# Patient Record
Sex: Male | Born: 1935 | Race: White | Hispanic: No | Marital: Married
Health system: Midwestern US, Community
[De-identification: ages and names within clinical notes are randomized; demographics above are authoritative.]

## PROBLEM LIST (undated history)

## (undated) DIAGNOSIS — K219 Gastro-esophageal reflux disease without esophagitis: Secondary | ICD-10-CM

## (undated) DIAGNOSIS — J309 Allergic rhinitis, unspecified: Secondary | ICD-10-CM

## (undated) DIAGNOSIS — I509 Heart failure, unspecified: Secondary | ICD-10-CM

## (undated) DIAGNOSIS — E7800 Pure hypercholesterolemia, unspecified: Secondary | ICD-10-CM

## (undated) DIAGNOSIS — I10 Essential (primary) hypertension: Secondary | ICD-10-CM

## (undated) DIAGNOSIS — J189 Pneumonia, unspecified organism: Secondary | ICD-10-CM

## (undated) DIAGNOSIS — I4891 Unspecified atrial fibrillation: Secondary | ICD-10-CM

## (undated) DIAGNOSIS — M1990 Unspecified osteoarthritis, unspecified site: Secondary | ICD-10-CM

## (undated) DIAGNOSIS — K759 Inflammatory liver disease, unspecified: Secondary | ICD-10-CM

## (undated) DIAGNOSIS — N289 Disorder of kidney and ureter, unspecified: Secondary | ICD-10-CM

## (undated) DIAGNOSIS — N189 Chronic kidney disease, unspecified: Secondary | ICD-10-CM

## (undated) DIAGNOSIS — I714 Abdominal aortic aneurysm, without rupture, unspecified: Secondary | ICD-10-CM

## (undated) DIAGNOSIS — I7143 Infrarenal abdominal aortic aneurysm, without rupture: Secondary | ICD-10-CM

## (undated) DIAGNOSIS — R1011 Right upper quadrant pain: Secondary | ICD-10-CM

## (undated) HISTORY — DX: Allergic rhinitis, unspecified: J30.9

## (undated) HISTORY — DX: Essential (primary) hypertension: I10

## (undated) HISTORY — PX: TONSILLECTOMY: SUR1361

## (undated) HISTORY — DX: Pneumonia, unspecified organism: J18.9

## (undated) HISTORY — PX: PROSTATE SURGERY: SHX751

## (undated) HISTORY — DX: Unspecified atrial fibrillation: I48.91

## (undated) HISTORY — PX: APPENDECTOMY: SHX54

## (undated) HISTORY — DX: Pure hypercholesterolemia, unspecified: E78.00

---

## 1998-04-26 ENCOUNTER — Inpatient Hospital Stay (HOSPITAL_COMMUNITY): Admission: EM | Admit: 1998-04-26 | Discharge: 1998-04-27 | Payer: Self-pay | Admitting: Emergency Medicine

## 1998-05-20 ENCOUNTER — Ambulatory Visit (HOSPITAL_COMMUNITY): Admission: RE | Admit: 1998-05-20 | Discharge: 1998-05-20 | Payer: Self-pay | Admitting: Gastroenterology

## 1998-07-14 ENCOUNTER — Ambulatory Visit (HOSPITAL_COMMUNITY): Admission: RE | Admit: 1998-07-14 | Discharge: 1998-07-14 | Payer: Self-pay | Admitting: Gastroenterology

## 2000-06-22 ENCOUNTER — Ambulatory Visit (HOSPITAL_COMMUNITY): Admission: RE | Admit: 2000-06-22 | Discharge: 2000-06-22 | Payer: Self-pay | Admitting: Gastroenterology

## 2002-02-05 ENCOUNTER — Encounter: Payer: Self-pay | Admitting: *Deleted

## 2002-02-05 ENCOUNTER — Emergency Department (HOSPITAL_COMMUNITY): Admission: EM | Admit: 2002-02-05 | Discharge: 2002-02-05 | Payer: Self-pay | Admitting: *Deleted

## 2002-07-03 ENCOUNTER — Ambulatory Visit (HOSPITAL_COMMUNITY): Admission: RE | Admit: 2002-07-03 | Discharge: 2002-07-03 | Payer: Self-pay | Admitting: Family Medicine

## 2002-07-03 ENCOUNTER — Encounter: Payer: Self-pay | Admitting: Family Medicine

## 2002-08-06 ENCOUNTER — Encounter: Payer: Self-pay | Admitting: Urology

## 2002-08-09 ENCOUNTER — Encounter: Admission: RE | Admit: 2002-08-09 | Discharge: 2002-08-09 | Payer: Self-pay | Admitting: Family Medicine

## 2002-08-09 ENCOUNTER — Encounter: Payer: Self-pay | Admitting: Family Medicine

## 2002-08-13 ENCOUNTER — Inpatient Hospital Stay (HOSPITAL_COMMUNITY): Admission: RE | Admit: 2002-08-13 | Discharge: 2002-08-15 | Payer: Self-pay | Admitting: Urology

## 2002-08-13 ENCOUNTER — Encounter (INDEPENDENT_AMBULATORY_CARE_PROVIDER_SITE_OTHER): Payer: Self-pay | Admitting: Specialist

## 2002-12-02 ENCOUNTER — Encounter: Payer: Self-pay | Admitting: Urology

## 2002-12-02 ENCOUNTER — Ambulatory Visit (HOSPITAL_COMMUNITY): Admission: RE | Admit: 2002-12-02 | Discharge: 2002-12-02 | Payer: Self-pay | Admitting: Urology

## 2002-12-12 ENCOUNTER — Observation Stay (HOSPITAL_COMMUNITY): Admission: RE | Admit: 2002-12-12 | Discharge: 2002-12-13 | Payer: Self-pay | Admitting: Urology

## 2002-12-16 ENCOUNTER — Ambulatory Visit (HOSPITAL_COMMUNITY): Admission: RE | Admit: 2002-12-16 | Discharge: 2002-12-16 | Payer: Self-pay | Admitting: Radiology

## 2002-12-16 ENCOUNTER — Encounter: Payer: Self-pay | Admitting: Urology

## 2003-03-31 ENCOUNTER — Encounter: Payer: Self-pay | Admitting: *Deleted

## 2003-03-31 ENCOUNTER — Emergency Department (HOSPITAL_COMMUNITY): Admission: EM | Admit: 2003-03-31 | Discharge: 2003-03-31 | Payer: Self-pay | Admitting: Emergency Medicine

## 2003-04-08 ENCOUNTER — Inpatient Hospital Stay (HOSPITAL_COMMUNITY): Admission: RE | Admit: 2003-04-08 | Discharge: 2003-04-12 | Payer: Self-pay | Admitting: Urology

## 2003-04-08 ENCOUNTER — Encounter (INDEPENDENT_AMBULATORY_CARE_PROVIDER_SITE_OTHER): Payer: Self-pay

## 2003-05-30 ENCOUNTER — Encounter: Admission: RE | Admit: 2003-05-30 | Discharge: 2003-05-30 | Payer: Self-pay | Admitting: Family Medicine

## 2003-05-30 ENCOUNTER — Encounter: Payer: Self-pay | Admitting: Family Medicine

## 2003-07-03 ENCOUNTER — Encounter: Admission: RE | Admit: 2003-07-03 | Discharge: 2003-08-11 | Payer: Self-pay | Admitting: Occupational Medicine

## 2003-08-26 ENCOUNTER — Ambulatory Visit (HOSPITAL_COMMUNITY): Admission: RE | Admit: 2003-08-26 | Discharge: 2003-08-26 | Payer: Self-pay | Admitting: Urology

## 2003-08-26 ENCOUNTER — Encounter: Payer: Self-pay | Admitting: Urology

## 2003-10-28 ENCOUNTER — Ambulatory Visit (HOSPITAL_COMMUNITY): Admission: RE | Admit: 2003-10-28 | Discharge: 2003-10-28 | Payer: Self-pay | Admitting: Gastroenterology

## 2003-12-15 ENCOUNTER — Encounter: Admission: RE | Admit: 2003-12-15 | Discharge: 2003-12-15 | Payer: Self-pay | Admitting: Family Medicine

## 2004-01-08 ENCOUNTER — Emergency Department (HOSPITAL_COMMUNITY): Admission: EM | Admit: 2004-01-08 | Discharge: 2004-01-08 | Payer: Self-pay | Admitting: Emergency Medicine

## 2004-01-27 ENCOUNTER — Ambulatory Visit (HOSPITAL_COMMUNITY): Admission: RE | Admit: 2004-01-27 | Discharge: 2004-01-27 | Payer: Self-pay | Admitting: Hematology & Oncology

## 2004-01-27 ENCOUNTER — Encounter (INDEPENDENT_AMBULATORY_CARE_PROVIDER_SITE_OTHER): Payer: Self-pay | Admitting: Specialist

## 2004-02-11 ENCOUNTER — Ambulatory Visit (HOSPITAL_COMMUNITY): Admission: RE | Admit: 2004-02-11 | Discharge: 2004-02-11 | Payer: Self-pay | Admitting: Hematology & Oncology

## 2005-02-09 ENCOUNTER — Ambulatory Visit: Payer: Self-pay | Admitting: Hematology & Oncology

## 2006-03-16 ENCOUNTER — Observation Stay (HOSPITAL_COMMUNITY): Admission: EM | Admit: 2006-03-16 | Discharge: 2006-03-17 | Payer: Self-pay | Admitting: Emergency Medicine

## 2006-06-12 ENCOUNTER — Inpatient Hospital Stay (HOSPITAL_COMMUNITY): Admission: AD | Admit: 2006-06-12 | Discharge: 2006-06-13 | Payer: Self-pay | Admitting: Interventional Cardiology

## 2007-03-02 ENCOUNTER — Encounter: Admission: RE | Admit: 2007-03-02 | Discharge: 2007-03-02 | Payer: Self-pay | Admitting: Family Medicine

## 2007-09-03 ENCOUNTER — Inpatient Hospital Stay (HOSPITAL_COMMUNITY): Admission: EM | Admit: 2007-09-03 | Discharge: 2007-09-04 | Payer: Self-pay | Admitting: Emergency Medicine

## 2007-11-22 ENCOUNTER — Emergency Department (HOSPITAL_COMMUNITY): Admission: EM | Admit: 2007-11-22 | Discharge: 2007-11-22 | Payer: Self-pay | Admitting: Emergency Medicine

## 2009-01-26 ENCOUNTER — Ambulatory Visit: Payer: Self-pay | Admitting: Hematology & Oncology

## 2009-01-27 ENCOUNTER — Ambulatory Visit (HOSPITAL_BASED_OUTPATIENT_CLINIC_OR_DEPARTMENT_OTHER): Admission: RE | Admit: 2009-01-27 | Discharge: 2009-01-27 | Payer: Self-pay | Admitting: Hematology & Oncology

## 2009-01-27 ENCOUNTER — Ambulatory Visit: Payer: Self-pay | Admitting: Diagnostic Radiology

## 2009-01-27 LAB — CBC WITH DIFFERENTIAL (CANCER CENTER ONLY)
BASO#: 0.1 10*3/uL (ref 0.0–0.2)
Eosinophils Absolute: 0.2 10*3/uL (ref 0.0–0.5)
HGB: 14.3 g/dL (ref 13.0–17.1)
LYMPH#: 1.7 10*3/uL (ref 0.9–3.3)
MCH: 32.7 pg (ref 28.0–33.4)
NEUT#: 3.9 10*3/uL (ref 1.5–6.5)
RBC: 4.37 10*6/uL (ref 4.20–5.70)

## 2009-01-29 LAB — COMPREHENSIVE METABOLIC PANEL
Albumin: 4 g/dL (ref 3.5–5.2)
CO2: 22 mEq/L (ref 19–32)
Calcium: 9.3 mg/dL (ref 8.4–10.5)
Glucose, Bld: 90 mg/dL (ref 70–99)
Potassium: 4.5 mEq/L (ref 3.5–5.3)
Sodium: 135 mEq/L (ref 135–145)
Total Protein: 7.6 g/dL (ref 6.0–8.3)

## 2009-01-29 LAB — SPEP & IFE WITH QIG
Albumin ELP: 53.6 % — ABNORMAL LOW (ref 55.8–66.1)
Beta 2: 2.6 % — ABNORMAL LOW (ref 3.2–6.5)
Beta Globulin: 5.1 % (ref 4.7–7.2)
Gamma Globulin: 26.7 % — ABNORMAL HIGH (ref 11.1–18.8)
IgA: 34 mg/dL — ABNORMAL LOW (ref 68–378)
IgG (Immunoglobin G), Serum: 2950 mg/dL — ABNORMAL HIGH (ref 694–1618)

## 2009-01-29 LAB — KAPPA/LAMBDA LIGHT CHAINS: Kappa free light chain: 1.61 mg/dL (ref 0.33–1.94)

## 2009-01-29 LAB — LACTATE DEHYDROGENASE: LDH: 199 U/L (ref 94–250)

## 2009-02-03 LAB — UIFE/LIGHT CHAINS/TP QN, 24-HR UR
Alpha 1, Urine: DETECTED — AB
Alpha 2, Urine: DETECTED — AB
Beta, Urine: DETECTED — AB
Free Kappa Lt Chains,Ur: 2.75 mg/dL — ABNORMAL HIGH (ref 0.04–1.51)
Free Kappa/Lambda Ratio: 0.26 ratio — ABNORMAL LOW (ref 0.46–4.00)
Total Protein, Urine: 22 mg/dL

## 2009-02-26 ENCOUNTER — Encounter: Admission: RE | Admit: 2009-02-26 | Discharge: 2009-02-26 | Payer: Self-pay | Admitting: Family Medicine

## 2009-03-17 ENCOUNTER — Ambulatory Visit: Payer: Self-pay | Admitting: Hematology & Oncology

## 2009-03-17 ENCOUNTER — Ambulatory Visit (HOSPITAL_COMMUNITY): Admission: RE | Admit: 2009-03-17 | Discharge: 2009-03-17 | Payer: Self-pay | Admitting: Hematology & Oncology

## 2009-03-17 ENCOUNTER — Encounter: Payer: Self-pay | Admitting: Hematology & Oncology

## 2009-04-29 ENCOUNTER — Ambulatory Visit: Payer: Self-pay | Admitting: Hematology & Oncology

## 2010-01-13 ENCOUNTER — Ambulatory Visit: Payer: Self-pay | Admitting: Hematology & Oncology

## 2010-10-16 ENCOUNTER — Encounter: Admission: RE | Admit: 2010-10-16 | Discharge: 2010-10-16 | Payer: Self-pay | Admitting: Family Medicine

## 2010-12-11 ENCOUNTER — Encounter: Payer: Self-pay | Admitting: Family Medicine

## 2011-03-02 LAB — DIFFERENTIAL
Basophils Absolute: 0 10*3/uL (ref 0.0–0.1)
Eosinophils Relative: 4 % (ref 0–5)
Lymphocytes Relative: 24 % (ref 12–46)
Lymphs Abs: 1.6 10*3/uL (ref 0.7–4.0)
Monocytes Absolute: 0.7 10*3/uL (ref 0.1–1.0)

## 2011-03-02 LAB — CBC
HCT: 40.4 % (ref 39.0–52.0)
Hemoglobin: 14 g/dL (ref 13.0–17.0)
MCV: 98.1 fL (ref 78.0–100.0)
RDW: 13.6 % (ref 11.5–15.5)

## 2011-04-05 NOTE — Op Note (Signed)
NAMEMAXFIELD, GILDERSLEEVE               ACCOUNT NO.:  [id number]   MEDICAL RECORD NO.:  [id number]          PATIENT TYPE:  AMB   LOCATION:  OMED                         FACILITY:  [facility]   PHYSICIAN:  Rose Phi. Myna Hidalgo, M.D. DATE OF BIRTH:  20-Jun-1931   DATE OF PROCEDURE:  03/17/2009  DATE OF DISCHARGE:                               OPERATIVE REPORT   NATURE OF PROCEDURE:  Left posterior iliac crest bone marrow biopsy and  aspirate.   Mr. Durrell was brought to short stay unit.  He had an IV placed without  difficulty.  His Mallampati score was 2.  His ASA class was 1.   He received 2.5 mg of Versed and 25 mg of Demerol for IV sedation.   The left posterior iliac crest region was prepped and draped in sterile  fashion.  Ten mL of 2% lidocaine was infiltrated under the skin down to  the periosteum.   A #11 scalpel was used to make an incision into the skin.  We obtained a  good bone marrow aspirate without difficulty.  We then obtained a  specimen for flow cytometry and cytogenetics.   Then used the Jamshidi biopsy needle.  We obtained an excellent biopsy  core without complications.   The patient tolerated the procedure well.  There were no complications.      Rose Phi. Myna Hidalgo, M.D.  Electronically Signed     PRE/MEDQ  D:  03/17/2009  T:  03/17/2009  Job:  [id number]

## 2011-04-05 NOTE — Discharge Summary (Signed)
Trevor Burch, Trevor Burch               ACCOUNT NO.:  [id number]   MEDICAL RECORD NO.:  [id number]          PATIENT TYPE:  INP   LOCATION:  4707                         FACILITY:  [facility]   PHYSICIAN:  Jake Bathe, MD      DATE OF BIRTH:  [birth year]   DATE OF ADMISSION:  09/03/2007  DATE OF DISCHARGE:  09/04/2007                               DISCHARGE SUMMARY   DISCHARGE DIAGNOSES:  1. Chest pain resolved.  2. Hypertension.  3. Reflux disease.  4. Renal insufficiency.  5. Long-term medication use.   HOSPITAL COURSE:  Trevor Burch is a 77 year old male patient who was  admitted to [facility] on September 03, 2007 with chest discomfort.  He  stated that the day before his admission he had chest discomfort that  spontaneously went away, that he was awakened about 3 o'clock in the  morning with chest pain and increased pulse rate, fast but regular.  He  states it also feels like sometimes food gets stuck in his throat.  It  is my impression that the patient's discomfort is unlike any he has had  in the past.   He remained in the hospital overnight and ruled out for myocardial  infarction.  Interestingly, his myoglobins were elevated, but his CK-MBs  were not.  He does have a history of weightlifting and has worked out  recently.   We felt after watching him for a 24-hour period that he could go home  and come back in as an outpatient.  Please note the patient does have  renal insufficiency, and in the past we have tried to avoid cardiac  catheterization for this reason.  On this admission, his BUN is 49,  creatinine 2.43.  Other lab work studies include hemoglobin 13.9,  hematocrit 40.2, platelets 210, sodium 131, potassium 3.7, lipase 42.   Chest x-ray:  No active disease.   EKG:  Normal sinus rhythm, rate 60 with no specific ST-T wave changes.   DISCHARGE MEDICATIONS:  1. Amlodipine 5 mg twice a day.  2. Atenolol 25 mg a day.  3. Zegerid daily.  4. Hydrochlorothiazide 25 mg  daily.  5. Sublingual nitroglycerin p.r.n. chest pain.   Increase activity slowly.  Follow up with Dr. Katrinka Blazing for a stress test on  September 17, 2007 at 12 p.m.  He is not to eat 12 hours before the test.  He is not take atenolol within 24 hours of the test.   The patient is discharged to home in stable and improved condition.      Guy Franco, P.A.      Jake Bathe, MD  Electronically Signed    LB/MEDQ  D:  09/04/2007  T:  09/04/2007  Job:  [id number]   cc:   Lyn Records, M.D.

## 2011-04-05 NOTE — Consult Note (Signed)
NAMEFAUSTINO, Trevor Burch NO.:  [id number]   MEDICAL RECORD NO.:  [id number]          PATIENT TYPE:  INP   LOCATION:  1824                         FACILITY:  [facility]   PHYSICIAN:  Jake Bathe, MD      DATE OF BIRTH:  July 19, 1931   DATE OF CONSULTATION:  09/03/2007  DATE OF DISCHARGE:                                 CONSULTATION   PRIMARY CARDIOLOGIST:  Dr. Verdis Prime.   PRIMARY CARE PHYSICIAN:  Dr. Lanell Persons.   REASON FOR CONSULTATION:  [name] is being seen at the request of Dr.  Read Drivers for the evaluation of chest pain.   HISTORY OF PRESENT ILLNESS:  [name] is a 77 year old male patient of  Dr. Verdis Prime with no known prior history of obstructive coronary  disease who was admitted with chest pain.  Approximately 10 years ago he  had a cath by Dr. Daisy Floro which was apparently normal.  Since that time  he had a stress Cardiolite in April of last year which showed no  ischemia.  The episode last year was similar to this episode.   He describes his chest pain as a pulling sensation, almost like a muscle  pull that was substernal with no radiation.  It occurred late yesterday  and spontaneously went away.  He was then awoken at about 3 a.m. with  palpitations, increased pulse rate and this sensation.  He got out of  bed and did feel slightly dizzy.  Currently is chest pain free.   He has yearly endoscopies secondary to his GERD and has been told he has  no strictures.  He states that it feels like sometimes food gets stuck  in his throat.  He denies any recent nausea, vomiting, cough, diarrhea,  skin or hair changes, syncope, edema, weight changes.  He works out  quite heavily with weightlifting.   PAST MEDICAL HISTORY:  1. GERD.  2. Hypertension.  3. Chronic renal insufficiency secondary to hydronephrosis and      dehydration - creatinine 2.5.  4. History of pectoral muscle tear.   ALLERGIES:  No known drug allergies.   MEDICATIONS:  1.  Amlodipine 5 mg twice a day (will clarify this as this is usually a      once-a-day medicine).  2. Atenolol 25 mg once a day.  3. Zegerid 40 mg once a day.  4. Hydrochlorothiazide 25 mg once a day.   FAMILY HISTORY:  Dad died of liver cancer secondary to alcohol abuse.  Mom died in her 62s secondary to old age.  No early family history of  coronary artery disease.   SOCIAL HISTORY:  Occasionally drinks a glass of wine.  Drinks caffeine  but no illicit drug use.  He does not smoke.  He exercises aerobics and  strength training.  He is married with 5 children.  He lives with his  wife who is a IT trainer.  Is retired [place] [employer].   REVIEW OF SYSTEMS:  Unless specified above, all other 12 review of  systems negative.   PHYSICAL EXAMINATION:  VITAL SIGNS:  Blood  pressure 165/95, pulse 90 now  60, respirations 20, temperature 98.  GENERAL:  Alert and oriented x3 in no acute distress, lying in bed with  his wife, pleasant.  EYES:  Well-perfused conjunctivae.  EOMI.  HEENT:  Moist mucous membranes.  NECK:  No carotid bruits.  No thyromegaly.  CARDIOVASCULAR:  Regular rate and rhythm.  No murmurs, rubs or gallops.  LUNGS:  Clear to auscultation bilaterally.  Normal respiratory effort.  ABDOMEN:  Soft, nontender, normoactive bowel sounds.  No epigastric  tenderness.  EXTREMITIES:  No clubbing, cyanosis or edema.  Normal distal pulses.  NEUROLOGIC:  Nonfocal.  No tremors.  SKIN:  Warm, dry and intact.   LABORATORY DATA:  Point of care cardiac biomarkers show myoglobin  greater than 500, 483, 455 with CK-MB of 6.8, 7.2, 5.7 and all three  troponins have been normal, less than 0.05.  White count 6.5, hemoglobin  13.9, hematocrit 40.2, platelets 210,000.  Chemistry:  Sodium 131,  potassium 3.7, BUN 49, creatinine 2.4, glucose 105, lipase 42.   Chest x-ray personally reviewed showed no acute air space disease.   EKG personally reviewed showed normal sinus rhythm, rate 60 with no  specific  changes.   ASSESSMENT AND PLAN:  A 77 year old male with hypertension,  gastroesophageal reflux disease, renal insufficiency with possible acute  coronary syndrome.   1. Possible acute coronary syndrome.  Will continue to cycle cardiac      enzymes.  He has received aspirin, nitroglycerin as needed.  Check      EKG in a.m.  At this point, he is chest pain free and      hemodynamically stable.  If cardiac enzymes continue to be normal,      will likely discharge with outpatient stress test followup.  2. Hypertension.  Continue to monitor on current medications.  Clarify      amlodipine dosage.  3. Gastroesophageal reflux disease.  On Zegerid, which is omeprazole      and sodium bicarbonate.  Yearly endoscopy showed no signs of      stricture.  4. Renal insufficiency, history of hydronephrosis.  Creatinine 2.5.      Will be very prudent with intravenous dye use.      Jake Bathe, MD  Electronically Signed     MCS/MEDQ  D:  09/03/2007  T:  09/03/2007  Job:  [id number]   cc:   Paula Libra, MD  Lyn Records, M.D.

## 2011-04-08 NOTE — H&P (Signed)
Trevor Burch, Trevor Burch               ACCOUNT NO.:  [id number]   MEDICAL RECORD NO.:  [id number]          PATIENT TYPE:  INP   LOCATION:  1827                         FACILITY:  [facility]   PHYSICIAN:  Tylene Fantasia, PA    DATE OF BIRTH:  January 19, 1931   DATE OF ADMISSION:  03/16/2006  DATE OF DISCHARGE:                                HISTORY & PHYSICAL   PRIMARY CARE PHYSICIAN:  Vikki Ports, M.D.   CHIEF COMPLAINT:  Chest discomfort.   HISTORY OF PRESENT ILLNESS:  Trevor Burch is a 77 year old Caucasian male who  was in his usual state of health until 6:45 this morning when he was  awakened from his sleep with crampy mid to substernal chest discomfort which  is still ongoing.  He denies shortness of breath associated with his chest  discomfort, however, admits to diaphoresis.  He also denies nausea,  vomiting, dizziness, and syncope.  Trevor Burch has a history of acid reflux  and took his blood pressure medications as well as Nexium 40 mg orally.  He  was transported to the [facility] Emergency [facility] by EMS.  In route, he  was given sublingual nitroglycerin x1, plus four 81 mg aspirin by EMS  without any change in his chest discomfort.  He now admits to a headache and  nausea secondary to receiving the nitroglycerin.  Upon arrival to the  emergency department, a 12-lead EKG was obtained and revealed sinus  bradycardia with first degree AV block and a ventricular rate of 57 beats  per minute.  This is consistent with a previous EKG that was obtained in  February 2005.  In November 2003, he had a 2D echocardiogram that revealed  normal systolic size and function with an EF of 75%.  The echocardiogram  also revealed mild aortic regurgitation.  He also had a negative exercise  stress test in July 2003.  Per the patient, he had a cardiac catheterization  with normal coronaries greater than 10 years ago with Dr. Soundra Pilon; however,  there were no records in the hospital system of this  cardiac  catheterization.  Initial point of care markers x1 were negative with the  exception of an elevated myoglobin at 357.   PAST MEDICAL HISTORY:  1.  Hypertension.  2.  GERD.  3.  Pectoral muscle tear.  4.  Kidney stones in the distant past.   ALLERGIES:  No known drug allergies.   MEDICATIONS:  1.  Amlodipine 5 mg daily.  2.  Atenolol 25 mg daily.  3.  Nexium 40 mg daily.  4.  HCTZ 25 mg one half tablet daily.   PAST SURGICAL HISTORY:  1.  Status post T&A.  2.  Status post kidney and bladder surgery.   FAMILY HISTORY:  1.  Insignificant for early coronary artery disease.  Father deceased age      94, lung cancer, tobacco abuse.  2.  Mother deceased at age 48 old age, tobacco abuse.  3.  Five adult children, all healthy.   SOCIAL HISTORY:  Married with five adult children.  Lives with his  wife.  He  is retired from the [employer]. 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  He denies tobacco or illicit drug use.  However, admits to occasional alcohol use (2-3 times per week with dinner).  He exercises for one hour daily doing aerobic and strength training.  He  also walks his dog.   REVIEW OF SYSTEMS:  All of the systems reviewed are negative other than what  is stated in the HPI.   PHYSICAL EXAMINATION:  GENERAL APPEARANCE:  A 77 year old male, pleasant and  cooperative.  NAD.  VITAL SIGNS:  Temperature 97.0, blood pressure 134/70, pulse 56,  respirations 20, O2 saturations 97% over 2 liters.  HEENT:  Unremarkable.  NECK:  Supple without JVD or carotid bruits bilaterally.  Carotid upstrokes  2+/2.  LUNGS:  Breath sounds are equal and clear to auscultation bilaterally.  HEART:  Regular rate and rhythm.  S1, S2 normal with a positive S4.  No  murmurs, gallops, clicks or rubs auscultated.  ABDOMEN:  Soft, nontender, nondistended with active bowel sounds.  No masses  or bruits bilaterally.  EXTREMITIES:  Right lower extremity edema.  No  edema on the left.  DP and PT  pulses 2+/2 bilaterally.  SKIN:  Warm and dry without rashes or lesions.  NEUROLOGICAL:  Alert and oriented x3.  No focal deficits.  PSYCHIATRIC:  Normal mood and affect.   LABORATORY DATA:  Sodium 136, potassium 4.3, chloride 102, CO2 24, BUN 53,  creatinine 2.6, glucose 103.  White blood count 5.4.  Hemoglobin 13.1,  platelets 201,000, hematocrit 37.8, PT 13.6, INR 1.0, PTT 27.  Point of care  markers x2.  Myoglobin 357 and 283, respectively.  CK MB 5.5 and 4.3,  respectively.  Troponin I less than 0.05 and less than 0.05, respectively.  Serial cardiac enzymes x1.  CK total 217.  CK MB 9.4, troponin I less than  0.01.  Magnesium 2.0. UA pending, BNP pending.  Chest x-ray, 2 view, NAD,  SL, PROM, pulmonary vasculature.  Mild CE.   ASSESSMENT:  1.  Chest discomfort, questionable cause.  2.  Hypertension.  3.  Renal insufficiency.   PLAN:  1.  Admit to cardiac telemetry unit under the service of Dr. Verdis Prime      with diagnosis of chest pain.  2.  Hold Atenolol.  3.  Hold HCTZ secondary to renal insufficiency.  4.  Start EC aspirin 325 mg tomorrow.  5.  Obtain serial cardiac enzymes including CK total, CK MB and troponin I      now and q.8 h x2.  Continue IV nitroglycerin; however, may discontinue      if serial cardiac enzymes are negative.  6.  Stress Cardiolite is scheduled for the a.m. of March 17, 2006.  7.  N.p.o. after midnight tonight, March 16, 2006.  8.  Subcu Lovenox q.12 h per pharmacy protocol.  9.  IV hydration 0.9% normal saline at 70 ml per hour beginning at 10      o'clock a.m. for 10 hours.  Then KVL.  10. The patient was interviewed and examined by Dr. Katrinka Blazing who assisted in      the assessment and plan for the patient.  Dr.  Katrinka Blazing is in agreement      with the orders that have been written.      Tylene Fantasia, [place]     RDM/MEDQ  D:  03/16/2006  T:  03/16/2006  Job:  [id number]  cc:   Vikki Ports, M.D.  Fax:  [phone]

## 2011-04-08 NOTE — Procedures (Signed)
[facility]  Patient:    Trevor Burch                       MRN: [id number] Proc. Date: 06/22/00 Adm. Date:  [id number] Attending:  Orland Mustard CC:         Vikki Ports, M.D.   Procedure Report  PROCEDURE:  Esophagogastroduodenoscopy.  ENDOSCOPIST:  Llana Aliment. Edwards, M.D.  MEDICATIONS:  Hurricaine spray, fentanyl 50 mcg and Versed 4 mg IV.  INDICATIONS:  Esophageal reflux that has been somewhat refractory to proton pump inhibitor.  DESCRIPTION OF PROCEDURE:  The procedure had been explained to the patient and consent obtained.  With the patient in the left lateral decubitus position, the Olympus video endoscope was inserted blindly and advanced under direct visualization.  The stomach was entered, the pylorus identified and passed. The duodenum including the bulb and second portion were unremarkable.  The scope was withdrawn back in the stomach.  The antrum and body were normal. The fundus and cardia were seen well on retroflexed view and appeared normal. There was a hiatal hernia with a patent GE junction.  The distal esophagus was endoscopically normal and no ulcerations, strictures, etc.  The proximal esophagus was also normal.  The scope was withdrawn.  The patient tolerated the procedure well.  ASSESSMENT:  Hiatal hernia with gastroesophageal reflux disease.  PLAN:  Will continue on Nexium and see back in the office in six months. DD:  06/22/00 TD:  06/23/00 Job: [id number]

## 2011-08-10 LAB — DIFFERENTIAL
Basophils Relative: 0
Eosinophils Absolute: 0
Eosinophils Relative: 0
Monocytes Relative: 9
Neutrophils Relative %: 84 — ABNORMAL HIGH

## 2011-08-10 LAB — URINALYSIS, ROUTINE W REFLEX MICROSCOPIC
Glucose, UA: NEGATIVE
Specific Gravity, Urine: 1.013

## 2011-08-10 LAB — CBC
RBC: 4.42
WBC: 10

## 2011-08-10 LAB — COMPREHENSIVE METABOLIC PANEL
ALT: 28
Alkaline Phosphatase: 64
CO2: 23
Chloride: 101
GFR calc non Af Amer: 22 — ABNORMAL LOW
Glucose, Bld: 163 — ABNORMAL HIGH
Potassium: 4.5
Sodium: 134 — ABNORMAL LOW

## 2011-08-10 LAB — URINE MICROSCOPIC-ADD ON

## 2011-08-10 LAB — POCT CARDIAC MARKERS
Myoglobin, poc: 457
Operator id: 4295

## 2011-09-01 LAB — CK TOTAL AND CKMB (NOT AT ARMC)
CK, MB: 9.6 — ABNORMAL HIGH
Relative Index: 6.8 — ABNORMAL HIGH
Total CK: 141

## 2011-09-01 LAB — DIFFERENTIAL
Basophils Absolute: 0
Eosinophils Relative: 3
Lymphocytes Relative: 21
Lymphs Abs: 1.3
Neutro Abs: 4.3
Neutrophils Relative %: 66

## 2011-09-01 LAB — POCT CARDIAC MARKERS
CKMB, poc: 5.7
CKMB, poc: 6.8
Myoglobin, poc: 455
Myoglobin, poc: >500
Operator id: 294511
Troponin i, poc: <0.05
Troponin i, poc: <0.05

## 2011-09-01 LAB — LIPID PANEL
LDL Cholesterol: 130 — ABNORMAL HIGH
Total CHOL/HDL Ratio: 5.4
VLDL: 19

## 2011-09-01 LAB — CBC
HCT: 40.2
MCHC: 34.7
MCV: 97.4
RBC: 4.13 — ABNORMAL LOW

## 2011-09-01 LAB — COMPREHENSIVE METABOLIC PANEL
AST: 22
BUN: 49 — ABNORMAL HIGH
CO2: 24
Calcium: 9.2
Creatinine, Ser: 2.43 — ABNORMAL HIGH
GFR calc Af Amer: 32 — ABNORMAL LOW
GFR calc non Af Amer: 26 — ABNORMAL LOW
Total Bilirubin: 0.8

## 2011-09-01 LAB — LIPASE, BLOOD: Lipase: 42

## 2012-08-30 ENCOUNTER — Other Ambulatory Visit: Payer: Self-pay

## 2013-03-12 ENCOUNTER — Other Ambulatory Visit: Payer: Self-pay | Admitting: Family Medicine

## 2013-03-12 DIAGNOSIS — E049 Nontoxic goiter, unspecified: Secondary | ICD-10-CM

## 2013-03-13 ENCOUNTER — Ambulatory Visit
Admission: RE | Admit: 2013-03-13 | Discharge: 2013-03-13 | Disposition: A | Discharge status: Home / Self Care | Payer: BC Managed Care – PPO | Source: Ambulatory Visit | Attending: Family Medicine | Admitting: Family Medicine

## 2013-03-13 DIAGNOSIS — E049 Nontoxic goiter, unspecified: Secondary | ICD-10-CM

## 2013-09-30 ENCOUNTER — Encounter (HOSPITAL_COMMUNITY): Payer: Self-pay | Admitting: Emergency Medicine

## 2013-09-30 ENCOUNTER — Inpatient Hospital Stay (HOSPITAL_COMMUNITY)
Admission: EM | Admit: 2013-09-30 | Discharge: 2013-10-02 | DRG: 194 | Disposition: A | Discharge status: Home / Self Care | Payer: BC Managed Care – PPO | Attending: Internal Medicine | Admitting: Internal Medicine

## 2013-09-30 ENCOUNTER — Emergency Department (HOSPITAL_COMMUNITY): Payer: BC Managed Care – PPO

## 2013-09-30 DIAGNOSIS — N184 Chronic kidney disease, stage 4 (severe): Secondary | ICD-10-CM | POA: Diagnosis present

## 2013-09-30 DIAGNOSIS — I129 Hypertensive chronic kidney disease with stage 1 through stage 4 chronic kidney disease, or unspecified chronic kidney disease: Secondary | ICD-10-CM | POA: Diagnosis present

## 2013-09-30 DIAGNOSIS — I4891 Unspecified atrial fibrillation: Secondary | ICD-10-CM | POA: Diagnosis present

## 2013-09-30 DIAGNOSIS — K219 Gastro-esophageal reflux disease without esophagitis: Secondary | ICD-10-CM | POA: Diagnosis present

## 2013-09-30 DIAGNOSIS — Z7901 Long term (current) use of anticoagulants: Secondary | ICD-10-CM

## 2013-09-30 DIAGNOSIS — I2510 Atherosclerotic heart disease of native coronary artery without angina pectoris: Secondary | ICD-10-CM | POA: Diagnosis present

## 2013-09-30 DIAGNOSIS — J189 Pneumonia, unspecified organism: Principal | ICD-10-CM | POA: Diagnosis present

## 2013-09-30 DIAGNOSIS — I10 Essential (primary) hypertension: Secondary | ICD-10-CM | POA: Diagnosis present

## 2013-09-30 DIAGNOSIS — R079 Chest pain, unspecified: Secondary | ICD-10-CM

## 2013-09-30 DIAGNOSIS — L02419 Cutaneous abscess of limb, unspecified: Secondary | ICD-10-CM | POA: Diagnosis present

## 2013-09-30 HISTORY — DX: Gastro-esophageal reflux disease without esophagitis: K21.9

## 2013-09-30 HISTORY — DX: Disorder of kidney and ureter, unspecified: N28.9

## 2013-09-30 LAB — CBC WITH DIFFERENTIAL/PLATELET
Basophils Absolute: 0 10*3/uL (ref 0.0–0.1)
Basophils Relative: 0 % (ref 0–1)
Eosinophils Absolute: 0.1 10*3/uL (ref 0.0–0.7)
Eosinophils Relative: 1 % (ref 0–5)
HCT: 38.2 % — ABNORMAL LOW (ref 39.0–52.0)
Hemoglobin: 13 g/dL (ref 13.0–17.0)
Lymphocytes Relative: 15 % (ref 12–46)
Lymphs Abs: 1.2 10*3/uL (ref 0.7–4.0)
MCH: 33.5 pg (ref 26.0–34.0)
MCHC: 34 g/dL (ref 30.0–36.0)
MCV: 98.5 fL (ref 78.0–100.0)
Monocytes Absolute: 1.1 10*3/uL — ABNORMAL HIGH (ref 0.1–1.0)
Monocytes Relative: 14 % — ABNORMAL HIGH (ref 3–12)
Neutro Abs: 5.8 10*3/uL (ref 1.7–7.7)
Neutrophils Relative %: 71 % (ref 43–77)
Platelets: 187 10*3/uL (ref 150–400)
RBC: 3.88 MIL/uL — ABNORMAL LOW (ref 4.22–5.81)
RDW: 13.4 % (ref 11.5–15.5)
WBC: 8.2 10*3/uL (ref 4.0–10.5)

## 2013-09-30 LAB — BASIC METABOLIC PANEL
CO2: 22 mEq/L (ref 19–32)
Chloride: 105 mEq/L (ref 96–112)
Creatinine, Ser: 2.92 mg/dL — ABNORMAL HIGH (ref 0.50–1.35)
GFR calc Af Amer: 22 mL/min — ABNORMAL LOW (ref >90)
Sodium: 137 mEq/L (ref 135–145)

## 2013-09-30 LAB — TROPONIN I: Troponin I: <0.3 ng/mL (ref <0.30)

## 2013-09-30 MED ORDER — SODIUM CHLORIDE 0.9 % IV SOLN: 250.0000 mL | INTRAVENOUS | Status: DC | PRN

## 2013-09-30 MED ORDER — DEXTROSE 5 % IV SOLN
1.0000 g | INTRAVENOUS | Status: DC
  Administered 2013-10-01: 1 g via INTRAVENOUS
  Filled 2013-09-30 (×2): qty 10

## 2013-09-30 MED ORDER — ATENOLOL 25 MG PO TABS
25.0000 mg | ORAL_TABLET | Freq: Every day | ORAL | Status: DC
  Administered 2013-10-01 – 2013-10-02 (×2): 25 mg via ORAL
  Filled 2013-09-30 (×2): qty 1

## 2013-09-30 MED ORDER — WARFARIN - PHARMACIST DOSING INPATIENT
Freq: Every day | Status: DC
  Administered 2013-10-01: 18:00:00

## 2013-09-30 MED ORDER — ADULT MULTIVITAMIN W/MINERALS CH
1.0000 | ORAL_TABLET | Freq: Every day | ORAL | Status: DC
  Administered 2013-10-01 – 2013-10-02 (×2): 1 via ORAL
  Filled 2013-09-30 (×2): qty 1

## 2013-09-30 MED ORDER — ASPIRIN 325 MG PO TABS
325.0000 mg | ORAL_TABLET | Freq: Once | ORAL | Status: AC
  Administered 2013-09-30: 325 mg via ORAL
  Filled 2013-09-30: qty 1

## 2013-09-30 MED ORDER — WARFARIN VIDEO
Freq: Once | Status: AC
  Administered 2013-10-01: 12:00:00

## 2013-09-30 MED ORDER — DICLOFENAC SODIUM 1 % TD GEL
2.0000 g | Freq: Three times a day (TID) | TRANSDERMAL | Status: DC | PRN
  Filled 2013-09-30: qty 100

## 2013-09-30 MED ORDER — ACETAMINOPHEN 325 MG PO TABS: 650.0000 mg | ORAL_TABLET | Freq: Four times a day (QID) | ORAL | Status: DC | PRN

## 2013-09-30 MED ORDER — WARFARIN SODIUM 5 MG PO TABS
5.0000 mg | ORAL_TABLET | Freq: Once | ORAL | Status: AC
  Administered 2013-09-30: 5 mg via ORAL
  Filled 2013-09-30: qty 1

## 2013-09-30 MED ORDER — AMLODIPINE BESYLATE 5 MG PO TABS
5.0000 mg | ORAL_TABLET | Freq: Two times a day (BID) | ORAL | Status: DC
  Administered 2013-09-30 – 2013-10-02 (×4): 5 mg via ORAL
  Filled 2013-09-30 (×5): qty 1

## 2013-09-30 MED ORDER — HEPARIN BOLUS VIA INFUSION
5000.0000 [IU] | Freq: Once | INTRAVENOUS | Status: AC
  Administered 2013-09-30: 5000 [IU] via INTRAVENOUS
  Filled 2013-09-30: qty 5000

## 2013-09-30 MED ORDER — ALBUTEROL SULFATE (5 MG/ML) 0.5% IN NEBU: 2.5000 mg | INHALATION_SOLUTION | RESPIRATORY_TRACT | Status: DC | PRN

## 2013-09-30 MED ORDER — HEPARIN (PORCINE) IN NACL 100-0.45 UNIT/ML-% IJ SOLN
1400.0000 [IU]/h | INTRAMUSCULAR | Status: DC
  Administered 2013-10-01 – 2013-10-02 (×3): 1400 [IU]/h via INTRAVENOUS
  Filled 2013-09-30 (×5): qty 250

## 2013-09-30 MED ORDER — DEXTROSE 5 % IV SOLN
500.0000 mg | INTRAVENOUS | Status: DC
  Administered 2013-10-01: 500 mg via INTRAVENOUS
  Filled 2013-09-30 (×2): qty 500

## 2013-09-30 MED ORDER — SODIUM CHLORIDE 0.9 % IV SOLN
Freq: Once | INTRAVENOUS | Status: AC
  Administered 2013-09-30: 20:00:00 via INTRAVENOUS

## 2013-09-30 MED ORDER — OMEGA-3 FATTY ACIDS 1000 MG PO CAPS: 2.0000 g | ORAL_CAPSULE | Freq: Every day | ORAL | Status: DC

## 2013-09-30 MED ORDER — OMEGA-3-ACID ETHYL ESTERS 1 G PO CAPS
2.0000 g | ORAL_CAPSULE | Freq: Every day | ORAL | Status: DC
  Administered 2013-10-01 – 2013-10-02 (×2): 2 g via ORAL
  Filled 2013-09-30 (×2): qty 2

## 2013-09-30 MED ORDER — PANTOPRAZOLE SODIUM 40 MG PO TBEC
40.0000 mg | DELAYED_RELEASE_TABLET | Freq: Every day | ORAL | Status: DC
  Administered 2013-10-01 – 2013-10-02 (×3): 40 mg via ORAL
  Filled 2013-09-30 (×3): qty 1

## 2013-09-30 MED ORDER — SODIUM CHLORIDE 0.9 % IJ SOLN
3.0000 mL | Freq: Two times a day (BID) | INTRAMUSCULAR | Status: DC
  Administered 2013-10-01: 3 mL via INTRAVENOUS

## 2013-09-30 MED ORDER — CEFTRIAXONE SODIUM 1 G IJ SOLR
1.0000 g | Freq: Once | INTRAMUSCULAR | Status: AC
  Administered 2013-09-30: 1 g via INTRAVENOUS
  Filled 2013-09-30: qty 10

## 2013-09-30 MED ORDER — COENZYME Q10 50 MG PO CAPS: 1.0000 | ORAL_CAPSULE | Freq: Every day | ORAL | Status: DC

## 2013-09-30 MED ORDER — FUROSEMIDE 80 MG PO TABS
80.0000 mg | ORAL_TABLET | Freq: Two times a day (BID) | ORAL | Status: DC
  Administered 2013-09-30 – 2013-10-02 (×4): 80 mg via ORAL
  Filled 2013-09-30 (×6): qty 1

## 2013-09-30 MED ORDER — COUMADIN BOOK
Freq: Once | Status: AC
  Administered 2013-09-30: 22:00:00
  Filled 2013-09-30: qty 1

## 2013-09-30 MED ORDER — SODIUM CHLORIDE 0.9 % IJ SOLN: 3.0000 mL | INTRAMUSCULAR | Status: DC | PRN

## 2013-09-30 MED ORDER — ONDANSETRON HCL 4 MG/2ML IJ SOLN: 4.0000 mg | Freq: Three times a day (TID) | INTRAMUSCULAR | Status: AC | PRN

## 2013-09-30 MED ORDER — DEXTROSE 5 % IV SOLN
500.0000 mg | Freq: Once | INTRAVENOUS | Status: AC
  Administered 2013-09-30: 500 mg via INTRAVENOUS

## 2013-09-30 MED ORDER — PATIENT'S GUIDE TO USING COUMADIN BOOK
Freq: Once | Status: AC
  Administered 2013-09-30: 22:00:00
  Filled 2013-09-30: qty 1

## 2013-09-30 NOTE — ED Provider Notes (Signed)
CSN: [id number]     Arrival date & time 09/30/13  1625 History   First MD Initiated Contact with Patient 09/30/13 1641     Chief Complaint  Patient presents with  . Irregular Heart Beat   (Consider location/radiation/quality/duration/timing/severity/associated sxs/prior Treatment) HPI Comments: Pt is a 79 y.o. male with Pmhx as above who presents with EKG changes from PCP's office. Pt states he has occasional GERD "flares" with epigastric/chest burning, belching.  Current episode started about 1 week ago, symptoms intermittent, better with sitting upright.  2 days ago also began having SOB, globus sensation. SOB worse laying flat.  No change in chronic LE edema. No LE pain.  No fever, cough, n/v, diaphoresis, palpitations.  NO CP/AB pain currently.   Patient is a 79 y.o. male presenting with GERD and shortness of breath. The history is provided by the patient. No language interpreter was used.  Gastrophageal Reflux This is a recurrent problem. The current episode started more than 1 week ago. The problem occurs daily. The problem has not changed since onset.Associated symptoms include chest pain, abdominal pain and shortness of breath. Pertinent negatives include no headaches. Exacerbated by: laying back. Relieved by: sitting up. He has tried nothing for the symptoms. The treatment provided no relief.  Shortness of Breath Severity:  Moderate Onset quality:  Unable to specify Duration:  2 days Timing:  Constant Progression:  Waxing and waning Chronicity:  New Relieved by:  Sitting up Exacerbated by: laying flat. Ineffective treatments:  None tried Associated symptoms: abdominal pain and chest pain   Associated symptoms: no claudication, no cough, no diaphoresis, no fever, no headaches, no neck pain, no rash, no sore throat, no sputum production, no syncope, no vomiting and no wheezing   Associated symptoms comment:  Globus sensation  Abdominal pain:    Location:  Epigastric   Quality:   Burning   Severity:  Moderate   Onset quality:  Unable to specify   Duration:  1 week   Timing:  Intermittent   Progression:  Waxing and waning   Chronicity:  Recurrent Risk factors: no recent alcohol use, no hx of PE/DVT, no oral contraceptive use, no prolonged immobilization, no recent surgery and no tobacco use     Past Medical History  Diagnosis Date  . Kidney function abnormal     has approx. 20% function - stablized x several years  . GERD (gastroesophageal reflux disease)    Past Surgical History  Procedure Laterality Date  . Appendectomy    . Tonsillectomy    . Prostate surgery      for enlarged prostate and blockages - no cancer   History reviewed. No pertinent family history. History  Substance Use Topics  . Smoking status: Never Smoker   . Smokeless tobacco: Never Used  . Alcohol Use: Yes     Comment: glass wine approx. 6 days week    Review of Systems  Constitutional: Negative for fever, diaphoresis, activity change, appetite change and fatigue.  HENT: Negative for congestion, facial swelling, rhinorrhea, sore throat and trouble swallowing.   Eyes: Negative for photophobia and pain.  Respiratory: Positive for shortness of breath. Negative for cough, sputum production, chest tightness and wheezing.   Cardiovascular: Positive for chest pain. Negative for claudication, leg swelling and syncope.  Gastrointestinal: Positive for abdominal pain. Negative for nausea, vomiting, diarrhea and constipation.  Endocrine: Negative for polydipsia and polyuria.  Genitourinary: Negative for dysuria, urgency, decreased urine volume and difficulty urinating.  Musculoskeletal: Negative for back pain,  gait problem and neck pain.  Skin: Negative for color change, rash and wound.  Allergic/Immunologic: Negative for immunocompromised state.  Neurological: Negative for dizziness, facial asymmetry, speech difficulty, weakness, numbness and headaches.  Psychiatric/Behavioral: Negative  for confusion, decreased concentration and agitation.    Allergies  Review of patient's allergies indicates no known allergies.  Home Medications   No current outpatient prescriptions on file. BP 130/88  Pulse 72  Temp(Src) 98.3 F (36.8 C) (Oral)  Resp 17  Ht 6\' 1"  (1.854 m)  Wt 229 lb 11.5 oz (104.2 kg)  BMI 30.31 kg/m2  SpO2 95% Physical Exam  Constitutional: He is oriented to person, place, and time. He appears well-developed and well-nourished. No distress.  HENT:  Head: Normocephalic and atraumatic.  Mouth/Throat: No oropharyngeal exudate.  Eyes: Pupils are equal, round, and reactive to light.  Neck: Normal range of motion. Neck supple.  Cardiovascular: Normal rate, regular rhythm and normal heart sounds.  Exam reveals no gallop and no friction rub.   No murmur heard. Pulmonary/Chest: Effort normal and breath sounds normal. No respiratory distress. He has no wheezes. He has no rales.  Abdominal: Soft. Bowel sounds are normal. He exhibits no distension and no mass. There is no tenderness. There is no rebound and no guarding.  Musculoskeletal: Normal range of motion. He exhibits no edema and no tenderness.  Neurological: He is alert and oriented to person, place, and time.  Skin: Skin is warm and dry.     Psychiatric: He has a normal mood and affect.    ED Course  Procedures (including critical care time) Labs Review Labs Reviewed  BASIC METABOLIC PANEL - Abnormal; Notable for the following:    BUN 62 (*)    Creatinine, Ser 2.92 (*)    GFR calc non Af Amer 19 (*)    GFR calc Af Amer 22 (*)    All other components within normal limits  CBC WITH DIFFERENTIAL - Abnormal; Notable for the following:    RBC 3.88 (*)    HCT 38.2 (*)    Monocytes Relative 14 (*)    Monocytes Absolute 1.1 (*)    All other components within normal limits  PRO B NATRIURETIC PEPTIDE - Abnormal; Notable for the following:    Pro B Natriuretic peptide (BNP) 6146.0 (*)    All other  components within normal limits  CBC - Abnormal; Notable for the following:    RBC 3.73 (*)    Hemoglobin 12.6 (*)    HCT 36.6 (*)    All other components within normal limits  BASIC METABOLIC PANEL - Abnormal; Notable for the following:    BUN 60 (*)    Creatinine, Ser 2.93 (*)    GFR calc non Af Amer 19 (*)    GFR calc Af Amer 22 (*)    All other components within normal limits  CBC - Abnormal; Notable for the following:    RBC 3.86 (*)    HCT 37.8 (*)    All other components within normal limits  CULTURE, BLOOD (ROUTINE X 2)  CULTURE, BLOOD (ROUTINE X 2)  CULTURE, EXPECTORATED SPUTUM-ASSESSMENT  GRAM STAIN  TROPONIN I  HIV ANTIBODY (ROUTINE TESTING)  TROPONIN I  TROPONIN I  TROPONIN I  PROTIME-INR  PROTIME-INR  HEPARIN LEVEL (UNFRACTIONATED)  LEGIONELLA ANTIGEN, URINE  STREP PNEUMONIAE URINARY ANTIGEN  HEPARIN LEVEL (UNFRACTIONATED)   Imaging Review Dg Chest 2 View  09/30/2013   CLINICAL DATA:  Shortness of breath, abdominal pain.  EXAM: CHEST  2 VIEW  COMPARISON:  02/26/2009  FINDINGS: Patchy bilateral perihilar opacities, right greater than left. Increasing left lower lobe airspace opacities. Findings concerning for pneumonia. Small bilateral pleural effusions noted on the lateral view. Heart is upper limits normal in size. No acute bony abnormality.  IMPRESSION: Patchy bilateral perihilar and left lower lobe opacities or pneumonia. Small bilateral effusions.   Electronically Signed   By: Charlett Nose M.D.   On: 09/30/2013 17:58    EKG Interpretation     Ventricular Rate:  78 PR Interval:    QRS Duration: 85 QT Interval:  378 QTC Calculation: 430 R Axis:   83 Text Interpretation:  Atrial fibrillation Borderline right axis deviation.  Prior EKG w/ sinus rhythm.             MDM   1. New onset atrial fibrillation   2. CAP (community acquired pneumonia)   3. Chest pain   4. Chronic kidney disease, stage IV (severe)   5. Hypertension    Pt is a 79 y.o.  male with Pmhx as above who presents with EKG changes from PCP's office. Pt states he has occasional GERD "flares" with epigastric/chest burning, belching.  Current episode started about 1 week ago, symptoms intermittent, better with sitting upright.  2 days ago also began having SOB, globus sensation. SOB worse laying flat.  No change in chronic LE edema. No LE pain.  No fever, cough, n/v, diaphoresis, palpitations.  NO CP/AB pain currently.  EKG w/ new onset afib with rate in 70's.  Of note, pt being treated for RLE cellulitis on keflex.  CXR w/ patchy BL perihilar opacities & increasing LLL opacity concerning for pna.   BNP >6000 (though has hx of renal dz), trop negative.   PORT score of 100 given age, renal hx, and plerual effusion on XR.  I believe tp would benefit from inpt admission for pna as well as w/u for new onset a fib, ACS r/o.  Rocephin/azithro given.  [facility] will admit.       Shanna Cisco, MD 10/01/13 1350

## 2013-09-30 NOTE — ED Notes (Signed)
Patient with GERD like symptoms which started about a week ago.  Patient reports he has had this before, and goes to his PCP to get an EKG because he is afraid he is having a heart attack.  His symptoms increased and patient reports he feels like a tennis ball is in his throat and it is difficult to breathe.  Patient had an abnormal EKG at his doctors office today and was sent to ED.

## 2013-09-30 NOTE — H&P (Signed)
PATIENT DETAILS Name: Trevor Burch Age: 79 y.o. Sex: male Date of Birth: Apr 23, 1931 Admit Date: 09/30/2013 [id number], Clifton Custard, MD   CHIEF COMPLAINT:  Heartburn  HPI: Trevor Burch is a 79 y.o. male with a Past Medical History of GERD, hypertension, stage IV chronic kidney disease who presents today with the above noted complaint. Per patient, earlier today he had epigastric/chest pain which he describes as burning. There was some associated belching as well. Although the symptoms started approximately a few days ago, it was much worse today. He also describes having transient difficulty swallowing earlier today-this was to both liquids and solids. Because of these symptoms, he went to his primary care practitioner where a EKG was done which showed atrial fibrillation. Patient was then referred to the emergency room. In the ED, a chest x-ray was suggestive of pneumonia, he can also gives a history of having subjective fever with chills yesterday, but denies any cough. He is now being admitted for further evaluation and treatment.  ALLERGIES:  No Known Allergies  PAST MEDICAL HISTORY: Past Medical History  Diagnosis Date  . Kidney function abnormal     has approx. 20% function - stablized x several years  . GERD (gastroesophageal reflux disease)     PAST SURGICAL HISTORY: Past Surgical History  Procedure Laterality Date  . Appendectomy    . Tonsillectomy    . Prostate surgery      for enlarged prostate and blockages - no cancer    MEDICATIONS AT HOME: Prior to Admission medications   Medication Sig Start Date End Date Taking? Authorizing Provider  amLODipine (NORVASC) 5 MG tablet Take 5 mg by mouth 2 (two) times daily. 09/19/13  Yes Historical Provider, MD  atenolol (TENORMIN) 25 MG tablet Take 25 mg by mouth daily. 09/11/13  Yes Historical Provider, MD  cephALEXin (KEFLEX) 500 MG capsule Take 500 mg by mouth 3 (three) times daily. 09/06/13  Yes Historical Provider, MD   Coenzyme Q10 50 MG CAPS Take 1 capsule by mouth daily.   Yes Historical Provider, MD  fish oil-omega-3 fatty acids 1000 MG capsule Take 2 g by mouth daily.   Yes Historical Provider, MD  furosemide (LASIX) 80 MG tablet Take 80 mg by mouth 2 (two) times daily. 09/26/13  Yes Historical Provider, MD  Multiple Vitamin (MULTIVITAMIN WITH MINERALS) TABS tablet Take 1 tablet by mouth daily.   Yes Historical Provider, MD  omeprazole-sodium bicarbonate (ZEGERID) 40-1100 MG per capsule Take 1 capsule by mouth daily before breakfast.  07/24/13  Yes Historical Provider, MD  VOLTAREN 1 % GEL Apply 2 g topically 3 (three) times daily as needed.  07/15/13  Yes Historical Provider, MD    FAMILY HISTORY: History reviewed. No pertinent family history.  SOCIAL HISTORY:  reports that he has never smoked. He has never used smokeless tobacco. He reports that he drinks alcohol. He reports that he does not use illicit drugs.  REVIEW OF SYSTEMS:  Constitutional:   No  weight loss, night sweats,  Fevers, chills, fatigue.  HEENT:    No headaches, Difficulty swallowing,Tooth/dental problems,Sore throat,  No sneezing, itching, ear ache, nasal congestion, post nasal drip,   Cardio-vascular: No  Orthopnea, PND, swelling in lower extremities, anasarca, dizziness, palpitations  GI:  No abdominal pain, nausea, vomiting, diarrhea, change in  bowel habits, loss of appetite  Resp: No shortness of breath with exertion or at rest.  No excess mucus, no productive cough, No non-productive cough,  No coughing up of blood.No  change in color of mucus.No wheezing.No chest wall deformity  Skin:  no rash or lesions.  GU:  no dysuria, change in color of urine, no urgency or frequency.  No flank pain.  Musculoskeletal: No joint pain or swelling.  No decreased range of motion.  No back pain.  Psych: No change in mood or affect. No depression or anxiety.  No memory loss.   PHYSICAL EXAM: Blood pressure 159/98, pulse 106,  temperature 97.7 F (36.5 C), temperature source Oral, resp. rate 21, SpO2 95.00%.  General appearance :Awake, alert, not in any distress. Speech Clear. Not toxic Looking HEENT: Atraumatic and Normocephalic, pupils equally reactive to light and accomodation Neck: supple, no JVD. No cervical lymphadenopathy.  Chest:Good air entry bilaterally, no added sounds  CVS: S1 S2 irregular, no murmurs.  Abdomen: Bowel sounds present, Non tender and not distended with no gaurding, rigidity or rebound. Extremities: B/L Lower Ext shows 1+ edema, both legs are warm to touch Neurology: Awake alert, and oriented X 3, CN II-XII intact, Non focal Skin:No Rash Wounds:N/A  LABS ON ADMISSION:   Recent Labs  09/30/13 1700  NA 137  K 4.7  CL 105  CO2 22  GLUCOSE 90  BUN 62*  CREATININE 2.92*  CALCIUM 9.8   No results found for this basename: AST, ALT, ALKPHOS, BILITOT, PROT, ALBUMIN,  in the last 72 hours No results found for this basename: LIPASE, AMYLASE,  in the last 72 hours  Recent Labs  09/30/13 1700  WBC 8.2  NEUTROABS 5.8  HGB 13.0  HCT 38.2*  MCV 98.5  PLT 187    Recent Labs  09/30/13 1700  TROPONINI <0.30   No results found for this basename: DDIMER,  in the last 72 hours No components found with this basename: POCBNP,    RADIOLOGIC STUDIES ON ADMISSION: Dg Chest 2 View  09/30/2013   CLINICAL DATA:  Shortness of breath, abdominal pain.  EXAM: CHEST  2 VIEW  COMPARISON:  02/26/2009  FINDINGS: Patchy bilateral perihilar opacities, right greater than left. Increasing left lower lobe airspace opacities. Findings concerning for pneumonia. Small bilateral pleural effusions noted on the lateral view. Heart is upper limits normal in size. No acute bony abnormality.  IMPRESSION: Patchy bilateral perihilar and left lower lobe opacities or pneumonia. Small bilateral effusions.   Electronically Signed   By: Charlett Nose M.D.   On: 09/30/2013 17:58     EKG: Independently reviewed.   atrial fibrillation- rate in the 60s- 70s  ASSESSMENT AND PLAN: Present on Admission:  . Community acquired pneumonia - Apart from some subjective fever yesterday, no other symptoms, and empirically start Rocephin and Zithromax. Follow clinical course.   . Chest pain - The patient's symptoms are more suggestive for a GI etiology. However will admit to telemetry and cycle cardiac enzymes given new onset A. fib. We'll check an echocardiogram.   . New onset Atrial fibrillation - EKG confirms A. fib, rate controlled- patient already on atenolol as an outpatient. Which we will continue - Will go ahead and start heparin overlapping Coumadin-as Chadsvasc score of atleast 3 - Patient sees Dr. Garnette Scheuermann, we'll defer to the rounding physician in the a.m. to get a cardiology consultation.  . Gastroesophageal reflux disease - Continue PPI  - If symptoms of heartburn and difficulty swallowing persists, may need inpatient GI evaluation.   . Hypertension - Continue with preadmission medications   . Chronic kidney disease, stage IV (severe) - Creatinine very close to her usual baseline.  On chronic Lasix, does have some mild lower extremity edema.   Further plan will depend as patient's clinical course evolves and further radiologic and laboratory data become available. Patient will be monitored closely.  DVT Prophylaxis: On heparin infusion   Code Status: Full Code  Total time spent for admission equals 45 minutes.  [facility] Pager [phone]  If 7PM-7AM, please contact night-coverage [email or web address] Password [email or web address] 09/30/2013, 8:00 PM

## 2013-09-30 NOTE — Progress Notes (Signed)
ANTICOAGULATION CONSULT NOTE - Initial Consult  Pharmacy Consult for heparin + warfarin Indication: atrial fibrillation  No Known Allergies  Patient Measurements: Height: 6\' 1"  (185.4 cm) Weight: 229 lb 11.5 oz (104.2 kg) IBW/kg (Calculated) : 79.9 Heparin Dosing Weight: 101.2kg  Vital Signs: Temp: 97.9 F (36.6 C) (11/10 2137) Temp src: Oral (11/10 2137) BP: 149/66 mmHg (11/10 2137) Pulse Rate: 71 (11/10 2137)  Labs:  Recent Labs  09/30/13 1700  HGB 13.0  HCT 38.2*  PLT 187  CREATININE 2.92*  TROPONINI <0.30    Estimated Creatinine Clearance: 25.6 ml/min (by C-G formula based on Cr of 2.92).   Medical History: Past Medical History  Diagnosis Date  . Kidney function abnormal     has approx. 20% function - stablized x several years  . GERD (gastroesophageal reflux disease)     Medications:  Prescriptions prior to admission  Medication Sig Dispense Refill  . amLODipine (NORVASC) 5 MG tablet Take 5 mg by mouth 2 (two) times daily.      Marland Kitchen atenolol (TENORMIN) 25 MG tablet Take 25 mg by mouth daily.      . cephALEXin (KEFLEX) 500 MG capsule Take 500 mg by mouth 3 (three) times daily.      . Coenzyme Q10 50 MG CAPS Take 1 capsule by mouth daily.      . fish oil-omega-3 fatty acids 1000 MG capsule Take 2 g by mouth daily.      . furosemide (LASIX) 80 MG tablet Take 80 mg by mouth 2 (two) times daily.      . Multiple Vitamin (MULTIVITAMIN WITH MINERALS) TABS tablet Take 1 tablet by mouth daily.      Marland Kitchen omeprazole-sodium bicarbonate (ZEGERID) 40-1100 MG per capsule Take 1 capsule by mouth daily before breakfast.       . VOLTAREN 1 % GEL Apply 2 g topically 3 (three) times daily as needed.         Assessment: 82 yom presented with heartburn to primary care but was found to be in atrial fibrillation. To start IV heparin and coumadin for anticoagulation. Baseline CBC is WNL, no INR available. No other anticoagulants PTA. Of note, also started on azithromycin which may  increase the INR.  Goal of Therapy:  INR 2-3 Heparin level 0.3-0.7 units/ml Monitor platelets by anticoagulation protocol: Yes   Plan:  1. Heparin bolus 5000 units IV x 1 2. Heparin gtt 1400 units/hr 3. Check an 8 hour heparin level 4. Daily heparin level and CBC 5. INR now - If WNL, will give warfarin 5mg  PO x 1 tonight 6. Daily INR 7. Initiate coumadin education with coumadin book + video  Ayaana Biondo, Drake Leach 09/30/2013,10:01 PM

## 2013-09-30 NOTE — ED Notes (Signed)
[facility] was notified of pt's transfer to [facility]. 

## 2013-09-30 NOTE — Progress Notes (Signed)
PHARMACIST - PHYSICIAN ORDER COMMUNICATION  CONCERNING: P&T Medication Policy on Herbal Medications  DESCRIPTION:  This patient's order for:  Coenzyme Q-10  has been noted.  This product(s) is classified as an "herbal" or natural product. Due to a lack of definitive safety studies or FDA approval, nonstandard manufacturing practices, plus the potential risk of unknown drug-drug interactions while on inpatient medications, the Pharmacy and Therapeutics Committee does not permit the use of "herbal" or natural products of this type within [employer].   ACTION TAKEN: The pharmacy department is unable to verify this order at this time.  The medication has been discontinued while in the hospital. Please reevaluate patient's clinical condition at discharge and address if the herbal or natural product(s) should be resumed at that time.  Nicolette Bang, RPh 09/30/2013 9:59 PM

## 2013-10-01 DIAGNOSIS — I359 Nonrheumatic aortic valve disorder, unspecified: Secondary | ICD-10-CM

## 2013-10-01 DIAGNOSIS — I10 Essential (primary) hypertension: Secondary | ICD-10-CM

## 2013-10-01 LAB — HEPARIN LEVEL (UNFRACTIONATED)
Heparin Unfractionated: 0.38 IU/mL (ref 0.30–0.70)
Heparin Unfractionated: 0.36 IU/mL (ref 0.30–0.70)

## 2013-10-01 LAB — CBC
Hemoglobin: 12.6 g/dL — ABNORMAL LOW (ref 13.0–17.0)
MCH: 33.8 pg (ref 26.0–34.0)
MCH: 33.9 pg (ref 26.0–34.0)
MCHC: 34.7 g/dL (ref 30.0–36.0)
MCV: 97.9 fL (ref 78.0–100.0)
Platelets: 184 10*3/uL (ref 150–400)
RBC: 3.73 MIL/uL — ABNORMAL LOW (ref 4.22–5.81)
RBC: 3.86 MIL/uL — ABNORMAL LOW (ref 4.22–5.81)

## 2013-10-01 LAB — PROTIME-INR
INR: 0.98 (ref 0.00–1.49)
INR: 1 (ref 0.00–1.49)
Prothrombin Time: 13 seconds (ref 11.6–15.2)

## 2013-10-01 LAB — STREP PNEUMONIAE URINARY ANTIGEN: Strep Pneumo Urinary Antigen: NEGATIVE

## 2013-10-01 LAB — TROPONIN I
Troponin I: <0.3 ng/mL (ref <0.30)
Troponin I: <0.3 ng/mL (ref <0.30)

## 2013-10-01 LAB — BASIC METABOLIC PANEL
CO2: 24 mEq/L (ref 19–32)
Calcium: 9.8 mg/dL (ref 8.4–10.5)
Glucose, Bld: 83 mg/dL (ref 70–99)
Potassium: 4.6 mEq/L (ref 3.5–5.1)
Sodium: 141 mEq/L (ref 135–145)

## 2013-10-01 MED ORDER — WARFARIN SODIUM 7.5 MG PO TABS
7.5000 mg | ORAL_TABLET | Freq: Once | ORAL | Status: AC
  Administered 2013-10-01: 7.5 mg via ORAL
  Filled 2013-10-01: qty 1

## 2013-10-01 NOTE — Progress Notes (Signed)
*  PRELIMINARY RESULTS* Echocardiogram 2D Echocardiogram has been performed.  Trevor Burch 10/01/2013, 11:05 AM

## 2013-10-01 NOTE — Progress Notes (Signed)
ANTICOAGULATION CONSULT NOTE - Initial Consult  Pharmacy Consult for heparin + warfarin Indication: atrial fibrillation  No Known Allergies  Patient Measurements: Height: 6\' 1"  (185.4 cm) Weight: 229 lb 11.5 oz (104.2 kg) IBW/kg (Calculated) : 79.9 Heparin Dosing Weight: 101.2kg  Vital Signs: Temp: 98.3 F (36.8 C) (11/11 0547) Temp src: Oral (11/11 0547) BP: 142/88 mmHg (11/11 0547) Pulse Rate: 73 (11/11 0700)  Labs:  Recent Labs  09/30/13 1700 09/30/13 2140 09/30/13 2350 10/01/13 0425 10/01/13 0725  HGB 13.0  --   --  12.6* 13.1  HCT 38.2*  --   --  36.6* 37.8*  PLT 187  --   --  180 184  LABPROT  --   --  12.8  --  13.0  INR  --   --  0.98  --  1.00  HEPARINUNFRC  --   --   --   --  0.38  CREATININE 2.92*  --   --  2.93*  --   TROPONINI <0.30 <0.30  --  <0.30  --     Estimated Creatinine Clearance: 25.5 ml/min (by C-G formula based on Cr of 2.93).   Medical History: Past Medical History  Diagnosis Date  . Kidney function abnormal     has approx. 20% function - stablized x several years  . GERD (gastroesophageal reflux disease)     Medications:  Prescriptions prior to admission  Medication Sig Dispense Refill  . amLODipine (NORVASC) 5 MG tablet Take 5 mg by mouth 2 (two) times daily.      Marland Kitchen atenolol (TENORMIN) 25 MG tablet Take 25 mg by mouth daily.      . cephALEXin (KEFLEX) 500 MG capsule Take 500 mg by mouth 3 (three) times daily.      . Coenzyme Q10 50 MG CAPS Take 1 capsule by mouth daily.      . fish oil-omega-3 fatty acids 1000 MG capsule Take 2 g by mouth daily.      . furosemide (LASIX) 80 MG tablet Take 80 mg by mouth 2 (two) times daily.      . Multiple Vitamin (MULTIVITAMIN WITH MINERALS) TABS tablet Take 1 tablet by mouth daily.      Marland Kitchen omeprazole-sodium bicarbonate (ZEGERID) 40-1100 MG per capsule Take 1 capsule by mouth daily before breakfast.       . VOLTAREN 1 % GEL Apply 2 g topically 3 (three) times daily as needed.          Assessment: 82 yom presented with heartburn to primary care but was found to be in atrial fibrillation. Started IV heparin and coumadin for anticoagulation last night. Heparin level (0.38) is within therapeutic range, INR = 1.0, low as expected after one dose of coumadin 5mg  last night. He is also on D#2 azithromycin which may increase the INR.  Goal of Therapy:  INR 2-3 Heparin level 0.3-0.7 units/ml Monitor platelets by anticoagulation protocol: Yes   Plan:  - Continue heparin infusion 1400 units/hr - Confirmatory heparin level at 1600 - Daily heparin level and CBC - Coumadin 7.5mg  po x 1  - Daily INR  Bayard Hugger, PharmD, BCPS  Clinical Pharmacist  Pager: [phone]   10/01/2013,8:41 AM

## 2013-10-01 NOTE — Progress Notes (Signed)
Pt given information regarding Coumadin; pt also watching Coumadin video at this time; will give Coumadin booklet once available.

## 2013-10-01 NOTE — Care Management Note (Signed)
    Page 1 of 1   10/02/2013     4:38:14 PM   CARE MANAGEMENT NOTE 10/02/2013  Patient:  Trevor Burch, Trevor Burch   Account Number:  [id number]  Date Initiated:  10/01/2013  Documentation initiated by:  Nixie Laube  Subjective/Objective Assessment:   PT ADM ON 10/01/13 WITH AFIB.  PTA, PT INDEPENDENT, LIVES WITH SPOUSE.     Action/Plan:   WILL FOLLOW FOR DISCHARGE NEEDS AS PT PROGRESSES.   Anticipated DC Date:  10/03/2013   Anticipated DC Plan:  HOME/SELF CARE      DC Planning Services  CM consult  Medication Assistance      Choice offered to / List presented to:             Status of service:  Completed, signed off Medicare Important Message given?   (If response is "NO", the following Medicare IM given date fields will be blank) Date Medicare IM given:   Date Additional Medicare IM given:    Discharge Disposition:  HOME/SELF CARE  Per UR Regulation:  Reviewed for med. necessity/level of care/duration of stay  If discussed at Long Length of Stay Meetings, dates discussed:    Comments:  10/02/13 Denita Lun,RN,BSN [phone] PT FOR DC HOME TODAY ON [facility]QU[facility].  30 DAY FREE TRIAL CARD AND COPAY CARD GIVEN TO PT, AS HE HAS BCBS PRIMARY.  PT STATES HE USES MAIL ORDER FOR RX MEDS, AND WILL TRY TO USE COPAY CARD. COPAY FOR 3O DAYS OF ELIQU[facility] RETAIL IS $17, AND $13 FOR 90 DAYS BY MAIL ORDER.

## 2013-10-01 NOTE — Progress Notes (Signed)
ANTICOAGULATION CONSULT NOTE - Follow Up Consult  Pharmacy Consult for Heparin Indication: atrial fibrillation  No Known Allergies  Patient Measurements: Height: 6\' 1"  (185.4 cm) Weight: 229 lb 11.5 oz (104.2 kg) IBW/kg (Calculated) : 79.9 Heparin Dosing Weight: 101kg  Vital Signs: Temp: 98.4 F (36.9 C) (11/11 1345) Temp src: Oral (11/11 1345) BP: 123/74 mmHg (11/11 1345) Pulse Rate: 58 (11/11 1345)  Labs:  Recent Labs  09/30/13 1700 09/30/13 2140 09/30/13 2350 10/01/13 0425 10/01/13 0725 10/01/13 1129 10/01/13 1549  HGB 13.0  --   --  12.6* 13.1  --   --   HCT 38.2*  --   --  36.6* 37.8*  --   --   PLT 187  --   --  180 184  --   --   LABPROT  --   --  12.8  --  13.0  --   --   INR  --   --  0.98  --  1.00  --   --   HEPARINUNFRC  --   --   --   --  0.38  --  0.36  CREATININE 2.92*  --   --  2.93*  --   --   --   TROPONINI <0.30 <0.30  --  <0.30  --  <0.30  --     Estimated Creatinine Clearance: 25.5 ml/min (by C-G formula based on Cr of 2.93).   Medications:  Heparin 1400 units/hr   Assessment: 82yom on heparin bridging to Coumadin for new onset afib. Heparin level (0.36) remains therapeutic - will continue current rate and follow-up AM heparin level. - H/H and Plts stable - No significant bleeding reported  Goal of Therapy:  Heparin level 0.3-0.7 units/ml Monitor platelets by anticoagulation protocol: Yes   Plan:  1. Continue heparin 1400 units/hr (14 ml/hr) 2. Follow-up AM heparin level   Cleon Dew [phone] 10/01/2013,6:37 PM

## 2013-10-01 NOTE — Progress Notes (Signed)
Follow-up:  I was asked to see pt s/p transfer from [facility] to [facility] room 2W-08. Trevor Burch is an 79 y/o gentleman admitted today for CAP, new onset A-Fib, GERD and HTN. At bedside pt noted ambulatory in room washing up at sink w/ assistance. RN reports he has no c/o's since arrival to the floor. VSS and pt is afebrile. He remains in A-Fib w/ a controlled rate in the 70's. Pt transferred to [facility] for cardiology consult. He is a pt of Dr Verdis Prime. Will continue to monitor closely on telemetry.   Leanne Chang, NP-C [facility] Pager [phone]

## 2013-10-01 NOTE — Consult Note (Signed)
Name: Trevor Burch is a 79 y.o. male Admit date: 09/30/2013 Referring Physician:   Primary Physician:  Farris Has Primary Cardiologist:  Gwynneth Albright, M.D.  Reason for Consultation:  Atrial fibrillation with RVR  ASSESSMENT: 1. Atrial fibrillation with rapid ventricular response, symptomatic, and new onset 2. History of non-obstructive coronary artery disease by catheterization 2007 3. Hypertension 4. Chest discomfort likely cardiac in origin related to poor rate control and potential progression of nonobstructive disease identified in 2007.   PLAN: 1. Chronic long-term anticoagulation therapy, given CHADS score greater than 1 2. Rate control and if still symptomatic, we may need to convert to strategy to rhythm control. CI he does once rate is controlled. 3. Agree with IV heparin and rate slowing agents for the time being. 4. I do not believe that a repeat ischemic evaluation is necessary   HPI: On Saturday the patient noted that he was weak and had no energy. He was breathless and had a poorly characterized chest discomfort. He was eventually found to be in atrial fibrillation and referred to the emergency room after being seen in urgent care. He was admitted to the hospital for workup and initiation of therapy to  PMH:   Past Medical History  Diagnosis Date  . Kidney function abnormal     has approx. 20% function - stablized x several years  . GERD (gastroesophageal reflux disease)     PSH:   Past Surgical History  Procedure Laterality Date  . Appendectomy    . Tonsillectomy    . Prostate surgery      for enlarged prostate and blockages - no cancer   Allergies:  Review of patient's allergies indicates no known allergies. Prior to Admit Meds:   Prescriptions prior to admission  Medication Sig Dispense Refill  . amLODipine (NORVASC) 5 MG tablet Take 5 mg by mouth 2 (two) times daily.      Marland Kitchen atenolol (TENORMIN) 25 MG tablet Take 25 mg by mouth daily.        . cephALEXin (KEFLEX) 500 MG capsule Take 500 mg by mouth 3 (three) times daily.      . Coenzyme Q10 50 MG CAPS Take 1 capsule by mouth daily.      . fish oil-omega-3 fatty acids 1000 MG capsule Take 2 g by mouth daily.      . furosemide (LASIX) 80 MG tablet Take 80 mg by mouth 2 (two) times daily.      . Multiple Vitamin (MULTIVITAMIN WITH MINERALS) TABS tablet Take 1 tablet by mouth daily.      Marland Kitchen omeprazole-sodium bicarbonate (ZEGERID) 40-1100 MG per capsule Take 1 capsule by mouth daily before breakfast.       . VOLTAREN 1 % GEL Apply 2 g topically 3 (three) times daily as needed.        Fam HX:   History reviewed. No pertinent family history. Social HX:    History   Social History  . Marital Status: Married    Spouse Name: N/A    Number of Children: N/A  . Years of Education: N/A   Occupational History  . Not on file.   Social History Main Topics  . Smoking status: Never Smoker   . Smokeless tobacco: Never Used  . Alcohol Use: Yes     Comment: glass wine approx. 6 days week  . Drug Use: No  . Sexual Activity: Not on file   Other Topics Concern  . Not on file  Social History Narrative  . No narrative on file     Review of Systems: Acute onset of fatigue as noted above  Physical Exam: Blood pressure 123/74, pulse 58, temperature 98.4 F (36.9 C), temperature source Oral, resp. rate 20, height 6\' 1"  (1.854 m), weight 229 lb 11.5 oz (104.2 kg), SpO2 95.00%. Weight change:   Patient is sitting reading the newspaper  Chest is clear  Neck exam reveals no JVD no carotid bruits  Cardiac exam reveals an irregularly irregular rhythm with a 2 of 6 systolic murmur left midsternal border  Abdomen soft without tenderness. Bowel sounds are normal.  Neurological exam is unremarkable. Extremities reveal no edema. Pulses are intact  Labs: Lab Results  Component Value Date   WBC 7.7 10/01/2013   HGB 13.1 10/01/2013   HCT 37.8* 10/01/2013   MCV 97.9 10/01/2013   PLT 184  10/01/2013    Recent Labs Lab 10/01/13 0425  NA 141  K 4.6  CL 107  CO2 24  BUN 60*  CREATININE 2.93*  CALCIUM 9.8  GLUCOSE 83   No results found for this basename: PTT   Lab Results  Component Value Date   INR 1.00 10/01/2013   INR 0.98 09/30/2013   Lab Results  Component Value Date   CKTOTAL 141 09/03/2007   CKMB 9.6* 09/03/2007   TROPONINI <0.30 10/01/2013     Lab Results  Component Value Date   CHOL  Value: 183        ATP III CLASSIFICATION:  <200     mg/dL   Desirable  657-846  mg/dL   Borderline High  >=962    mg/dL   High 95/28/4132   Lab Results  Component Value Date   HDL 34* 09/04/2007   Lab Results  Component Value Date   LDLCALC  Value: 130        Total Cholesterol/HDL:CHD Risk Coronary Heart Disease Risk Table                     Men   Women  1/2 Average Risk   3.4   3.3* 09/04/2007   Lab Results  Component Value Date   TRIG 95 09/04/2007   Lab Results  Component Value Date   CHOLHDL 5.4 09/04/2007   No results found for this basename: LDLDIRECT      Radiology:  Dg Chest 2 View  09/30/2013   CLINICAL DATA:  Shortness of breath, abdominal pain.  EXAM: CHEST  2 VIEW  COMPARISON:  02/26/2009  FINDINGS: Patchy bilateral perihilar opacities, right greater than left. Increasing left lower lobe airspace opacities. Findings concerning for pneumonia. Small bilateral pleural effusions noted on the lateral view. Heart is upper limits normal in size. No acute bony abnormality.  IMPRESSION: Patchy bilateral perihilar and left lower lobe opacities or pneumonia. Small bilateral effusions.   Electronically Signed   By: Charlett Nose M.D.   On: 09/30/2013 17:58   EKG:  Atrial fibrillation with controlled ventricular response  ECHO: Study Conclusions  - Left ventricle: The cavity size was normal. There was moderate concentric hypertrophy. Systolic function was normal. Wall motion was normal; there were no regional wall motion abnormalities. - Aortic valve:  Moderate thickening and calcification. There was very mild stenosis. Mild regurgitation. Valve area: 1.88cm^2(VTI). Valve area: 1.98cm^2 (Vmax). - Mitral valve: Mild regurgitation. - Left atrium: The atrium was mildly dilated.   Lesleigh Noe 10/01/2013 5:56 PM

## 2013-10-01 NOTE — Progress Notes (Signed)
[facility] PROGRESS NOTE  Trevor Burch WUJ:[id number] DOB: 04-13-1931 DOA: 09/30/2013 PCP: Farris Has, MD  Assessment/Plan: 79 y.o. male with a Past Medical History of GERD, hypertension, stage IV chronic kidney disease who presented with chest , epigastric pain and found to have new a fib in the setting of PNA   1. A fib new, started on IV heparin/coumadin;  -cont BB, cont anticoagulation, d/w cardiology; pend echo   2. Pneumonia; afebrile; no leukocytosis; CXR: Patchy bilateral perihilar and left lower lobe opacities or pneumonia -cont IV atx, oxygen, bronchodilators; pend blood c/s;   3. Chest pain likely GERD; trop neg; cont PPI; pend echo   4. HTN cont home regimen/titrate   5. CKD IV; cont diuresis, stable; monitor     Code Status: full Family Communication: none at the bedside (indicate person spoken with, relationship, and if by phone, the number) Disposition Plan: home when ready    Consultants:  Cardiology   Procedures:  CXR  Antibiotics:  Azithromycin 10/10<<<<<  Ceftriaxone 10/10<<<<<<  (indicate start date, and stop date if known)  HPI/Subjective: Alert  Objective: Filed Vitals:   10/01/13 0700  BP:   Pulse: 73  Temp:   Resp:     Intake/Output Summary (Last 24 hours) at 10/01/13 0859 Last data filed at 10/01/13 0809  Gross per 24 hour  Intake    274 ml  Output      0 ml  Net    274 ml   Filed Weights   09/30/13 2137  Weight: 104.2 kg (229 lb 11.5 oz)    Exam:   General:  alert  Cardiovascular: s1,s2 irregular   Respiratory: few LL crackles   Abdomen: soft, nt, nd   Musculoskeletal: no LE edema   Data Reviewed: Basic Metabolic Panel:  Recent Labs Lab 09/30/13 1700 10/01/13 0425  NA 137 141  K 4.7 4.6  CL 105 107  CO2 22 24  GLUCOSE 90 83  BUN 62* 60*  CREATININE 2.92* 2.93*  CALCIUM 9.8 9.8   Liver Function Tests: No results found for this basename: AST, ALT, ALKPHOS, BILITOT, PROT, ALBUMIN,  in the  last 168 hours No results found for this basename: LIPASE, AMYLASE,  in the last 168 hours No results found for this basename: AMMONIA,  in the last 168 hours CBC:  Recent Labs Lab 09/30/13 1700 10/01/13 0425 10/01/13 0725  WBC 8.2 8.0 7.7  NEUTROABS 5.8  --   --   HGB 13.0 12.6* 13.1  HCT 38.2* 36.6* 37.8*  MCV 98.5 98.1 97.9  PLT 187 180 184   Cardiac Enzymes:  Recent Labs Lab 09/30/13 1700 09/30/13 2140 10/01/13 0425  TROPONINI <0.30 <0.30 <0.30   BNP (last 3 results)  Recent Labs  09/30/13 1700  PROBNP 6146.0*   CBG: No results found for this basename: GLUCAP,  in the last 168 hours  No results found for this or any previous visit (from the past 240 hour(s)).   Studies: Dg Chest 2 View  09/30/2013   CLINICAL DATA:  Shortness of breath, abdominal pain.  EXAM: CHEST  2 VIEW  COMPARISON:  02/26/2009  FINDINGS: Patchy bilateral perihilar opacities, right greater than left. Increasing left lower lobe airspace opacities. Findings concerning for pneumonia. Small bilateral pleural effusions noted on the lateral view. Heart is upper limits normal in size. No acute bony abnormality.  IMPRESSION: Patchy bilateral perihilar and left lower lobe opacities or pneumonia. Small bilateral effusions.   Electronically Signed   By: Caryn Bee  Dover M.D.   On: 09/30/2013 17:58    Scheduled Meds: . amLODipine  5 mg Oral BID  . atenolol  25 mg Oral Daily  . azithromycin  500 mg Intravenous Q24H  . cefTRIAXone (ROCEPHIN)  IV  1 g Intravenous Q24H  . coumadin book   Does not apply Once  . furosemide  80 mg Oral BID  . multivitamin with minerals  1 tablet Oral Daily  . omega-3 acid ethyl esters  2 g Oral Daily  . pantoprazole  40 mg Oral Daily  . patient's guide to using coumadin book   Does not apply Once  . sodium chloride  3 mL Intravenous Q12H  . warfarin  7.5 mg Oral ONCE-1800  . [COMPLETED] warfarin   Does not apply Once  . Warfarin - Pharmacist Dosing Inpatient   Does not apply  q1800   Continuous Infusions: . heparin 1,400 Units/hr (10/01/13 0000)    Principal Problem:   Community acquired pneumonia Active Problems:   Chest pain   Gastroesophageal reflux disease   Hypertension   Chronic kidney disease, stage IV (severe)   Atrial fibrillation    Time spent: > 35 minutes     Esperanza Sheets  [facility] Pager [phone]. If 7PM-7AM, please contact night-coverage at [email or web address], password [employer] 10/01/2013, 8:59 AM  LOS: 1 day

## 2013-10-02 ENCOUNTER — Telehealth: Payer: Self-pay

## 2013-10-02 LAB — CBC
MCHC: 34.1 g/dL (ref 30.0–36.0)
MCV: 99.5 fL (ref 78.0–100.0)
Platelets: 177 10*3/uL (ref 150–400)
RDW: 13.3 % (ref 11.5–15.5)
WBC: 6.9 10*3/uL (ref 4.0–10.5)

## 2013-10-02 LAB — PROTIME-INR: INR: 1.04 (ref 0.00–1.49)

## 2013-10-02 LAB — LEGIONELLA ANTIGEN, URINE

## 2013-10-02 MED ORDER — APIXABAN 2.5 MG PO TABS
2.5000 mg | ORAL_TABLET | Freq: Once | ORAL | Status: AC
  Administered 2013-10-02: 2.5 mg via ORAL
  Filled 2013-10-02: qty 1

## 2013-10-02 MED ORDER — AMIODARONE HCL 200 MG PO TABS
200.0000 mg | ORAL_TABLET | Freq: Once | ORAL | Status: AC
  Administered 2013-10-02: 200 mg via ORAL
  Filled 2013-10-02: qty 1

## 2013-10-02 MED ORDER — WARFARIN SODIUM 2.5 MG PO TABS
12.5000 mg | ORAL_TABLET | Freq: Once | ORAL | Status: DC
  Filled 2013-10-02: qty 1

## 2013-10-02 MED ORDER — APIXABAN 2.5 MG PO TABS: 2.5000 mg | ORAL_TABLET | Freq: Two times a day (BID) | ORAL | Status: DC

## 2013-10-02 MED ORDER — AMIODARONE HCL 200 MG PO TABS: 200.0000 mg | ORAL_TABLET | Freq: Two times a day (BID) | ORAL | Status: DC

## 2013-10-02 MED ORDER — LEVOFLOXACIN 750 MG PO TABS: 750.0000 mg | ORAL_TABLET | Freq: Every day | ORAL | Status: DC

## 2013-10-02 NOTE — Progress Notes (Signed)
10/02/2013 1:06 PM Nursing note Discharge avs form, medications already taken today and those due this evening given and explained to patient and wife. Follow up appointment and when to call MD reviewed. D/c iv line. D/c tele. D/c home with wife per orders.  Uldine Fuster, Blanchard Kelch

## 2013-10-02 NOTE — Telephone Encounter (Signed)
called pt. pt wife aware of appt with Dr.Smith 10/09/13 @4pm . pt wife verbalized understanding

## 2013-10-02 NOTE — Progress Notes (Signed)
ANTICOAGULATION CONSULT NOTE - Initial Consult  Pharmacy Consult for heparin + warfarin Indication: atrial fibrillation  No Known Allergies  Patient Measurements: Height: 6\' 1"  (185.4 cm) Weight: 220 lb 4.8 oz (99.927 kg) IBW/kg (Calculated) : 79.9 Heparin Dosing Weight: 101.2kg  Vital Signs: Temp: 98.2 F (36.8 C) (11/12 0406) Temp src: Oral (11/12 0406) BP: 149/83 mmHg (11/12 0406) Pulse Rate: 73 (11/12 0406)  Labs:  Recent Labs  09/30/13 1700 09/30/13 2140 09/30/13 2350 10/01/13 0425 10/01/13 0725 10/01/13 1129 10/01/13 1549 10/02/13 0450  HGB 13.0  --   --  12.6* 13.1  --   --  12.8*  HCT 38.2*  --   --  36.6* 37.8*  --   --  37.5*  PLT 187  --   --  180 184  --   --  177  LABPROT  --   --  12.8  --  13.0  --   --  13.4  INR  --   --  0.98  --  1.00  --   --  1.04  HEPARINUNFRC  --   --   --   --  0.38  --  0.36 0.38  CREATININE 2.92*  --   --  2.93*  --   --   --   --   TROPONINI <0.30 <0.30  --  <0.30  --  <0.30  --   --     Estimated Creatinine Clearance: 25 ml/min (by C-G formula based on Cr of 2.93).   Medical History: Past Medical History  Diagnosis Date  . Kidney function abnormal     has approx. 20% function - stablized x several years  . GERD (gastroesophageal reflux disease)     Medications:  Prescriptions prior to admission  Medication Sig Dispense Refill  . amLODipine (NORVASC) 5 MG tablet Take 5 mg by mouth 2 (two) times daily.      Marland Kitchen atenolol (TENORMIN) 25 MG tablet Take 25 mg by mouth daily.      . cephALEXin (KEFLEX) 500 MG capsule Take 500 mg by mouth 3 (three) times daily.      . Coenzyme Q10 50 MG CAPS Take 1 capsule by mouth daily.      . fish oil-omega-3 fatty acids 1000 MG capsule Take 2 g by mouth daily.      . furosemide (LASIX) 80 MG tablet Take 80 mg by mouth 2 (two) times daily.      . Multiple Vitamin (MULTIVITAMIN WITH MINERALS) TABS tablet Take 1 tablet by mouth daily.      Marland Kitchen omeprazole-sodium bicarbonate (ZEGERID)  40-1100 MG per capsule Take 1 capsule by mouth daily before breakfast.       . VOLTAREN 1 % GEL Apply 2 g topically 3 (three) times daily as needed.         Assessment: 82 yom with new atrial fibrillation, on IV heparin and coumadin for anticoagulation. Heparin level (0.38) is stable and within therapeutic range, INR = 1.04 has not improved much, He is still on azithromycin. Hgb and Plts are stable.  Goal of Therapy:  INR 2-3 Heparin level 0.3-0.7 units/ml Monitor platelets by anticoagulation protocol: Yes   Plan:  - Continue heparin infusion 1400 units/hr - Coumadin 12.5mg  po x 1  - Daily INR, heparin level and CBC - If goes home, recommend lovenox 100mg  sq Q 24 hrs and INR check Friday  Bayard Hugger, PharmD, BCPS  Clinical Pharmacist  Pager: [phone]   10/02/2013,8:38 AM

## 2013-10-02 NOTE — Progress Notes (Signed)
       Patient Name: Trevor Burch Date of Encounter: 10/02/2013  This is a late entry.   SUBJECTIVE: Patient feels well this morning. His wife is presently a long discussion concerning the management of atrial fibrillation including indication for anticoagulation and the strategy behind rhythm versus rate control. Patient's atrial fibrillation was symptomatic.  TELEMETRY:  Atrial fib control rate.: Filed Vitals:   10/01/13 1345 10/01/13 2045 10/02/13 0406 10/02/13 1015  BP: 123/74 134/68 149/83 142/93  Pulse: 58 64 73 61  Temp: 98.4 F (36.9 C) 98.7 F (37.1 C) 98.2 F (36.8 C)   TempSrc: Oral Oral Oral   Resp: 20 20 20    Height:      Weight:   220 lb 4.8 oz (99.927 kg)   SpO2: 95% 97% 98%     Intake/Output Summary (Last 24 hours) at 10/02/13 1430 Last data filed at 10/02/13 1244  Gross per 24 hour  Intake 1122.66 ml  Output   1800 ml  Net -677.34 ml    LABS: Basic Metabolic Panel:  Recent Labs  47/82/95 1700 10/01/13 0425  NA 137 141  K 4.7 4.6  CL 105 107  CO2 22 24  GLUCOSE 90 83  BUN 62* 60*  CREATININE 2.92* 2.93*  CALCIUM 9.8 9.8   CBC:  Recent Labs  09/30/13 1700  10/01/13 0725 10/02/13 0450  WBC 8.2  < > 7.7 6.9  NEUTROABS 5.8  --   --   --   HGB 13.0  < > 13.1 12.8*  HCT 38.2*  < > 37.8* 37.5*  MCV 98.5  < > 97.9 99.5  PLT 187  < > 184 177  < > = values in this interval not displayed. Cardiac Enzymes:  Recent Labs  09/30/13 2140 10/01/13 0425 10/01/13 1129  TROPONINI <0.30 <0.30 <0.30     Radiology/Studies:  No new data  Physical Exam: Blood pressure 142/93, pulse 61, temperature 98.2 F (36.8 C), temperature source Oral, resp. rate 20, height 6\' 1"  (1.854 m), weight 220 lb 4.8 oz (99.927 kg), SpO2 98.00%. Weight change: -9 lb 6.7 oz (-4.273 kg)   Irregularly irregular rhythm  ASSESSMENT:  1. Atrial fibrillation with controlled rate. Because of symptoms that developed with the onset of atrial fib, we will try to  achieve rhythm control. 2. Chronic anticoagulation with Eliquis 2.5 mg twice a day  Plan:  1. Okay to discontinue IV heparin 2. Amiodarone 200 mg twice a day 3. Anticoagulation as listed above 4. Office visit in 5 days  Selinda Eon 10/02/2013, 2:30 PM

## 2013-10-02 NOTE — Progress Notes (Signed)
10/02/2013 9:27 AM Nursing note RN noted pt. HR ranging 56-65 Afib. MD on floor and made aware. Verbal orders received from Dr. York Spaniel ok to administer Atenolol 25 mg PO as scheduled. Will continue to closely monitor patient.

## 2013-10-02 NOTE — Discharge Summary (Signed)
Physician Discharge Summary  JESSUP OGAS UJW:[id number] DOB: 02/18/1931 DOA: 09/30/2013  PCP: Farris Has, MD  Admit date: 09/30/2013 Discharge date: 10/02/2013  Time spent: >35 minutes  Recommendations for Outpatient Follow-up:  F/u with Dr. Katrinka Blazing in 1 week Discharge Diagnoses:  Principal Problem:   Community acquired pneumonia Active Problems:   Chest pain   Gastroesophageal reflux disease   Hypertension   Chronic kidney disease, stage IV (severe)   Atrial fibrillation   Discharge Condition: stable   Diet recommendation: heart healthy   Filed Weights   09/30/13 2137 10/02/13 0406  Weight: 104.2 kg (229 lb 11.5 oz) 99.927 kg (220 lb 4.8 oz)    History of present illness:  79 y.o. male with a Past Medical History of GERD, hypertension, stage IV chronic kidney disease who presented with chest , epigastric pain and found to have new a fib in the setting of PNA   Hospital Course:  1. A fib new, HR controlled on BB, added amiodarone per cardiology; echo: normal LV function;  -patient was started on IV heparin/coumadin; d/w cardiology who recommended Eliques, d/w with pahrmacy to agree on dose based on CKD;  -f/u with cardiology outpatient  2. Pneumonia; afebrile; no leukocytosis; CXR: Patchy bilateral perihilar and left lower lobe opacities or pneumonia  -improved on IV atx,  blood c/s: NTD; changed to PO levofloxacin  3. Chest pain likely GERD; trop neg; cont PPI; no further test per cardiology att this time   4. HTN cont home regimen/titrate  5. CKD IV; cont diuresis, stable;   Procedures: Echo: Left ventricle: The cavity size was normal. There was moderate concentric hypertrophy. Systolic function was normal. Wall motion was normal; there were no regional wall motion abnormalities. - Aortic valve: Moderate thickening and calcification. There was very mild stenosis. Mild regurgitation. Valve area: 1.88cm^2(VTI). Valve area: 1.98cm^2 (Vmax). - Mitral valve: Mild  regurgitation. - Left atrium: The atrium was mildly dilated.     Consultations:   Discharge Exam: Filed Vitals:   10/02/13 1015  BP: 142/93  Pulse: 61  Temp:   Resp:     General: alert Cardiovascular: s1,s2 rrr Respiratory: CTA BL   Discharge Instructions  Discharge Orders   Future Appointments Provider Department Dept Phone   03/03/2014 3:00 PM Lesleigh Noe, MD [facility] [phone]   Future Orders Complete By Expires   Diet - low sodium heart healthy  As directed    Discharge instructions  As directed    Comments:     Please follow up with Dr. Katrinka Blazing cardiology in 1 week   Increase activity slowly  As directed        Medication List    STOP taking these medications       cephALEXin 500 MG capsule  Commonly known as:  KEFLEX      TAKE these medications       amiodarone 200 MG tablet  Commonly known as:  PACERONE  Take 1 tablet (200 mg total) by mouth 2 (two) times daily.     amLODipine 5 MG tablet  Commonly known as:  NORVASC  Take 5 mg by mouth 2 (two) times daily.     apixaban 2.5 MG Tabs tablet  Commonly known as:  ELIQUIS  Take 1 tablet (2.5 mg total) by mouth 2 (two) times daily.     atenolol 25 MG tablet  Commonly known as:  TENORMIN  Take 25 mg by mouth daily.     Coenzyme Q10  50 MG Caps  Take 1 capsule by mouth daily.     fish oil-omega-3 fatty acids 1000 MG capsule  Take 2 g by mouth daily.     furosemide 80 MG tablet  Commonly known as:  LASIX  Take 80 mg by mouth 2 (two) times daily.     levofloxacin 750 MG tablet  Commonly known as:  LEVAQUIN  Take 1 tablet (750 mg total) by mouth daily.     multivitamin with minerals Tabs tablet  Take 1 tablet by mouth daily.     omeprazole-sodium bicarbonate 40-1100 MG per capsule  Commonly known as:  ZEGERID  Take 1 capsule by mouth daily before breakfast.     VOLTAREN 1 % Gel  Generic drug:  diclofenac sodium  Apply 2 g topically 3 (three) times daily as  needed.       No Known Allergies     Follow-up Information   Follow up with Lesleigh Noe, MD. Schedule an appointment as soon as possible for a visit in 1 week.   Specialty:  Cardiology   Contact information:   [address] [place] [address] [phone]        The results of significant diagnostics from this hospitalization (including imaging, microbiology, ancillary and laboratory) are listed below for reference.    Significant Diagnostic Studies: Dg Chest 2 View  09/30/2013   CLINICAL DATA:  Shortness of breath, abdominal pain.  EXAM: CHEST  2 VIEW  COMPARISON:  02/26/2009  FINDINGS: Patchy bilateral perihilar opacities, right greater than left. Increasing left lower lobe airspace opacities. Findings concerning for pneumonia. Small bilateral pleural effusions noted on the lateral view. Heart is upper limits normal in size. No acute bony abnormality.  IMPRESSION: Patchy bilateral perihilar and left lower lobe opacities or pneumonia. Small bilateral effusions.   Electronically Signed   By: Charlett Nose M.D.   On: 09/30/2013 17:58    Microbiology: Recent Results (from the past 240 hour(s))  CULTURE, BLOOD (ROUTINE X 2)     Status: None   Collection Time    09/30/13 11:45 PM      Result Value Range Status   Specimen Description BLOOD LEFT ARM   Final   Special Requests BOTTLES DRAWN AEROBIC AND ANAEROBIC 5CC EA   Final   Culture  Setup Time     Final   Value: 10/01/2013 02:35     Performed at [facility]   Culture     Final   Value:        BLOOD CULTURE RECEIVED NO GROWTH TO DATE CULTURE WILL BE HELD FOR 5 DAYS BEFORE ISSUING A FINAL NEGATIVE REPORT     Performed at [facility]   Report Status PENDING   Incomplete  CULTURE, BLOOD (ROUTINE X 2)     Status: None   Collection Time    09/30/13 11:50 PM      Result Value Range Status   Specimen Description BLOOD RIGHT ARM   Final   Special Requests BOTTLES DRAWN AEROBIC AND  ANAEROBIC 5CC EA   Final   Culture  Setup Time     Final   Value: 10/01/2013 02:35     Performed at [facility]   Culture     Final   Value:        BLOOD CULTURE RECEIVED NO GROWTH TO DATE CULTURE WILL BE HELD FOR 5 DAYS BEFORE ISSUING A FINAL NEGATIVE REPORT     Performed  at [facility]   Report Status PENDING   Incomplete     Labs: Basic Metabolic Panel:  Recent Labs Lab 09/30/13 1700 10/01/13 0425  NA 137 141  K 4.7 4.6  CL 105 107  CO2 22 24  GLUCOSE 90 83  BUN 62* 60*  CREATININE 2.92* 2.93*  CALCIUM 9.8 9.8   Liver Function Tests: No results found for this basename: AST, ALT, ALKPHOS, BILITOT, PROT, ALBUMIN,  in the last 168 hours No results found for this basename: LIPASE, AMYLASE,  in the last 168 hours No results found for this basename: AMMONIA,  in the last 168 hours CBC:  Recent Labs Lab 09/30/13 1700 10/01/13 0425 10/01/13 0725 10/02/13 0450  WBC 8.2 8.0 7.7 6.9  NEUTROABS 5.8  --   --   --   HGB 13.0 12.6* 13.1 12.8*  HCT 38.2* 36.6* 37.8* 37.5*  MCV 98.5 98.1 97.9 99.5  PLT 187 180 184 177   Cardiac Enzymes:  Recent Labs Lab 09/30/13 1700 09/30/13 2140 10/01/13 0425 10/01/13 1129  TROPONINI <0.30 <0.30 <0.30 <0.30   BNP: BNP (last 3 results)  Recent Labs  09/30/13 1700  PROBNP 6146.0*   CBG: No results found for this basename: GLUCAP,  in the last 168 hours     Signed:  Jonette Mate N  [facility] 10/02/2013, 10:34 AM

## 2013-10-07 LAB — CULTURE, BLOOD (ROUTINE X 2): Culture: NO GROWTH

## 2013-10-08 ENCOUNTER — Encounter: Payer: Self-pay | Admitting: *Deleted

## 2013-10-08 ENCOUNTER — Encounter: Payer: Self-pay | Admitting: Interventional Cardiology

## 2013-10-09 ENCOUNTER — Ambulatory Visit (INDEPENDENT_AMBULATORY_CARE_PROVIDER_SITE_OTHER): Payer: BC Managed Care – PPO | Admitting: Interventional Cardiology

## 2013-10-09 ENCOUNTER — Encounter: Payer: Self-pay | Admitting: Interventional Cardiology

## 2013-10-09 VITALS — BP 140/80 | HR 63 | Ht 72.0 in | Wt 226.0 lb

## 2013-10-09 DIAGNOSIS — I5032 Chronic diastolic (congestive) heart failure: Secondary | ICD-10-CM | POA: Insufficient documentation

## 2013-10-09 DIAGNOSIS — N184 Chronic kidney disease, stage 4 (severe): Secondary | ICD-10-CM

## 2013-10-09 DIAGNOSIS — Z7901 Long term (current) use of anticoagulants: Secondary | ICD-10-CM

## 2013-10-09 DIAGNOSIS — I10 Essential (primary) hypertension: Secondary | ICD-10-CM

## 2013-10-09 DIAGNOSIS — I4891 Unspecified atrial fibrillation: Secondary | ICD-10-CM

## 2013-10-09 MED ORDER — AMIODARONE HCL 200 MG PO TABS: 200.0000 mg | ORAL_TABLET | Freq: Every day | ORAL | Status: DC

## 2013-10-09 MED ORDER — APIXABAN 2.5 MG PO TABS: 2.5000 mg | ORAL_TABLET | Freq: Two times a day (BID) | ORAL | Status: DC

## 2013-10-09 NOTE — Progress Notes (Signed)
Patient ID: Trevor Burch, male   DOB: 10-12-1931, 79 y.o.   MRN: [id number]    [address] [place], [place]  [address] Phone: [phone] Fax:  [phone]  Date:  10/09/2013   ID:  Trevor Burch, DOB [birth year], MRN [id number]  PCP:  Farris Has, MD   ASSESSMENT:  1. Atrial fibrillation of unknown duration with rate control on a timolol and amiodarone 2. Chronic oral anticoagulation, Eliquis 3. Hypertension 4. Nonobstructive coronary disease 5. Stage IV chronic kidney disease  PLAN:  1. Decrease amiodarone to 200 mg per day on 10/14/13 2. Continue anticoagulation 3. Return to office visit in 2 weeks, and if still in atrial fibrillation, we will set up elective electrical cardioversion after a total of 4 weeks anticoagulation   SUBJECTIVE: Trevor Burch is a 79 y.o. male who is feeling better. He was admitted recently with atrial fibrillation of unknown duration. There was dyspnea and fatigue. He was started on medications to slow his rate as well as amiodarone to serve as a rhythm control agent encase electrical cardioversion he comes necessary. His rate is, under good control, his energy is improved, and exertional dyspnea has decreased. There is no peripheral edema. He denies orthopnea PND. He has been somewhat unsteady on his feet and difficulty with his equilibrium. This predated the current therapy although it has been more intense since discharge from the hospital.   Wt Readings from Last 3 Encounters:  10/09/13 226 lb (102.513 kg)  10/02/13 220 lb 4.8 oz (99.927 kg)     Past Medical History  Diagnosis Date  . Kidney function abnormal     has approx. 20% function - stablized x several years  . GERD (gastroesophageal reflux disease)   . Hypertension   . Atrial fibrillation   . Community acquired pneumonia   . Hypercholesteremia   . Allergic rhinitis     Current Outpatient Prescriptions  Medication Sig Dispense Refill  . amiodarone  (PACERONE) 200 MG tablet Take 1 tablet (200 mg total) by mouth 2 (two) times daily.  60 tablet  0  . amLODipine (NORVASC) 5 MG tablet Take 5 mg by mouth 2 (two) times daily.      Marland Kitchen apixaban (ELIQUIS) 2.5 MG TABS tablet Take 1 tablet (2.5 mg total) by mouth 2 (two) times daily.  60 tablet  2  . atenolol (TENORMIN) 25 MG tablet Take 25 mg by mouth daily.      . CEPHALEXIN PO Take by mouth. As directed      . Coenzyme Q10 50 MG CAPS Take 1 capsule by mouth daily.      . fish oil-omega-3 fatty acids 1000 MG capsule Take 2 g by mouth daily.      . furosemide (LASIX) 80 MG tablet Take 80 mg by mouth 2 (two) times daily.      . Multiple Vitamin (MULTIVITAMIN WITH MINERALS) TABS tablet Take 1 tablet by mouth daily.      Marland Kitchen omeprazole-sodium bicarbonate (ZEGERID) 40-1100 MG per capsule Take 1 capsule by mouth daily before breakfast.       . VOLTAREN 1 % GEL Apply 2 g topically 3 (three) times daily as needed.        No current facility-administered medications for this visit.    Allergies:   No Known Allergies  Social History:  The patient  reports that he has never smoked. He has never used smokeless tobacco. He reports that he drinks alcohol. He reports  that he does not use illicit drugs.   ROS:  Please see the history of present illness.   No melena, hematuria, hemoptysis, or falls. Denies abdominal discomfort and transient neurological symptoms. No chest pain.   All other systems reviewed and negative.   OBJECTIVE: VS:  BP 140/80  Pulse 63  Ht 6' (1.829 m)  Wt 226 lb (102.513 kg)  BMI 30.64 kg/m2 Well nourished, well developed, in no acute distress, and appears congruent with age HEENT: normal Neck: JVD flat. Carotid bruit absent  Cardiac:  normal S1, S2; RRR; no murmur Lungs:  clear to auscultation bilaterally, no wheezing, rhonchi or rales Abd: soft, nontender, no hepatomegaly Ext: Edema absent. Pulses 2+ Skin: warm and dry Neuro:  CNs 2-12 intact, no focal abnormalities noted  EKG:   Atrial fibrillation with ventricular response at 63 beats per minute. Right axis.       Signed, Darci Needle III, MD 10/09/2013 4:57 PM

## 2013-10-09 NOTE — Patient Instructions (Addendum)
Decrease Amiodarone to 200mg  daily starting 10/14/13  Continue to take all other medications as prescribed  A refill of Eliquis has been sent to your mail order pharmacy  You have a 2 week follow up appt 10/23/13 @ 3:45pm

## 2013-10-21 ENCOUNTER — Other Ambulatory Visit: Payer: Self-pay | Admitting: Family Medicine

## 2013-10-21 ENCOUNTER — Ambulatory Visit
Admission: RE | Admit: 2013-10-21 | Discharge: 2013-10-21 | Disposition: A | Discharge status: Home / Self Care | Payer: Medicare Other | Source: Ambulatory Visit | Attending: Family Medicine | Admitting: Family Medicine

## 2013-10-21 DIAGNOSIS — R0602 Shortness of breath: Secondary | ICD-10-CM

## 2013-10-23 ENCOUNTER — Encounter: Payer: Self-pay | Admitting: Interventional Cardiology

## 2013-10-23 ENCOUNTER — Ambulatory Visit (INDEPENDENT_AMBULATORY_CARE_PROVIDER_SITE_OTHER): Payer: BC Managed Care – PPO | Admitting: Interventional Cardiology

## 2013-10-23 VITALS — BP 140/61 | HR 63 | Ht 72.0 in | Wt 239.0 lb

## 2013-10-23 DIAGNOSIS — I4891 Unspecified atrial fibrillation: Secondary | ICD-10-CM

## 2013-10-23 DIAGNOSIS — Z7901 Long term (current) use of anticoagulants: Secondary | ICD-10-CM

## 2013-10-23 DIAGNOSIS — I5032 Chronic diastolic (congestive) heart failure: Secondary | ICD-10-CM

## 2013-10-23 DIAGNOSIS — I10 Essential (primary) hypertension: Secondary | ICD-10-CM

## 2013-10-23 MED ORDER — AMIODARONE HCL 200 MG PO TABS: 200.0000 mg | ORAL_TABLET | Freq: Two times a day (BID) | ORAL | Status: DC

## 2013-10-23 MED ORDER — FUROSEMIDE 80 MG PO TABS: 120.0000 mg | ORAL_TABLET | Freq: Two times a day (BID) | ORAL | Status: DC

## 2013-10-23 NOTE — Progress Notes (Signed)
Patient ID: Trevor Burch, male   DOB: [birth year], 79 y.o.   MRN: [id number]    [address] [place], [place]  [address] Phone: [phone] Fax:  [phone]  Date:  10/23/2013   ID:  Trevor Burch, DOB May 09, 1931, MRN [id number]  PCP:  Darrow Bussing, MD   ASSESSMENT:  1. Persistent atrial fibrillation with controlled ventricular response 2. Acute on chronic diastolic heart failure 3. Chronic renal insufficiency, patient has one functioning kidney 4. Blood pressure is borderline control  PLAN:  1. increase amiodarone to 200 mg twice a day 2. Increase furosemide from 80 mg twice a day to 120 mg twice a day 3. Arrange cardioversion for 7-10 days from this date. The electrical cardioversion and attendant risks were discussed with the patient and his wife in detail. We discussed the need for anesthesia and the possibility of mechanical injury. Will also discuss the greater than 90% success rate but were frank about the possibility of a failed procedure. We discussed anesthesia related complications 4. Basic metabolic panel in 4 days 5. The patient should call if lightheadedness, dizziness, or generally feeling worse than he does now.   SUBJECTIVE: Trevor Burch is a 79 y.o. male who complains of dyspnea and fatigue since being discharged from the hospital. He has gained nearly 20 pounds since discharge. He is in atrial fibrillation and is loading with amiodarone. He denies chest discomfort. He gives out of energy and become short of breath with activity.   Wt Readings from Last 3 Encounters:  10/23/13 239 lb (108.41 kg)  10/09/13 226 lb (102.513 kg)  10/02/13 220 lb 4.8 oz (99.927 kg)     Past Medical History  Diagnosis Date  . Kidney function abnormal     has approx. 20% function - stablized x several years  . GERD (gastroesophageal reflux disease)   . Hypertension   . Atrial fibrillation   . Community acquired pneumonia   . Hypercholesteremia   . Allergic  rhinitis     Current Outpatient Prescriptions  Medication Sig Dispense Refill  . amiodarone (PACERONE) 200 MG tablet Take 1 tablet (200 mg total) by mouth 2 (two) times daily.      Marland Kitchen amLODipine (NORVASC) 5 MG tablet Take 5 mg by mouth 2 (two) times daily.      Marland Kitchen amoxicillin-clavulanate (AUGMENTIN) 875-125 MG per tablet       . apixaban (ELIQUIS) 2.5 MG TABS tablet Take 1 tablet (2.5 mg total) by mouth 2 (two) times daily.  180 tablet  3  . atenolol (TENORMIN) 25 MG tablet Take 25 mg by mouth daily.      . CEPHALEXIN PO Take by mouth. As directed      . Coenzyme Q10 50 MG CAPS Take 1 capsule by mouth daily.      . fish oil-omega-3 fatty acids 1000 MG capsule Take 2 g by mouth daily.      . furosemide (LASIX) 80 MG tablet Take 1.5 tablets (120 mg total) by mouth 2 (two) times daily.      . Multiple Vitamin (MULTIVITAMIN WITH MINERALS) TABS tablet Take 1 tablet by mouth daily.      Marland Kitchen omeprazole-sodium bicarbonate (ZEGERID) 40-1100 MG per capsule Take 1 capsule by mouth daily before breakfast.       . VOLTAREN 1 % GEL Apply 2 g topically 3 (three) times daily as needed.        No current facility-administered medications for this visit.  Allergies:   No Known Allergies  Social History:  The patient  reports that he has never smoked. He has never used smokeless tobacco. He reports that he drinks alcohol. He reports that he does not use illicit drugs.   ROS:  Please see the history of present illness.   No bleeding complications on anticoagulation therapy. Denies neurological complaints. Has gained daily 20 pounds since discharge from the hospital.   All other systems reviewed and negative.   OBJECTIVE: VS:  BP 140/61  Pulse 63  Ht 6' (1.829 m)  Wt 239 lb (108.41 kg)  BMI 32.41 kg/m2 Well nourished, well developed, in no acute distress HEENT: normal Neck: JVD moderate elevation. Carotid bruit absent  Cardiac:  normal S1, S2; IIRR; no murmur Lungs:  clear to auscultation bilaterally,  no wheezing, rhonchi or rales Abd: soft, nontender, no hepatomegaly Ext: Edema  2+ bilateral . Pulses 2+  Skin: warm and dry Neuro:  CNs 2-12 intact, no focal abnormalities noted  EKG:  Not performed       Signed, Darci Needle III, MD 10/23/2013 4:45 PM

## 2013-10-23 NOTE — Patient Instructions (Signed)
Increase Lasix to 120mg  twice daily  Increase Amiodarone to 200mg  twice daily  Your physician recommends that you return for lab work on 10/28/13 (bmet)  Your physician has recommended that you have a Cardioversion (DCCV). Electrical Cardioversion uses a jolt of electricity to your heart either through paddles or wired patches attached to your chest. This is a controlled, usually prescheduled, procedure. Defibrillation is done under light anesthesia in the hospital, and you usually go home the day of the procedure. This is done to get your heart back into a normal rhythm. You are not awake for the procedure. Please see the instruction sheet given to you today.( scheduled for 11/01/13 @ 11am)

## 2013-10-25 ENCOUNTER — Other Ambulatory Visit: Payer: Self-pay | Admitting: Interventional Cardiology

## 2013-10-25 ENCOUNTER — Encounter: Payer: Self-pay | Admitting: Interventional Cardiology

## 2013-10-25 DIAGNOSIS — I4891 Unspecified atrial fibrillation: Secondary | ICD-10-CM

## 2013-10-28 ENCOUNTER — Telehealth: Payer: Self-pay | Admitting: Nurse Practitioner

## 2013-10-28 ENCOUNTER — Other Ambulatory Visit (INDEPENDENT_AMBULATORY_CARE_PROVIDER_SITE_OTHER): Payer: BC Managed Care – PPO

## 2013-10-28 DIAGNOSIS — I4891 Unspecified atrial fibrillation: Secondary | ICD-10-CM

## 2013-10-28 DIAGNOSIS — I5032 Chronic diastolic (congestive) heart failure: Secondary | ICD-10-CM

## 2013-10-28 LAB — BASIC METABOLIC PANEL
CO2: 25 mEq/L (ref 19–32)
GFR: 13.94 mL/min — CL (ref >60.00)
Glucose, Bld: 104 mg/dL — ABNORMAL HIGH (ref 70–99)
Potassium: 4.5 mEq/L (ref 3.5–5.1)
Sodium: 137 mEq/L (ref 135–145)

## 2013-10-28 NOTE — Telephone Encounter (Signed)
Received call from lab of critical GFR.  I printed BMET and took to Dr. Jens Som, DOD who advised patient hold evening and morning Lasix and notify Dr. Katrinka Blazing of advice.  Dr. Katrinka Blazing will return to office tomorrow.   I spoke with patient's wife, Steward Drone who states patient is in the shower.  I notified Steward Drone of Dr. Ludwig Clarks advice and that someone from our office will call tomorrow once Dr. Katrinka Blazing has reviewed the labs and plan.  Steward Drone verbalized understanding and repeated instructions back to me.  I am sending message to Dr. Katrinka Blazing and his primary CMA, [facility].

## 2013-10-29 ENCOUNTER — Telehealth: Payer: Self-pay

## 2013-10-29 DIAGNOSIS — I4891 Unspecified atrial fibrillation: Secondary | ICD-10-CM

## 2013-10-29 NOTE — Telephone Encounter (Signed)
Dr.Smith reveiewed pt labs. called pt with Dr.Smith instructions. Hold asix through 10/31/13. pt to come into the office for lab(bmet) 10/31/13.pt will be given further instructions.pending lab results. pt dccv sch for 11/01/13 is on as scheduled.pt verbalized understanding.

## 2013-10-30 ENCOUNTER — Other Ambulatory Visit: Payer: BC Managed Care – PPO

## 2013-10-31 ENCOUNTER — Telehealth: Payer: Self-pay

## 2013-10-31 ENCOUNTER — Other Ambulatory Visit (INDEPENDENT_AMBULATORY_CARE_PROVIDER_SITE_OTHER): Payer: BC Managed Care – PPO

## 2013-10-31 ENCOUNTER — Inpatient Hospital Stay (HOSPITAL_COMMUNITY)
Admission: AD | Admit: 2013-10-31 | Discharge: 2013-11-04 | DRG: 291 | Disposition: A | Discharge status: Home / Self Care | Payer: BC Managed Care – PPO | Source: Ambulatory Visit | Attending: Interventional Cardiology | Admitting: Interventional Cardiology

## 2013-10-31 DIAGNOSIS — I120 Hypertensive chronic kidney disease with stage 5 chronic kidney disease or end stage renal disease: Secondary | ICD-10-CM | POA: Diagnosis present

## 2013-10-31 DIAGNOSIS — N186 End stage renal disease: Secondary | ICD-10-CM | POA: Diagnosis present

## 2013-10-31 DIAGNOSIS — I10 Essential (primary) hypertension: Secondary | ICD-10-CM

## 2013-10-31 DIAGNOSIS — I5033 Acute on chronic diastolic (congestive) heart failure: Secondary | ICD-10-CM

## 2013-10-31 DIAGNOSIS — IMO0001 Reserved for inherently not codable concepts without codable children: Principal | ICD-10-CM | POA: Diagnosis present

## 2013-10-31 DIAGNOSIS — I509 Heart failure, unspecified: Secondary | ICD-10-CM | POA: Diagnosis present

## 2013-10-31 DIAGNOSIS — E7800 Pure hypercholesterolemia, unspecified: Secondary | ICD-10-CM | POA: Diagnosis present

## 2013-10-31 DIAGNOSIS — I440 Atrioventricular block, first degree: Secondary | ICD-10-CM | POA: Diagnosis present

## 2013-10-31 DIAGNOSIS — Z7901 Long term (current) use of anticoagulants: Secondary | ICD-10-CM

## 2013-10-31 DIAGNOSIS — Z79899 Other long term (current) drug therapy: Secondary | ICD-10-CM

## 2013-10-31 DIAGNOSIS — I4891 Unspecified atrial fibrillation: Secondary | ICD-10-CM

## 2013-10-31 DIAGNOSIS — N184 Chronic kidney disease, stage 4 (severe): Secondary | ICD-10-CM

## 2013-10-31 DIAGNOSIS — K219 Gastro-esophageal reflux disease without esophagitis: Secondary | ICD-10-CM | POA: Diagnosis present

## 2013-10-31 DIAGNOSIS — N139 Obstructive and reflux uropathy, unspecified: Secondary | ICD-10-CM | POA: Diagnosis present

## 2013-10-31 DIAGNOSIS — N179 Acute kidney failure, unspecified: Secondary | ICD-10-CM

## 2013-10-31 HISTORY — DX: Inflammatory liver disease, unspecified: K75.9

## 2013-10-31 HISTORY — DX: Heart failure, unspecified: I50.9

## 2013-10-31 HISTORY — DX: Unspecified osteoarthritis, unspecified site: M19.90

## 2013-10-31 HISTORY — DX: Chronic kidney disease, unspecified: N18.9

## 2013-10-31 LAB — BASIC METABOLIC PANEL
CO2: 26 mEq/L (ref 19–32)
Chloride: 102 mEq/L (ref 96–112)
Creatinine, Ser: 4.4 mg/dL — ABNORMAL HIGH (ref 0.4–1.5)
Glucose, Bld: 94 mg/dL (ref 70–99)
Potassium: 4.6 mEq/L (ref 3.5–5.1)
Sodium: 136 mEq/L (ref 135–145)

## 2013-10-31 LAB — COMPREHENSIVE METABOLIC PANEL
ALT: 27 U/L (ref 0–53)
AST: 24 U/L (ref 0–37)
Albumin: 2.9 g/dL — ABNORMAL LOW (ref 3.5–5.2)
Alkaline Phosphatase: 86 U/L (ref 39–117)
BUN: 72 mg/dL — ABNORMAL HIGH (ref 6–23)
Calcium: 9.5 mg/dL (ref 8.4–10.5)
Chloride: 100 mEq/L (ref 96–112)
Potassium: 4.3 mEq/L (ref 3.5–5.1)
Sodium: 134 mEq/L — ABNORMAL LOW (ref 135–145)
Total Bilirubin: 0.5 mg/dL (ref 0.3–1.2)
Total Protein: 7.3 g/dL (ref 6.0–8.3)

## 2013-10-31 LAB — MAGNESIUM: Magnesium: 2.5 mg/dL (ref 1.5–2.5)

## 2013-10-31 LAB — PROTIME-INR: Prothrombin Time: 14.8 seconds (ref 11.6–15.2)

## 2013-10-31 LAB — PRO B NATRIURETIC PEPTIDE: Pro B Natriuretic peptide (BNP): 7072 pg/mL — ABNORMAL HIGH (ref 0–450)

## 2013-10-31 MED ORDER — AMLODIPINE BESYLATE 5 MG PO TABS
5.0000 mg | ORAL_TABLET | Freq: Two times a day (BID) | ORAL | Status: DC
  Administered 2013-10-31 – 2013-11-04 (×8): 5 mg via ORAL
  Filled 2013-10-31 (×9): qty 1

## 2013-10-31 MED ORDER — ONDANSETRON HCL 4 MG/2ML IJ SOLN: 4.0000 mg | Freq: Four times a day (QID) | INTRAMUSCULAR | Status: DC | PRN

## 2013-10-31 MED ORDER — HYDROCORTISONE 1 % EX CREA
1.0000 "application " | TOPICAL_CREAM | Freq: Three times a day (TID) | CUTANEOUS | Status: DC | PRN
  Filled 2013-10-31: qty 28

## 2013-10-31 MED ORDER — SODIUM CHLORIDE 0.9 % IJ SOLN
3.0000 mL | Freq: Two times a day (BID) | INTRAMUSCULAR | Status: DC
  Administered 2013-10-31 – 2013-11-03 (×4): 3 mL via INTRAVENOUS

## 2013-10-31 MED ORDER — SODIUM CHLORIDE 0.9 % IJ SOLN
3.0000 mL | Freq: Two times a day (BID) | INTRAMUSCULAR | Status: DC
  Administered 2013-11-01 – 2013-11-03 (×2): 3 mL via INTRAVENOUS

## 2013-10-31 MED ORDER — APIXABAN 2.5 MG PO TABS
2.5000 mg | ORAL_TABLET | Freq: Two times a day (BID) | ORAL | Status: DC
  Administered 2013-10-31 – 2013-11-04 (×8): 2.5 mg via ORAL
  Filled 2013-10-31 (×9): qty 1

## 2013-10-31 MED ORDER — ACETAMINOPHEN 325 MG PO TABS: 650.0000 mg | ORAL_TABLET | ORAL | Status: DC | PRN

## 2013-10-31 MED ORDER — OMEGA-3 FATTY ACIDS 1000 MG PO CAPS: 2.0000 g | ORAL_CAPSULE | Freq: Every day | ORAL | Status: DC

## 2013-10-31 MED ORDER — AMIODARONE HCL 200 MG PO TABS
200.0000 mg | ORAL_TABLET | Freq: Two times a day (BID) | ORAL | Status: DC
  Administered 2013-10-31 – 2013-11-04 (×8): 200 mg via ORAL
  Filled 2013-10-31 (×9): qty 1

## 2013-10-31 MED ORDER — ADULT MULTIVITAMIN W/MINERALS CH
1.0000 | ORAL_TABLET | Freq: Every day | ORAL | Status: DC
  Administered 2013-10-31 – 2013-11-04 (×4): 1 via ORAL
  Filled 2013-10-31 (×5): qty 1

## 2013-10-31 MED ORDER — SODIUM CHLORIDE 0.9 % IV SOLN
250.0000 mL | INTRAVENOUS | Status: DC
  Administered 2013-10-31: 22:00:00 250 mL via INTRAVENOUS

## 2013-10-31 MED ORDER — DEXTROSE 5 % IV SOLN
120.0000 mg | Freq: Two times a day (BID) | INTRAVENOUS | Status: AC
  Administered 2013-10-31 – 2013-11-01 (×2): 120 mg via INTRAVENOUS
  Filled 2013-10-31 (×3): qty 12

## 2013-10-31 MED ORDER — SODIUM CHLORIDE 0.9 % IJ SOLN: 3.0000 mL | INTRAMUSCULAR | Status: DC | PRN

## 2013-10-31 MED ORDER — PANTOPRAZOLE SODIUM 40 MG PO TBEC
40.0000 mg | DELAYED_RELEASE_TABLET | Freq: Every day | ORAL | Status: DC
  Administered 2013-10-31 – 2013-11-04 (×5): 40 mg via ORAL
  Filled 2013-10-31 (×3): qty 1

## 2013-10-31 MED ORDER — OMEGA-3-ACID ETHYL ESTERS 1 G PO CAPS
2.0000 g | ORAL_CAPSULE | Freq: Every day | ORAL | Status: DC
  Administered 2013-10-31 – 2013-11-04 (×5): 2 g via ORAL
  Filled 2013-10-31 (×5): qty 2

## 2013-10-31 MED ORDER — SODIUM CHLORIDE 0.9 % IV SOLN: 250.0000 mL | INTRAVENOUS | Status: DC | PRN

## 2013-10-31 MED ORDER — ATENOLOL 25 MG PO TABS
25.0000 mg | ORAL_TABLET | Freq: Every day | ORAL | Status: DC
  Administered 2013-11-01 – 2013-11-04 (×4): 25 mg via ORAL
  Filled 2013-10-31 (×4): qty 1

## 2013-10-31 NOTE — Telephone Encounter (Signed)
pt instructed by Dr.Smith to report to [facility] admitting [address] [facility].pt verbalized understanding.

## 2013-10-31 NOTE — H&P (Signed)
Trevor Burch is a 79 y.o. male  Admit Date:10/31/2013 Referring Physician: Dewayne Hatch, MD Primary Cardiologist:: Louretta Shorten Katrinka Burch, M.D. Chief complaint / reason for admission: Acute on chronic diastolic heart failure and acute on chronic kidney failure  HPI: Trevor Burch is 79 years of age and has a 3 to four-week history of atrial fibrillation with associated acute on chronic diastolic heart failure. He is being loaded with amiodarone and is to have electrical cardioversion tomorrow. He has had progressive weight gain including abdominal distention. Renal function has deteriorated with creatinine rising from 2.9 to 4.4. He is not responding to oral diuretic therapy. According to his wife he is having progressive dyspnea and orthopnea. The cardioversion was to be done as an outpatient tomorrow but after speaking with him this evening I decided to admit and give several doses of IV diuretic to see if this will help mobilize fluid. He denies chest pain.    PMH:    Past Medical History  Diagnosis Date  . Kidney function abnormal     has approx. 20% function - stablized x several years  . GERD (gastroesophageal reflux disease)   . Hypertension   . Atrial fibrillation   . Community acquired pneumonia   . Hypercholesteremia   . Allergic rhinitis     PSH:    Past Surgical History  Procedure Laterality Date  . Appendectomy    . Tonsillectomy    . Prostate surgery      for enlarged prostate and blockages - no cancer   ALLERGIES:   Review of patient's allergies indicates no known allergies. Prior to Admit Meds:   Prescriptions prior to admission  Medication Sig Dispense Refill  . amiodarone (PACERONE) 200 MG tablet Take 1 tablet (200 mg total) by mouth 2 (two) times daily.      Marland Kitchen amLODipine (NORVASC) 5 MG tablet Take 5 mg by mouth 2 (two) times daily.      Marland Kitchen amoxicillin-clavulanate (AUGMENTIN) 875-125 MG per tablet       . apixaban (ELIQUIS) 2.5 MG TABS tablet Take 1 tablet (2.5 mg  total) by mouth 2 (two) times daily.  180 tablet  3  . atenolol (TENORMIN) 25 MG tablet Take 25 mg by mouth daily.      . CEPHALEXIN PO Take by mouth. As directed      . Coenzyme Q10 50 MG CAPS Take 1 capsule by mouth daily.      . fish oil-omega-3 fatty acids 1000 MG capsule Take 2 g by mouth daily.      . furosemide (LASIX) 80 MG tablet Take 1.5 tablets (120 mg total) by mouth 2 (two) times daily.      . Multiple Vitamin (MULTIVITAMIN WITH MINERALS) TABS tablet Take 1 tablet by mouth daily.      Marland Kitchen omeprazole-sodium bicarbonate (ZEGERID) 40-1100 MG per capsule Take 1 capsule by mouth daily before breakfast.       . VOLTAREN 1 % GEL Apply 2 g topically 3 (three) times daily as needed.        Family HX:    Family History  Problem Relation Age of Onset  . Heart disease Father    Social HX:    History   Social History  . Marital Status: Married    Spouse Name: N/A    Number of Children: N/A  . Years of Education: N/A   Occupational History  . Not on file.   Social History Main Topics  .  Smoking status: Never Smoker   . Smokeless tobacco: Never Used  . Alcohol Use: Yes     Comment: glass wine approx. 6 days week  . Drug Use: No  . Sexual Activity: Not on file   Other Topics Concern  . Not on file   Social History Narrative  . No narrative on file     ROS orthopnea, lower extremity swelling, exertional fatigue, insomnia, inability to walk distances because of fatigue and dyspnea  Physical Exam: Blood pressure 131/87, pulse 75, temperature 98.3 F (36.8 C), temperature source Oral, resp. rate 18, height 6' (1.829 m), weight 244 lb 0.8 oz (110.7 kg), SpO2 92.00%.     HEENT unremarkable NECK with ecchymoses CHEST with bilat rales at bases CARDIAC exam isunremarkable Abdominal distention. 2-3+ bilateral lower extremity edema. Alert and oriented x 3. No motor or sensory abnormality Labs: Lab Results  Component Value Date   WBC 6.9 10/02/2013   HGB 12.8* 10/02/2013    HCT 37.5* 10/02/2013   MCV 99.5 10/02/2013   PLT 177 10/02/2013     Recent Labs Lab 10/31/13 2040  NA 134*  K 4.3  CL 100  CO2 23  BUN 72*  CREATININE 4.32*  CALCIUM 9.5  PROT 7.3  BILITOT 0.5  ALKPHOS 86  ALT 27  AST 24  GLUCOSE 130*   Lab Results  Component Value Date   CKTOTAL 141 09/03/2007   CKMB 9.6* 09/03/2007   TROPONINI <0.30 10/01/2013     Radiology:  Not yet performed  EKG:  Not yet performed  ASSESSMENT: 1. Acute on chronic diastolic heart failure, not responding to diuretic therapy 2. Atrial fibrillation with rate control, relatively new onset within the past 4 weeks. 3. Acute on chronic kidney failure with GFR now less than 20. The patient essentially has end-stage renal failure. Hopefully renal function will improve with cardioversion and restoration of normal cardiac output.  Plan:  1. IV Lasix 2. N.p.o. after midnight for electrical cardioversion tomorrow 3. Continue amiodarone and other chronic maintenance therapy. 4. Overall condition is serious Trevor Burch 10/31/2013 10:33 PM

## 2013-10-31 NOTE — Telephone Encounter (Signed)
Called received form AJ at [facility] lab. Pt has a ctitical GFR 13.65. Lab reviewed by Dr.Smith. Pt admitted to [facility]

## 2013-11-01 ENCOUNTER — Encounter (HOSPITAL_COMMUNITY)
Admission: AD | Disposition: A | Discharge status: Home / Self Care | Payer: Self-pay | Source: Ambulatory Visit | Attending: Interventional Cardiology

## 2013-11-01 ENCOUNTER — Inpatient Hospital Stay (HOSPITAL_COMMUNITY): Payer: BC Managed Care – PPO | Admitting: Anesthesiology

## 2013-11-01 ENCOUNTER — Encounter (HOSPITAL_COMMUNITY): Payer: Self-pay | Admitting: General Practice

## 2013-11-01 ENCOUNTER — Ambulatory Visit (HOSPITAL_COMMUNITY)
Admission: RE | Admit: 2013-11-01 | Payer: BC Managed Care – PPO | Source: Ambulatory Visit | Admitting: Interventional Cardiology

## 2013-11-01 ENCOUNTER — Inpatient Hospital Stay (HOSPITAL_COMMUNITY): Payer: BC Managed Care – PPO

## 2013-11-01 ENCOUNTER — Encounter (HOSPITAL_COMMUNITY): Payer: BC Managed Care – PPO | Admitting: Anesthesiology

## 2013-11-01 DIAGNOSIS — I4891 Unspecified atrial fibrillation: Secondary | ICD-10-CM

## 2013-11-01 HISTORY — PX: CARDIOVERSION: SHX1299

## 2013-11-01 LAB — BASIC METABOLIC PANEL
BUN: 73 mg/dL — ABNORMAL HIGH (ref 6–23)
Chloride: 103 mEq/L (ref 96–112)
Creatinine, Ser: 4.28 mg/dL — ABNORMAL HIGH (ref 0.50–1.35)
GFR calc Af Amer: 14 mL/min — ABNORMAL LOW (ref >90)
Glucose, Bld: 95 mg/dL (ref 70–99)
Potassium: 4.2 mEq/L (ref 3.5–5.1)

## 2013-11-01 LAB — TSH: TSH: 1.835 u[IU]/mL (ref 0.350–4.500)

## 2013-11-01 SURGERY — CARDIOVERSION
Anesthesia: Monitor Anesthesia Care

## 2013-11-01 SURGERY — CARDIOVERSION
Anesthesia: General

## 2013-11-01 MED ORDER — SODIUM CHLORIDE 0.9 % IJ SOLN
3.0000 mL | Freq: Two times a day (BID) | INTRAMUSCULAR | Status: DC
  Administered 2013-11-02 – 2013-11-04 (×4): 3 mL via INTRAVENOUS

## 2013-11-01 MED ORDER — LIDOCAINE HCL (CARDIAC) 20 MG/ML IV SOLN
INTRAVENOUS | Status: DC | PRN
  Administered 2013-11-01: 50 mg via INTRAVENOUS

## 2013-11-01 MED ORDER — SODIUM CHLORIDE 0.9 % IJ SOLN: 3.0000 mL | INTRAMUSCULAR | Status: DC | PRN

## 2013-11-01 MED ORDER — FUROSEMIDE 10 MG/ML IJ SOLN
80.0000 mg | Freq: Two times a day (BID) | INTRAMUSCULAR | Status: DC
  Administered 2013-11-01 – 2013-11-02 (×2): 80 mg via INTRAVENOUS
  Filled 2013-11-01 (×4): qty 8

## 2013-11-01 MED ORDER — SODIUM CHLORIDE 0.9 % IV SOLN
INTRAVENOUS | Status: DC | PRN
  Administered 2013-11-01: 11:00:00 via INTRAVENOUS

## 2013-11-01 MED ORDER — SODIUM CHLORIDE 0.9 % IV SOLN
250.0000 mL | INTRAVENOUS | Status: DC
  Administered 2013-11-01: 10:00:00 250 mL via INTRAVENOUS

## 2013-11-01 MED ORDER — PROPOFOL 10 MG/ML IV BOLUS
INTRAVENOUS | Status: DC | PRN
  Administered 2013-11-01: 50 mg via INTRAVENOUS

## 2013-11-01 NOTE — Anesthesia Postprocedure Evaluation (Signed)
  Anesthesia Post-op Note  Patient: Trevor Burch  Procedure(s) Performed: Procedure(s): CARDIOVERSION (N/A)  Patient Location: PACU and Endoscopy Unit  Anesthesia Type:General  Level of Consciousness: awake, alert , oriented and patient cooperative  Airway and Oxygen Therapy: Patient Spontanous Breathing and Patient connected to nasal cannula oxygen  Post-op Pain: none  Post-op Assessment: Post-op Vital signs reviewed  Post-op Vital Signs: Reviewed and stable  Complications: No apparent anesthesia complications

## 2013-11-01 NOTE — Anesthesia Preprocedure Evaluation (Addendum)
Anesthesia Evaluation    Reviewed: Allergy & Precautions, H&P , NPO status , Patient's Chart, lab work & pertinent test results  History of Anesthesia Complications Negative for: history of anesthetic complications  Airway       Dental   Pulmonary pneumonia -, resolved,          Cardiovascular hypertension, Pt. on home beta blockers +CHF + dysrhythmias Atrial Fibrillation  Echo 10/01/2013: Study Conclusions - Left ventricle: The cavity size was normal. There was   moderate concentric hypertrophy. Systolic function was   normal. Wall motion was normal; there were no regional   wall motion abnormalities. - Aortic valve: Moderate thickening and calcification. There   was very mild stenosis. Mild regurgitation. Valve area:   1.88cm^2(VTI). Valve area: 1.98cm^2 (Vmax). - Mitral valve: Mild regurgitation.    Neuro/Psych negative neurological ROS  negative psych ROS   GI/Hepatic GERD-  Medicated,  Endo/Other  negative endocrine ROS  Renal/GU Renal InsufficiencyRenal disease     Musculoskeletal   Abdominal   Peds  Hematology   Anesthesia Other Findings   Reproductive/Obstetrics                         Anesthesia Physical Anesthesia Plan  ASA: III  Anesthesia Plan: General   Post-op Pain Management:    Induction: Intravenous  Airway Management Planned: Mask  Additional Equipment:   Intra-op Plan:   Post-operative Plan:   Informed Consent:   Plan Discussed with: CRNA, Anesthesiologist and Surgeon  Anesthesia Plan Comments:         Anesthesia Quick Evaluation

## 2013-11-01 NOTE — CV Procedure (Signed)
Electrical Cardioversion Procedure Note LAYMON STOCKERT [id number] November 08, 1931  Procedure: Electrical Cardioversion Indications:  Atrial Fibrillation  Time Out: Verified patient identification, verified procedure,medications/allergies/relevent history reviewed, required imaging and test results available.  Performed  Procedure Details  The patient was NPO after midnight. Anesthesia was administered at the beside  by Dr. Krista Blue with a 50 mg of propofol.  Cardioversion was done with synchronized biphasic defibrillation with AP pads with 200 watts.  The patient converted to normal sinus rhythm. The patient tolerated the procedure well   IMPRESSION:  Successful cardioversion of atrial fibrillation    Veatrice Kells W 11/01/2013, 12:39 PM

## 2013-11-01 NOTE — Transfer of Care (Signed)
Immediate Anesthesia Transfer of Care Note  Patient: Trevor Burch  Procedure(s) Performed: Procedure(s): CARDIOVERSION (N/A)  Patient Location: Nursing Unit  Anesthesia Type:General  Level of Consciousness: awake, alert , oriented and patient cooperative  Airway & Oxygen Therapy: Patient Spontanous Breathing and Patient connected to nasal cannula oxygen  Post-op Assessment: Report given to PACU RN, Post -op Vital signs reviewed and stable and Patient moving all extremities  Post vital signs: Reviewed and stable  Complications: No apparent anesthesia complications

## 2013-11-01 NOTE — Consult Note (Signed)
Trevor Burch 11/01/2013 Trevor Burch Requesting Physician:  Trevor Burch  Reason for Consult:  AoCKD, Edema HPI:  71M with CKD4 who I follow in clinic developed Comm Acq PNA and AFib about one month ago.  He was placed on apixaban and amiodarone but did not convert.  He has developed progressive edema, increased weight, fatigue, SOB, and worsened renal function.  He was admitted for diuresis and cardioversion.  Last evening he rec 120 IV lasix with good response.  He was successfully cardioverted this AM.    He feels improved after just one day of diuresis.  From admission 09/30/13 weight is up 6kg.  He has had tense pitting LEE into the thigs and abd with reduced appetite and early satiety.    He has subnephrotic proteinuria and an obstructive uropathy.     Creatinine, Ser (mg/dL)  Date Value  47/82/9562 4.28*  10/31/2013 4.32*  10/31/2013 4.4*  10/28/2013 4.4*  10/01/2013 2.93*  09/30/2013 2.92*  01/27/2009 2.80*  11/22/2007 2.82*  09/03/2007 2.43*  ] I/Os: I/O last 3 completed shifts: In: 302 [P.O.:240; IV Piggyback:62] Out: 1550 [Urine:1550]  ROS Balance of 12 systems is negative w/ exceptions as above  PMH  Past Medical History  Diagnosis Date  . Kidney function abnormal     has approx. 20% function - stablized x several years  . GERD (gastroesophageal reflux disease)   . Hypertension   . Atrial fibrillation   . Community acquired pneumonia   . Hypercholesteremia   . Allergic rhinitis   . Chronic kidney disease     RENAL INSUFFICENCY   . Arthritis     BILATERAL KNEES  . CHF (congestive heart failure)   . Hepatitis     HX OF HEP B   PSH  Past Surgical History  Procedure Laterality Date  . Appendectomy    . Tonsillectomy    . Prostate surgery      for enlarged prostate and blockages - no cancer   FH  Family History  Problem Relation Age of Onset  . Heart disease Father    SH  reports that he has never smoked. He has never used smokeless  tobacco. He reports that he drinks alcohol. He reports that he does not use illicit drugs. Allergies No Known Allergies Home medications Prior to Admission medications   Medication Sig Start Date End Date Taking? Authorizing Provider  amiodarone (PACERONE) 200 MG tablet Take 200 mg by mouth 2 (two) times daily.   Yes Historical Provider, MD  amLODipine (NORVASC) 5 MG tablet Take 5 mg by mouth 2 (two) times daily. 09/19/13  Yes Historical Provider, MD  apixaban (ELIQUIS) 2.5 MG TABS tablet Take 2.5 mg by mouth 2 (two) times daily.   Yes Historical Provider, MD  atenolol (TENORMIN) 25 MG tablet Take 25 mg by mouth daily. 09/11/13  Yes Historical Provider, MD  Coenzyme Q10 50 MG CAPS Take 1 capsule by mouth daily.   Yes Historical Provider, MD  diclofenac sodium (VOLTAREN) 1 % GEL Apply 2 g topically 3 (three) times daily as needed (inflammation/pain).   Yes Historical Provider, MD  fish oil-omega-3 fatty acids 1000 MG capsule Take 2 g by mouth daily.   Yes Historical Provider, MD  Multiple Vitamin (MULTIVITAMIN WITH MINERALS) TABS tablet Take 1 tablet by mouth daily.   Yes Historical Provider, MD  omeprazole-sodium bicarbonate (ZEGERID) 40-1100 MG per capsule Take 1 capsule by mouth daily before breakfast.  07/24/13  Yes Historical Provider, MD  Current Medications Current Facility-Administered Medications  Medication Dose Route Frequency Provider Last Rate Last Dose  . 0.9 %  sodium chloride infusion  250 mL Intravenous PRN Trevor Records III, MD      . 0.9 %  sodium chloride infusion  250 mL Intravenous Continuous Trevor Records III, MD 1 mL/hr at 10/31/13 2159 250 mL at 10/31/13 2159  . 0.9 %  sodium chloride infusion  250 mL Intravenous Continuous Trevor Records III, MD 1 mL/hr at 11/01/13 1014 250 mL at 11/01/13 1014  . acetaminophen (TYLENOL) tablet 650 mg  650 mg Oral Q4H PRN Trevor Records III, MD      . amiodarone (PACERONE) tablet 200 mg  200 mg Oral BID Trevor Records III, MD   200 mg at  11/01/13 0948  . amLODipine (NORVASC) tablet 5 mg  5 mg Oral BID Trevor Records III, MD   5 mg at 11/01/13 0948  . apixaban (ELIQUIS) tablet 2.5 mg  2.5 mg Oral BID Trevor Records III, MD   2.5 mg at 11/01/13 2130  . atenolol (TENORMIN) tablet 25 mg  25 mg Oral Daily Trevor Records III, MD   25 mg at 11/01/13 8657  . furosemide (LASIX) injection 80 mg  80 mg Intravenous Q12H Trevor Miss, MD      . hydrocortisone cream 1 % 1 application  1 application Topical TID PRN Trevor Records III, MD      . multivitamin with minerals tablet 1 tablet  1 tablet Oral Daily Trevor Records III, MD   1 tablet at 10/31/13 2204  . omega-3 acid ethyl esters (LOVAZA) capsule 2 g  2 g Oral Daily Trevor Records III, MD   2 g at 11/01/13 1612  . ondansetron (ZOFRAN) injection 4 mg  4 mg Intravenous Q6H PRN Trevor Records III, MD      . pantoprazole (PROTONIX) EC tablet 40 mg  40 mg Oral Daily Trevor Records III, MD   40 mg at 11/01/13 1612  . sodium chloride 0.9 % injection 3 mL  3 mL Intravenous Q12H Trevor Records III, MD   3 mL at 11/01/13 [phone]  . sodium chloride 0.9 % injection 3 mL  3 mL Intravenous PRN Trevor Records III, MD      . sodium chloride 0.9 % injection 3 mL  3 mL Intravenous Q12H Trevor Records III, MD   3 mL at 11/01/13 0951  . sodium chloride 0.9 % injection 3 mL  3 mL Intravenous PRN Trevor Records III, MD      . sodium chloride 0.9 % injection 3 mL  3 mL Intravenous Q12H Trevor Records III, MD      . sodium chloride 0.9 % injection 3 mL  3 mL Intravenous PRN Trevor Noe, MD        Basic Metabolic Panel  Recent Labs Lab 10/28/13 1418 10/31/13 1343 10/31/13 2040 11/01/13 0554  NA 137 136 134* 137  K 4.5 4.6 4.3 4.2  CL 103 102 100 103  CO2 25 26 23 21   GLUCOSE 104* 94 130* 95  BUN 75* 68* 72* 73*  CREATININE 4.4* 4.4* 4.32* 4.28*  CALCIUM 9.5 9.4 9.5 9.6    Physical Exam  Blood pressure 127/64, pulse 74, temperature 98 F (36.7 C), temperature source Oral, resp. rate 17, height 6' (1.829  m), weight 110.315 kg (243 lb 3.2 oz), SpO2 95.00%.  GEN: NAD ENT: NCAT. Good dentition EYES: EOMI, sclera clear CV: RRR, nl s1s2, no rub PULM: CTAB but diminishedi n bases, no crackles ABD: s/nt. Quiet BS SKIN: no rashes notes; some chronic venous stasis changes at shins EXT: 3+ pitting LEE in legs, thighs.   A/P 56M with AoCKD in setting of recent onset AFib (cardioverted 12/12), worsened edema, and likely some component of heart failure.   1. AoCKD: Suspect driven by hemodynamics / cardiac issues and hopefully we can address / have addressed.  I will write for lasix 80 IV BID.  He is on low Na diet.  We discussed utility of keeping legs elevated.  K, HCO3, BP, Resp status stable.  Discussed that renal function could improve or wosren with lasix.  Of note pt with obstructive uropathy so will need to keep vigilant for obstruction.  No [place] recently, will obtain.   2. Edema: largely as above.  Likely driven by his renal disease, Na intake, and AFib.  As above 3. Afib: hopefully will stay in NSR.  Of note, on apixaban.     Sabra Heck MD 11/01/2013, 4:23 PM

## 2013-11-01 NOTE — Progress Notes (Addendum)
       Patient Name: Trevor Burch Date of Encounter: 11/01/2013    SUBJECTIVE: The patient was seen earlier this morning prior to the electrical cardioversion. He states that the dose of IV Lasix at was administered lead II a dramatic increase in urine output and he feels that his breathing and extremity edema has improved over a relatively short time frame. He denies chest pain. He was able to sleep. He wants to eat.  TELEMETRY:  Atrial fibrillation prior to cardioversion Filed Vitals:   10/31/13 2213 11/01/13 0700 11/01/13 0948 11/01/13 0949  BP: 131/87 148/72 134/65   Pulse: 75 73  66  Temp: 98.3 F (36.8 C) 98.6 F (37 C)    TempSrc: Oral Oral    Resp:  18    Height:      Weight:  243 lb 3.2 oz (110.315 kg)    SpO2: 92% 95%      Intake/Output Summary (Last 24 hours) at 11/01/13 1232 Last data filed at 11/01/13 1120  Gross per 24 hour  Intake    352 ml  Output   1550 ml  Net  -1198 ml    LABS: Basic Metabolic Panel:  Recent Labs  16/10/96 1343 10/31/13 2040 11/01/13 0554  NA 136 134* 137  K 4.6 4.3 4.2  CL 102 100 103  CO2 26 23 21   GLUCOSE 94 130* 95  BUN 68* 72* 73*  CREATININE 4.4* 4.32* 4.28*  CALCIUM 9.4 9.5 9.6  MG  --  2.5  --    BNP    Component Value Date/Time   PROBNP 7072.0* 10/31/2013 2040    Radiology/Studies:  Not repeated  Physical Exam: Blood pressure 134/65, pulse 66, temperature 98.6 F (37 C), temperature source Oral, resp. rate 18, height 6' (1.829 m), weight 243 lb 3.2 oz (110.315 kg), SpO2 95.00%. Weight change:    3+ edema ankles to knees bilateral Moderate JVD while sitting in a chair Decreased breath sounds at both bases Irregular rhythm. No murmur.  ASSESSMENT:  1. Acute on chronic diastolic heart failure, multifactorial, related to loss of atrial kick, and volume overload related to progressive kidney dysfunction. 2. Acute on chronic kidney failure, probably precipitated by decreased cardiac output from  prolonged atrial fibrillation versus other issues. 3. Chronic anticoagulation therapy 4. Persistent atrial fibrillation despite one month loading with amiodarone.  Plan:  1. Electrical cardioversion will be performed 2. Renal consult with Dr. Marisue Humble 3. After successful cardioversion would like to keep in-house and diuresed aggressively to resolve heart failure but we will need to be careful not to further injure his renal function.  Selinda Eon 11/01/2013, 12:32 PM

## 2013-11-01 NOTE — Progress Notes (Signed)
Pt. Alert and oriented this am. No s/s of distress or discomfort noted. Pt. Denies pain. Pts. Prep for cardioversion completed. Pt. Currently resting in bed quietly. Family at bedside. Call light within reach. RN will continue to monitor pt. For changes in condition. Mckenlee Mangham, Cheryll Dessert

## 2013-11-02 DIAGNOSIS — N184 Chronic kidney disease, stage 4 (severe): Secondary | ICD-10-CM

## 2013-11-02 DIAGNOSIS — Z7901 Long term (current) use of anticoagulants: Secondary | ICD-10-CM

## 2013-11-02 DIAGNOSIS — I5033 Acute on chronic diastolic (congestive) heart failure: Secondary | ICD-10-CM

## 2013-11-02 LAB — BASIC METABOLIC PANEL
BUN: 77 mg/dL — ABNORMAL HIGH (ref 6–23)
CO2: 24 mEq/L (ref 19–32)
Calcium: 9.3 mg/dL (ref 8.4–10.5)
Creatinine, Ser: 4.35 mg/dL — ABNORMAL HIGH (ref 0.50–1.35)
Glucose, Bld: 85 mg/dL (ref 70–99)

## 2013-11-02 LAB — URINALYSIS, ROUTINE W REFLEX MICROSCOPIC
Bilirubin Urine: NEGATIVE
Leukocytes, UA: NEGATIVE
Nitrite: NEGATIVE
Protein, ur: 30 mg/dL — AB
Specific Gravity, Urine: 1.01 (ref 1.005–1.030)
Urobilinogen, UA: 0.2 mg/dL (ref 0.0–1.0)
pH: 5.5 (ref 5.0–8.0)

## 2013-11-02 LAB — URINE MICROSCOPIC-ADD ON

## 2013-11-02 MED ORDER — FUROSEMIDE 10 MG/ML IJ SOLN
80.0000 mg | Freq: Three times a day (TID) | INTRAMUSCULAR | Status: DC
  Administered 2013-11-02 – 2013-11-03 (×6): 80 mg via INTRAVENOUS
  Filled 2013-11-02 (×7): qty 8

## 2013-11-02 NOTE — Progress Notes (Signed)
Admit: 10/31/2013 LOS: 2  16M with AoCKD in setting of recent onset AFib (cardioverted 12/12), worsened edema, and likely some component of heart failure.  Subjective:  Feels better this AM Breathing better, ambulating, hungry Thinks edema improving  Renal US reviewed;  Multiple cysts and enlarged kidneys.   12/12 0701 - 12/13 0700 In: 1012 [P.O.:900; I.V.:50; IV Piggyback:62] Out: 750 [Urine:750]  Filed Weights   10/31/13 1917 11/01/13 0700 11/02/13 0500  Weight: 110.7 kg (244 lb 0.8 oz) 110.315 kg (243 lb 3.2 oz) 107.865 kg (237 lb 12.8 oz)    Current meds: reviewed  Current Labs: reviewed    Physical Exam:  Blood pressure 141/69, pulse 72, temperature 98.7 F (37.1 C), temperature source Oral, resp. rate 20, height 6' (1.829 m), weight 107.865 kg (237 lb 12.8 oz), SpO2 96.00%. GEN: NAD  ENT: NCAT. Good dentition  EYES: EOMI, sclera clear  CV: RRR, nl s1s2, no rub  PULM: CTAB but diminishedi n bases, no crackles  ABD: s/nt. Quiet BS  SKIN: no rashes notes; some chronic venous stasis changes at shins  EXT: 3+ pitting LEE in legs, thighs   Assessment/Plan 1. AoCKD: Stable GFR in past 24h.  Weight down.  I'm not sure we're measuring all urine right now.  Labs stable.  Inc lasix to TID and cont course. 2gm na restriction. Pt clinically improved.  Renal US reviewed.   2. Edema: as above,  Inc lasix frequency 3. AFib: in NSR s/p cardioversion  Sabra Heck MD 11/02/2013, 8:49 AM   Recent Labs Lab 10/31/13 2040 11/01/13 0554 11/02/13 0455  NA 134* 137 141  K 4.3 4.2 4.2  CL 100 103 108  CO2 23 21 24   GLUCOSE 130* 95 85  BUN 72* 73* 77*  CREATININE 4.32* 4.28* 4.35*  CALCIUM 9.5 9.6 9.3   No results found for this basename: WBC, NEUTROABS, HGB, HCT, MCV, PLT,  in the last 168 hours

## 2013-11-02 NOTE — Progress Notes (Signed)
Subjective:  Cardioversion performed on 12/12. Maintaining sinus rhythm. Overall doing well. No significant shortness of breath this morning.  Objective:  Vital Signs in the last 24 hours: Temp:  [98 F (36.7 C)-98.7 F (37.1 C)] 98.7 F (37.1 C) (12/13 0500) Pulse Rate:  [66-78] 72 (12/13 0500) Resp:  [17-20] 20 (12/13 0500) BP: (127-141)/(60-69) 141/69 mmHg (12/13 0500) SpO2:  [95 %-96 %] 96 % (12/13 0500) Weight:  [237 lb 12.8 oz (107.865 kg)] 237 lb 12.8 oz (107.865 kg) (12/13 0500)  Intake/Output from previous day: 12/12 0701 - 12/13 0700 In: 1012 [P.O.:900; I.V.:50; IV Piggyback:62] Out: 750 [Urine:750]  . amiodarone  200 mg Oral BID  . amLODipine  5 mg Oral BID  . apixaban  2.5 mg Oral BID  . atenolol  25 mg Oral Daily  . furosemide  80 mg Intravenous TID  . multivitamin with minerals  1 tablet Oral Daily  . omega-3 acid ethyl esters  2 g Oral Daily  . pantoprazole  40 mg Oral Daily  . sodium chloride  3 mL Intravenous Q12H  . sodium chloride  3 mL Intravenous Q12H  . sodium chloride  3 mL Intravenous Q12H    Physical Exam: General: Well developed, well nourished, in no acute distress. Head:  Normocephalic and atraumatic. Lungs: Clear to auscultation and percussion. Heart: Normal S1 and S2.  No murmur, rubs or gallops.  Abdomen: soft, non-tender, positive bowel sounds. Extremities: No clubbing or cyanosis. 2+ lower extremity edema. Neurologic: Alert and oriented x 3.    Lab Results: No results found for this basename: WBC, HGB, PLT,  in the last 72 hours  Recent Labs  11/01/13 0554 11/02/13 0455  NA 137 141  K 4.2 4.2  CL 103 108  CO2 21 24  GLUCOSE 95 85  BUN 73* 77*  CREATININE 4.28* 4.35*   No results found for this basename: TROPONINI, CK, MB,  in the last 72 hours Hepatic Function Panel  Recent Labs  10/31/13 2040  PROT 7.3  ALBUMIN 2.9*  AST 24  ALT 27  ALKPHOS 86  BILITOT 0.5    Imaging: US Renal  11/01/2013   CLINICAL  DATA:  Acute renal injury  EXAM: RENAL/URINARY TRACT ULTRASOUND COMPLETE  COMPARISON:  12/15/2003  FINDINGS: Right Kidney:  Length: 14.4 cm. Mild increased echogenicity is noted. Multiple cystic lesions are again identified within the right kidney. The largest of these lies in the upper pole 1 is mildly complex measuring 8.4 cm a more regular appearing cyst is noted in the midportion of the right kidney measuring 7.6 cm. An additional upper pole lesion which is stable from the prior exam measures 7.6 cm. No hydronephrosis is noted.  Left Kidney:  Length: 18 cm. Exophytic cyst is noted arising from the lower portion of the left kidney measuring 1.9 cm. Left-sided hydronephrosis is noted.  Bladder:  Appears normal for degree of bladder distention.  IMPRESSION: Multiple cystic lesions which appear to have increased in size when compared with the prior exam. Given the increase in size when compare with the prior exam a followup MRI may be helpful for further evaluation.  Persistent left-sided hydronephrosis.   Electronically Signed   By: Alcide Clever M.D.   On: 11/01/2013 18:59      Telemetry: atrial fibrillation now resolved to sinus rhythm. First degree AV block noted.Personally viewed.   Assessment/Plan:   1. Atrial fibrillation-status post cardioversion 11/01/13. Doing well. First degree AV block noted. We will continue  with amiodarone, atenolol.  2. Chronic anticoagulation-continue with apixiban. No evidence of bleeding.  3. Acute diastolic heart failure-now that he is in sinus rhythm, hopefully atrial kick/synchrony will help with overall diuresis. His Lasix has been increased to 3 times a day IV. Appreciate Dr. Lily Lovings help with nephrology.  4. Acute renal failure-BUN 77/creatinine 4.35 today. Hopefully with improved synchrony, diuresis, this will improve.    Trevor Burch 11/02/2013, 8:52 AM

## 2013-11-03 LAB — BASIC METABOLIC PANEL
BUN: 78 mg/dL — ABNORMAL HIGH (ref 6–23)
CO2: 26 mEq/L (ref 19–32)
Chloride: 103 mEq/L (ref 96–112)
GFR calc non Af Amer: 12 mL/min — ABNORMAL LOW (ref >90)
Glucose, Bld: 97 mg/dL (ref 70–99)
Potassium: 4 mEq/L (ref 3.5–5.1)
Sodium: 139 mEq/L (ref 135–145)

## 2013-11-03 NOTE — Progress Notes (Signed)
Subjective:  Cardioversion performed on 12/12. Maintaining sinus rhythm. Overall doing well. No significant shortness of breath this morning.  Objective:  Vital Signs in the last 24 hours: Temp:  [97.6 F (36.4 C)-98.5 F (36.9 C)] 97.6 F (36.4 C) (12/14 0625) Pulse Rate:  [62-65] 65 (12/14 0625) Resp:  [18] 18 (12/14 0625) BP: (120-142)/(56-68) 120/56 mmHg (12/14 0625) SpO2:  [93 %-98 %] 98 % (12/14 0625) Weight:  [232 lb 14.4 oz (105.643 kg)] 232 lb 14.4 oz (105.643 kg) (12/14 0625)  Intake/Output from previous day: 12/13 0701 - 12/14 0700 In: 720 [P.O.:720] Out: 2200 [Urine:2200]  . amiodarone  200 mg Oral BID  . amLODipine  5 mg Oral BID  . apixaban  2.5 mg Oral BID  . atenolol  25 mg Oral Daily  . furosemide  80 mg Intravenous TID  . multivitamin with minerals  1 tablet Oral Daily  . omega-3 acid ethyl esters  2 g Oral Daily  . pantoprazole  40 mg Oral Daily  . sodium chloride  3 mL Intravenous Q12H  . sodium chloride  3 mL Intravenous Q12H  . sodium chloride  3 mL Intravenous Q12H    Physical Exam: General: Well developed, well nourished, in no acute distress. Head:  Normocephalic and atraumatic. Lungs: Clear to auscultation and percussion. Heart: Normal S1 and S2.  No murmur, rubs or gallops.  Abdomen: soft, non-tender, positive bowel sounds. Extremities: No clubbing or cyanosis. 2+ lower extremity edema. Neurologic: Alert and oriented x 3.    Lab Results: No results found for this basename: WBC, HGB, PLT,  in the last 72 hours  Recent Labs  11/02/13 0455 11/03/13 0545  NA 141 139  K 4.2 4.0  CL 108 103  CO2 24 26  GLUCOSE 85 97  BUN 77* 78*  CREATININE 4.35* 4.33*    Hepatic Function Panel  Recent Labs  10/31/13 2040  PROT 7.3  ALBUMIN 2.9*  AST 24  ALT 27  ALKPHOS 86  BILITOT 0.5    Imaging: US Renal  11/01/2013   CLINICAL DATA:  Acute renal injury  EXAM: RENAL/URINARY TRACT ULTRASOUND COMPLETE  COMPARISON:  12/15/2003   FINDINGS: Right Kidney:  Length: 14.4 cm. Mild increased echogenicity is noted. Multiple cystic lesions are again identified within the right kidney. The largest of these lies in the upper pole 1 is mildly complex measuring 8.4 cm a more regular appearing cyst is noted in the midportion of the right kidney measuring 7.6 cm. An additional upper pole lesion which is stable from the prior exam measures 7.6 cm. No hydronephrosis is noted.  Left Kidney:  Length: 18 cm. Exophytic cyst is noted arising from the lower portion of the left kidney measuring 1.9 cm. Left-sided hydronephrosis is noted.  Bladder:  Appears normal for degree of bladder distention.  IMPRESSION: Multiple cystic lesions which appear to have increased in size when compared with the prior exam. Given the increase in size when compare with the prior exam a followup MRI may be helpful for further evaluation.  Persistent left-sided hydronephrosis.   Electronically Signed   By: Alcide Clever M.D.   On: 11/01/2013 18:59      Telemetry: atrial fibrillation now resolved to sinus rhythm. First degree AV block noted.Personally viewed.   Assessment/Plan:   1. Atrial fibrillation-status post cardioversion 11/01/13. Doing well. First degree AV block noted. We will continue with amiodarone, atenolol.  2. Chronic anticoagulation-continue with apixiban. No evidence of bleeding.  3. Acute  diastolic heart failure-now that he is in sinus rhythm, hopefully atrial kick/synchrony will help with overall diuresis. His Lasix has been increased to 3 times a day IV. Appreciate Dr. Lily Lovings help with nephrology. They may switch to PO tomorrow in anticipation of DC. He discussed dialysis. Exercise in swimming pool would be great.   4. Acute renal failure-BUN 77/creatinine 4.35 today. Hopefully with improved synchrony/ diuresis, this will improve. Dialysis may nonetheless be inevitable.     Trevor Burch 11/03/2013, 10:07 AM

## 2013-11-03 NOTE — Progress Notes (Signed)
Admit: 10/31/2013 LOS: 3  18M with AoCKD in setting of recent onset AFib (cardioverted 12/12), worsened edema, and likely some component of heart failure.  Subjective:  Feels well, eating well GFR stable Weight down 12lb Renal US reviewed;  Multiple cysts and enlarged kidneys.   12/13 0701 - 12/14 0700 In: 720 [P.O.:720] Out: 2200 [Urine:2200]  Filed Weights   11/01/13 0700 11/02/13 0500 11/03/13 0625  Weight: 110.315 kg (243 lb 3.2 oz) 107.865 kg (237 lb 12.8 oz) 105.643 kg (232 lb 14.4 oz)    Current meds: reviewed  Current Labs: reviewed    Physical Exam:  Blood pressure 120/56, pulse 65, temperature 97.6 F (36.4 C), temperature source Oral, resp. rate 18, height 6' (1.829 m), weight 105.643 kg (232 lb 14.4 oz), SpO2 98.00%. GEN: NAD  ENT: NCAT. Good dentition  EYES: EOMI, sclera clear  CV: RRR, nl s1s2, no rub  PULM: CTAB  ABD: s/nt, nd SKIN: no rashes notes; some chronic venous stasis changes at shins  EXT: 3+ pitting LEE in legs, thighs   Assessment/Plan 1. AoCKD: Labs stable.  Cont TID Lasix IV and if further improved in AM consider transition to PO regimen in  anticipation of discharge. Discussed home weights and BP. 2gm Na restriction.  Renal US reviewed.  Briefly discussed dialysis planning, which will further be addressed as outpt if needed; I think he would be a good candidate for home therapy. 2.  Edema: as above,  Cont lasix.  Improving but legs still quite tense.  Encouraged elevation of legs.  Discussed rationale of being in swimming pool for water pressure; if cardiology thinks he could be medically safe. 3.  AFib: in NSR s/p cardioversion  Sabra Heck MD 11/03/2013, 9:24 AM   Recent Labs Lab 11/01/13 0554 11/02/13 0455 11/03/13 0545  NA 137 141 139  K 4.2 4.2 4.0  CL 103 108 103  CO2 21 24 26   GLUCOSE 95 85 97  BUN 73* 77* 78*  CREATININE 4.28* 4.35* 4.33*  CALCIUM 9.6 9.3 9.7   No results found for this basename: WBC, NEUTROABS, HGB,  HCT, MCV, PLT,  in the last 168 hours

## 2013-11-04 ENCOUNTER — Other Ambulatory Visit: Payer: Self-pay | Admitting: Interventional Cardiology

## 2013-11-04 DIAGNOSIS — I10 Essential (primary) hypertension: Secondary | ICD-10-CM

## 2013-11-04 DIAGNOSIS — N179 Acute kidney failure, unspecified: Secondary | ICD-10-CM

## 2013-11-04 DIAGNOSIS — I5032 Chronic diastolic (congestive) heart failure: Secondary | ICD-10-CM

## 2013-11-04 DIAGNOSIS — N189 Chronic kidney disease, unspecified: Secondary | ICD-10-CM

## 2013-11-04 MED ORDER — FUROSEMIDE 80 MG PO TABS: 120.0000 mg | ORAL_TABLET | Freq: Two times a day (BID) | ORAL | Status: DC

## 2013-11-04 MED ORDER — FUROSEMIDE 80 MG PO TABS
120.0000 mg | ORAL_TABLET | Freq: Every day | ORAL | Status: DC
  Administered 2013-11-04: 11:00:00 120 mg via ORAL
  Filled 2013-11-04: qty 1

## 2013-11-04 MED ORDER — AMIODARONE HCL 200 MG PO TABS: 200.0000 mg | ORAL_TABLET | Freq: Every day | ORAL | Status: DC

## 2013-11-04 NOTE — Discharge Summary (Addendum)
Patient ID: Trevor Burch MRN: [id number] DOB/AGE: Jul 08, 1931 79 y.o.  Admit date: 10/31/2013 Discharge date: 11/04/2013  Primary Discharge Diagnosis: Atrial fibrillation, successful electrical cardioversion Secondary Discharge Diagnosis: Acute on chronic diastolic heart failure Acute on chronic kidney failure Hypertension Chronic anticoagulation  Significant Diagnostic Studies: Renal ultrasound, 11/01/13  Consults: Nephrology, Dr. Fatima Blank Course: The patient was admitted to the hospital with decompensated diastolic heart failure. The heart failure was felt secondary to multiple factors including the recent development of atrial fibrillation, progressive renal impairment with volume overload, and intrinsic hypertensive heart disease. Despite aggressive oral diuretic dosing, fluid retention or shortness of breath progressed. On the day following admission he underwent electrical cardioversion successfully. He was seen in consultation by nephrology. IV diuretic therapy was given to achieve a moderate diuresis with a total of 4.2 L negative over 48 hours. On the day of discharge the patient was ambulating without dyspnea. We had a discussion about the diagnosis of "heart failure," explained to her that he had the diastolic variety which is not as ominous as conventional systolic heart failure. I am concerned about his kidney function and the nephrology service is following closely. They have spoken to him about the possibility of dialysis at some point.   Discharge Exam: Blood pressure 122/62, pulse 54, temperature 98.4 F (36.9 C), temperature source Oral, resp. rate 18, height 6' (1.829 m), weight 212 lb 15.4 oz (96.6 kg), SpO2 98.00%.    1-2+ bilateral lower extremity edema Clear lung fields No rub or murmur. An S4 gallop is audible. The neurological exam is unremarkable.  Labs:   Lab Results  Component Value Date   WBC 6.9 10/02/2013   HGB 12.8* 10/02/2013   HCT  37.5* 10/02/2013   MCV 99.5 10/02/2013   PLT 177 10/02/2013    Recent Labs Lab 10/31/13 2040  11/03/13 0545  NA 134*  < > 139  K 4.3  < > 4.0  CL 100  < > 103  CO2 23  < > 26  BUN 72*  < > 78*  CREATININE 4.32*  < > 4.33*  CALCIUM 9.5  < > 9.7  PROT 7.3  --   --   BILITOT 0.5  --   --   ALKPHOS 86  --   --   ALT 27  --   --   AST 24  --   --   GLUCOSE 130*  < > 97  < > = values in this interval not displayed. Lab Results  Component Value Date   CKTOTAL 141 09/03/2007   CKMB 9.6* 09/03/2007   TROPONINI <0.30 10/01/2013    Lab Results  Component Value Date   CHOL  Value: 183        ATP III CLASSIFICATION:  <200     mg/dL   Desirable  409-811  mg/dL   Borderline High  >=914    mg/dL   High 78/29/5621   Lab Results  Component Value Date   HDL 34* 09/04/2007   Lab Results  Component Value Date   LDLCALC  Value: 130        Total Cholesterol/HDL:CHD Risk Coronary Heart Disease Risk Table                     Men   Women  1/2 Average Risk   3.4   3.3* 09/04/2007   Lab Results  Component Value Date   TRIG 95 09/04/2007   Lab  Results  Component Value Date   CHOLHDL 5.4 09/04/2007   No results found for this basename: LDLDIRECT      Radiology:  IMPRESSION:  Multiple cystic lesions which appear to have increased in size when  compared with the prior exam. Given the increase in size when  compare with the prior exam a followup MRI may be helpful for  further evaluation.  Persistent left-sided hydronephrosis.    EKG: Sinus bradycardia  FOLLOW UP PLANS AND APPOINTMENTS      Future Appointments Provider Department Dept Phone   03/03/2014 3:00 PM Lesleigh Noe, MD [facility] [phone]       Medication List         amiodarone 200 MG tablet  Commonly known as:  PACERONE  Take 1 tablet (200 mg total) by mouth daily.     amLODipine 5 MG tablet  Commonly known as:  NORVASC  Take 5 mg by mouth 2 (two) times daily.     atenolol 25 MG  tablet  Commonly known as:  TENORMIN  Take 25 mg by mouth daily.     Coenzyme Q10 50 MG Caps  Take 1 capsule by mouth daily.     diclofenac sodium 1 % Gel  Commonly known as:  VOLTAREN  Apply 2 g topically 3 (three) times daily as needed (inflammation/pain).     ELIQUIS 2.5 MG Tabs tablet  Generic drug:  apixaban  Take 2.5 mg by mouth 2 (two) times daily.     fish oil-omega-3 fatty acids 1000 MG capsule  Take 2 g by mouth daily.     furosemide 80 MG tablet  Commonly known as:  LASIX  Take 1.5 tablets (120 mg total) by mouth 2 (two) times daily.     multivitamin with minerals Tabs tablet  Take 1 tablet by mouth daily.     omeprazole-sodium bicarbonate 40-1100 MG per capsule  Commonly known as:  ZEGERID  Take 1 capsule by mouth daily before breakfast.       Follow-up Information   Follow up with Lesleigh Noe, MD On 11/11/2013. (BMET blood test as needed)    Specialty:  Cardiology   Contact information:   [address] [place] [address] [phone]       Follow up with Lesleigh Noe, MD In 3 weeks. (Call to arrange)    Specialty:  Cardiology   Contact information:   [address] [place] [address] [phone]       BRING ALL MEDICATIONS WITH YOU TO FOLLOW UP APPOINTMENTS  Time spent with patient to include physician time: 2 weeks BMET and one month clinical followup Signed: Lesleigh Noe 11/04/2013, 1:15 PM

## 2013-11-04 NOTE — Progress Notes (Signed)
Patient ID: Trevor Burch, male   DOB: 12-19-1930, 79 y.o.   MRN: [id number] S:feeling much better O:BP 122/62  Pulse 54  Temp(Src) 98.4 F (36.9 C) (Oral)  Resp 18  Ht 6' (1.829 m)  Wt 96.6 kg (212 lb 15.4 oz)  BMI 28.88 kg/m2  SpO2 98%  Intake/Output Summary (Last 24 hours) at 11/04/13 1414 Last data filed at 11/04/13 0947  Gross per 24 hour  Intake    723 ml  Output   2500 ml  Net  -1777 ml   Intake/Output: I/O last 3 completed shifts: In: 1083 [P.O.:1080; I.V.:3] Out: 4000 [Urine:4000]  Intake/Output this shift:  Total I/O In: 240 [P.O.:240] Out: -  Weight change: -9.043 kg (-19 lb 15 oz) Gen:WD WN WM who appears younger than stated age CVS:no rub Ext:1+ pitting edema   Recent Labs Lab 10/28/13 1418 10/31/13 1343 10/31/13 2040 11/01/13 0554 11/02/13 0455 11/03/13 0545  NA 137 136 134* 137 141 139  K 4.5 4.6 4.3 4.2 4.2 4.0  CL 103 102 100 103 108 103  CO2 25 26 23 21 24 26   GLUCOSE 104* 94 130* 95 85 97  BUN 75* 68* 72* 73* 77* 78*  CREATININE 4.4* 4.4* 4.32* 4.28* 4.35* 4.33*  ALBUMIN  --   --  2.9*  --   --   --   CALCIUM 9.5 9.4 9.5 9.6 9.3 9.7  AST  --   --  24  --   --   --   ALT  --   --  27  --   --   --    Liver Function Tests:  Recent Labs Lab 10/31/13 2040  AST 24  ALT 27  ALKPHOS 86  BILITOT 0.5  PROT 7.3  ALBUMIN 2.9*   No results found for this basename: LIPASE, AMYLASE,  in the last 168 hours No results found for this basename: AMMONIA,  in the last 168 hours CBC: No results found for this basename: WBC, NEUTROABS, HGB, HCT, MCV, PLT,  in the last 168 hours Cardiac Enzymes: No results found for this basename: CKTOTAL, CKMB, CKMBINDEX, TROPONINI,  in the last 168 hours CBG: No results found for this basename: GLUCAP,  in the last 168 hours  Iron Studies: No results found for this basename: IRON, TIBC, TRANSFERRIN, FERRITIN,  in the last 72 hours Studies/Results: No results found. Marland Kitchen amiodarone  200 mg Oral BID  . amLODipine   5 mg Oral BID  . apixaban  2.5 mg Oral BID  . atenolol  25 mg Oral Daily  . furosemide  120 mg Oral Daily  . multivitamin with minerals  1 tablet Oral Daily  . omega-3 acid ethyl esters  2 g Oral Daily  . pantoprazole  40 mg Oral Daily  . sodium chloride  3 mL Intravenous Q12H  . sodium chloride  3 mL Intravenous Q12H  . sodium chloride  3 mL Intravenous Q12H    BMET    Component Value Date/Time   NA 139 11/03/2013 0545   K 4.0 11/03/2013 0545   CL 103 11/03/2013 0545   CO2 26 11/03/2013 0545   GLUCOSE 97 11/03/2013 0545   BUN 78* 11/03/2013 0545   CREATININE 4.33* 11/03/2013 0545   CALCIUM 9.7 11/03/2013 0545   GFRNONAA 12* 11/03/2013 0545   GFRAA 14* 11/03/2013 0545   CBC    Component Value Date/Time   WBC 6.9 10/02/2013 0450   WBC 6.5 01/27/2009 0948   RBC 3.77*  10/02/2013 0450   HGB 12.8* 10/02/2013 0450   HGB 14.3 01/27/2009 0948   HCT 37.5* 10/02/2013 0450   HCT 43.5 01/27/2009 0948   PLT 177 10/02/2013 0450   PLT 239 01/27/2009 0948   MCV 99.5 10/02/2013 0450   MCV 100* 01/27/2009 0948   MCH 34.0 10/02/2013 0450   MCH 32.7 01/27/2009 0948   MCHC 34.1 10/02/2013 0450   MCHC 32.9 01/27/2009 0948   RDW 13.3 10/02/2013 0450   RDW 11.6 01/27/2009 0948   LYMPHSABS 1.2 09/30/2013 1700   LYMPHSABS 1.7 01/27/2009 0948   MONOABS 1.1* 09/30/2013 1700   EOSABS 0.1 09/30/2013 1700   EOSABS 0.2 01/27/2009 0948   BASOSABS 0.0 09/30/2013 1700   BASOSABS 0.1 01/27/2009 0948     Assessment/Plan:  1. AKI/CKD- related to new onset A fib and decompensated CHF.  Improving. Will f/u with Dr. Marisue Humble and have labs drawn Friday this week 2. A fib s/p cardioversion.  F/u with Dr. Anne Fu 3. CHD due to Afib- improving with diuretics 4. ACDz- stable 5. dispo- d/c to home today with f/u with Dr. Anne Fu and Marisue Humble.  Guilherme Schwenke A

## 2013-11-05 ENCOUNTER — Encounter (HOSPITAL_COMMUNITY): Payer: Self-pay | Admitting: Interventional Cardiology

## 2013-11-11 ENCOUNTER — Other Ambulatory Visit: Payer: BC Managed Care – PPO

## 2013-11-12 ENCOUNTER — Other Ambulatory Visit (INDEPENDENT_AMBULATORY_CARE_PROVIDER_SITE_OTHER): Payer: BC Managed Care – PPO

## 2013-11-12 ENCOUNTER — Telehealth: Payer: Self-pay | Admitting: Interventional Cardiology

## 2013-11-12 DIAGNOSIS — I5032 Chronic diastolic (congestive) heart failure: Secondary | ICD-10-CM

## 2013-11-12 LAB — BASIC METABOLIC PANEL
BUN: 65 mg/dL — ABNORMAL HIGH (ref 6–23)
Chloride: 103 mEq/L (ref 96–112)
Creatinine, Ser: 4.2 mg/dL — ABNORMAL HIGH (ref 0.4–1.5)
Glucose, Bld: 95 mg/dL (ref 70–99)
Potassium: 4.4 mEq/L (ref 3.5–5.1)
Sodium: 139 mEq/L (ref 135–145)

## 2013-11-12 NOTE — Telephone Encounter (Signed)
He thinks the Rx is Amiodarone

## 2013-11-12 NOTE — Telephone Encounter (Signed)
Received a call from lab and patient has a critical GFR 14.6 Will forward to Claudean Kinds  CMA and Dr Katrinka Blazing

## 2013-11-12 NOTE — Telephone Encounter (Signed)
[facility] and they need to hear from [place] before they fill the prescription.  Please call and approve the prescriptions.

## 2013-11-12 NOTE — Telephone Encounter (Signed)
Message copied by Jarvis Newcomer on Tue Nov 12, 2013  3:26 PM ------      Message from: Verdis Prime      Created: Tue Nov 12, 2013  3:16 PM       Kidney function is stable compared to one week ago. We should forward the results to Dr. Marisue Humble at [facility]. Tthe patient does not need to change his medical therapy in any way ------

## 2013-11-12 NOTE — Telephone Encounter (Signed)
pt given lab results for critical gfr.Kidney function is stable compared to one week ago. We should forward the results to Dr. Marisue Humble at [facility]. The patient does not need to change his medical therapy in any way.pt verbalized understanding.

## 2013-11-18 ENCOUNTER — Other Ambulatory Visit: Payer: Self-pay

## 2013-11-18 ENCOUNTER — Telehealth: Payer: Self-pay | Admitting: Interventional Cardiology

## 2013-11-18 MED ORDER — AMLODIPINE BESYLATE 5 MG PO TABS: 5.0000 mg | ORAL_TABLET | Freq: Two times a day (BID) | ORAL | Status: DC

## 2013-11-18 MED ORDER — AMIODARONE HCL 200 MG PO TABS: 200.0000 mg | ORAL_TABLET | Freq: Every day | ORAL | Status: DC

## 2013-11-18 NOTE — Telephone Encounter (Signed)
Message routed to refill pool.

## 2013-11-18 NOTE — Telephone Encounter (Signed)
New problem  Rep call to refill AMIODARONE 200 mg for 90 days call/ecrib/faxe it in please.    Ref# [id number]

## 2013-11-25 ENCOUNTER — Other Ambulatory Visit: Payer: Self-pay | Admitting: *Deleted

## 2013-11-25 MED ORDER — AMIODARONE HCL 200 MG PO TABS: 200.0000 mg | ORAL_TABLET | Freq: Every day | ORAL | Status: DC

## 2013-11-25 MED ORDER — AMLODIPINE BESYLATE 5 MG PO TABS: 5.0000 mg | ORAL_TABLET | Freq: Two times a day (BID) | ORAL | Status: DC

## 2013-11-28 ENCOUNTER — Encounter: Payer: Self-pay | Admitting: Physician Assistant

## 2013-11-28 ENCOUNTER — Telehealth: Payer: Self-pay | Admitting: *Deleted

## 2013-11-28 ENCOUNTER — Ambulatory Visit (INDEPENDENT_AMBULATORY_CARE_PROVIDER_SITE_OTHER): Payer: BC Managed Care – PPO | Admitting: Physician Assistant

## 2013-11-28 VITALS — BP 130/70 | HR 52 | Ht 72.0 in | Wt 239.0 lb

## 2013-11-28 DIAGNOSIS — R0602 Shortness of breath: Secondary | ICD-10-CM

## 2013-11-28 DIAGNOSIS — N184 Chronic kidney disease, stage 4 (severe): Secondary | ICD-10-CM

## 2013-11-28 DIAGNOSIS — I4891 Unspecified atrial fibrillation: Secondary | ICD-10-CM

## 2013-11-28 DIAGNOSIS — I5032 Chronic diastolic (congestive) heart failure: Secondary | ICD-10-CM

## 2013-11-28 DIAGNOSIS — I10 Essential (primary) hypertension: Secondary | ICD-10-CM

## 2013-11-28 LAB — CBC WITH DIFFERENTIAL/PLATELET
Basophils Absolute: 0 10*3/uL (ref 0.0–0.1)
Basophils Relative: 0.2 % (ref 0.0–3.0)
EOS ABS: 0.1 10*3/uL (ref 0.0–0.7)
Eosinophils Relative: 1.6 % (ref 0.0–5.0)
HCT: 37 % — ABNORMAL LOW (ref 39.0–52.0)
Hemoglobin: 12.6 g/dL — ABNORMAL LOW (ref 13.0–17.0)
LYMPHS PCT: 25.6 % (ref 12.0–46.0)
Lymphs Abs: 2.2 10*3/uL (ref 0.7–4.0)
MCHC: 34.2 g/dL (ref 30.0–36.0)
MCV: 97.4 fl (ref 78.0–100.0)
MONO ABS: 1 10*3/uL (ref 0.1–1.0)
Monocytes Relative: 12 % (ref 3.0–12.0)
Neutro Abs: 5.2 10*3/uL (ref 1.4–7.7)
Neutrophils Relative %: 60.6 % (ref 43.0–77.0)
Platelets: 197 10*3/uL (ref 150.0–400.0)
RBC: 3.8 Mil/uL — ABNORMAL LOW (ref 4.22–5.81)
RDW: 14.5 % (ref 11.5–14.6)
WBC: 8.6 10*3/uL (ref 4.5–10.5)

## 2013-11-28 LAB — RENAL FUNCTION PANEL
Albumin: 3.4 g/dL — ABNORMAL LOW (ref 3.5–5.2)
BUN: 99 mg/dL (ref 6–23)
CO2: 26 mEq/L (ref 19–32)
Calcium: 9.6 mg/dL (ref 8.4–10.5)
Chloride: 104 mEq/L (ref 96–112)
Creatinine, Ser: 4.8 mg/dL (ref 0.4–1.5)
GFR: 12.56 mL/min — CL (ref >60.00)
GLUCOSE: 86 mg/dL (ref 70–99)
PHOSPHORUS: 5.3 mg/dL — AB (ref 2.3–4.6)
POTASSIUM: 4.8 meq/L (ref 3.5–5.1)
Sodium: 139 mEq/L (ref 135–145)

## 2013-11-28 LAB — BRAIN NATRIURETIC PEPTIDE: Pro B Natriuretic peptide (BNP): 220 pg/mL — ABNORMAL HIGH (ref 0.0–100.0)

## 2013-11-28 NOTE — Progress Notes (Signed)
[address] [place], [place]  [id number] Phone: [phone] Fax:  [phone]  Date:  11/28/2013   ID:  Trevor ArtDonald M Tomasso, DOB 07/29/1931, MRN [id number]  PCP:  Darrow BussingKOIRALA,DIBAS, MD  Cardiologist:  Dr. Verdis PrimeHenry Burch   History of Present Illness: Trevor Burch is a 80 y.o. male with a history of nonobstructive CAD by cardiac catheterization 2007, HTN, CKD, HL. Myoview (02/2006): No ischemia.  Patient was recently diagnosed with atrial fibrillation with RVR in 09/2013.  Echo (09/2013): Moderate concentric LVH, normal LV function, mild aortic stenosis (mean gradient 15 mm of mercury), mild AI, mild MR, mild LAE.  He was placed on amiodarone and Eliquis. He was readmitted 12/11-12/15 with acute on chronic diastolic CHF.  He had worsening creatinine (peak 4.4). He was seen by nephrology. He was diuresed with IV Lasix.  He underwent cardioversion with restoration of NSR.  He has been doing well since d/c.  He denies palps.  He notes his dyspnea is improved.  He is NYHA Class 2.  He denies orthopnea, PND, syncope.  He denies chest pain.  His LE edema is stable.  It is better in the AM and increased in the PM.    Recent Labs: 10/02/2013: Hemoglobin 12.8*  10/31/2013: ALT 27; Pro B Natriuretic peptide (BNP) 7072.0*; TSH 1.835  11/12/2013: Creatinine 4.2*; Potassium 4.4   Wt Readings from Last 3 Encounters:  11/28/13 239 lb (108.41 kg)  11/04/13 212 lb 15.4 oz (96.6 kg)  11/04/13 212 lb 15.4 oz (96.6 kg)     Past Medical History  Diagnosis Date  . Kidney function abnormal     has approx. 20% function - stablized x several years  . GERD (gastroesophageal reflux disease)   . Hypertension   . Atrial fibrillation   . Community acquired pneumonia   . Hypercholesteremia   . Allergic rhinitis   . Chronic kidney disease     RENAL INSUFFICENCY   . Arthritis     BILATERAL KNEES  . CHF (congestive heart failure)   . Hepatitis     HX OF HEP B    Current Outpatient Prescriptions    Medication Sig Dispense Refill  . amiodarone (PACERONE) 200 MG tablet Take 1 tablet (200 mg total) by mouth daily.  90 tablet  3  . amLODipine (NORVASC) 5 MG tablet Take 1 tablet (5 mg total) by mouth 2 (two) times daily.  180 tablet  1  . apixaban (ELIQUIS) 2.5 MG TABS tablet Take 2.5 mg by mouth 2 (two) times daily.      Marland Kitchen. atenolol (TENORMIN) 25 MG tablet Take 25 mg by mouth daily.      . Coenzyme Q10 50 MG CAPS Take 1 capsule by mouth daily.      . diclofenac sodium (VOLTAREN) 1 % GEL Apply 2 g topically 3 (three) times daily as needed (inflammation/pain).      . fish oil-omega-3 fatty acids 1000 MG capsule Take 2 g by mouth daily.      . furosemide (LASIX) 80 MG tablet Take 1.5 tablets (120 mg total) by mouth 2 (two) times daily.  90 tablet  11  . Multiple Vitamin (MULTIVITAMIN WITH MINERALS) TABS tablet Take 1 tablet by mouth daily.      Marland Kitchen. omeprazole-sodium bicarbonate (ZEGERID) 40-1100 MG per capsule Take 1 capsule by mouth daily before breakfast.        No current facility-administered medications for this visit.    Allergies:   Review of patient's  allergies indicates no known allergies.   Social History:  The patient  reports that he has never smoked. He has never used smokeless tobacco. He reports that he drinks alcohol. He reports that he does not use illicit drugs.   Family History:  The patient's family history includes Heart disease in his father.   ROS:  Please see the history of present illness.      All other systems reviewed and negative.   PHYSICAL EXAM: VS:  BP 130/70  Pulse 52  Ht 6' (1.829 m)  Wt 239 lb (108.41 kg)  BMI 32.41 kg/m2 Well nourished, well developed, in no acute distress HEENT: normal Neck: min elevated JVD Cardiac:  normal S1, S2; RRR; 2/6 systolic murmur @ RUSB Lungs:  clear to auscultation bilaterally, no wheezing, rhonchi or rales Abd: soft, nontender, no hepatomegaly Ext: 1-2+ bilat LE edema Skin: warm and dry Neuro:  CNs 2-12 intact, no  focal abnormalities noted  EKG:  Sinus brady, HR 52, NSSTTW changes, QTc 455     ASSESSMENT AND PLAN:  1. Atrial Fibrillation:  Maintaining NSR.  Continue Eliquis and Amiodarone.  Recent TSH and LFTs were ok.  Check F/u CBC. 2. Chronic Diastolic CHF:  He still has some evidence of volume overload.  However, it is much improved.  This is exacerbated by CKD.  Weights have been stable.  Symptoms are improved.  Continue current Rx.  Check BMET and BNP today.  If BNP is elevated, consider increasing lasix.   3. Chronic Kidney Disease:  Check BMET today.  Continue follow up with Dr. Marisue Humble.   4. Hypertension:  Controlled.   5. Disposition:  F/u with Dr. Verdis Prime in 6-8 weeks.  Signed, Tereso Newcomer, PA-C  11/28/2013 11:56 AM

## 2013-11-28 NOTE — Telephone Encounter (Signed)
lmptcb for lab results and med change 

## 2013-11-28 NOTE — Patient Instructions (Signed)
Your physician recommends that you continue on your current medications as directed. Please refer to the Current Medication list given to you today.  LAB WORK TODAY; BNP, CBC W/DIFF, RENAL PANEL  YOU HAVE A FOLLOW UP WITH DR. Katrinka BlazingSMITH ON 01/21/14 @ 1:30

## 2013-11-28 NOTE — Telephone Encounter (Signed)
[facility] called with Critical Lab Value from this morning 11/28/13.  BUN = 99, Cr = 4.8 and GFR = 12.56.  Hand delivered to [facility]/Scott Weaver, PA, at 2:55 pm.

## 2013-11-29 ENCOUNTER — Telehealth: Payer: Self-pay | Admitting: *Deleted

## 2013-11-29 DIAGNOSIS — I10 Essential (primary) hypertension: Secondary | ICD-10-CM

## 2013-11-29 DIAGNOSIS — I5032 Chronic diastolic (congestive) heart failure: Secondary | ICD-10-CM

## 2013-11-29 DIAGNOSIS — I4891 Unspecified atrial fibrillation: Secondary | ICD-10-CM

## 2013-11-29 DIAGNOSIS — I5033 Acute on chronic diastolic (congestive) heart failure: Secondary | ICD-10-CM

## 2013-11-29 DIAGNOSIS — N184 Chronic kidney disease, stage 4 (severe): Secondary | ICD-10-CM

## 2013-11-29 MED ORDER — FUROSEMIDE 80 MG PO TABS: ORAL_TABLET | ORAL | Status: DC

## 2013-11-29 NOTE — Telephone Encounter (Signed)
now returning ptcb, however had to lmptcb again.

## 2013-11-29 NOTE — Telephone Encounter (Signed)
Follow up     Returning a nurses call to get results

## 2013-11-29 NOTE — Telephone Encounter (Signed)
lmptcb x 3 on both home and cell #.

## 2013-12-06 ENCOUNTER — Telehealth: Payer: Self-pay | Admitting: *Deleted

## 2013-12-06 ENCOUNTER — Other Ambulatory Visit (INDEPENDENT_AMBULATORY_CARE_PROVIDER_SITE_OTHER): Payer: BC Managed Care – PPO

## 2013-12-06 DIAGNOSIS — I4891 Unspecified atrial fibrillation: Secondary | ICD-10-CM

## 2013-12-06 DIAGNOSIS — N289 Disorder of kidney and ureter, unspecified: Secondary | ICD-10-CM

## 2013-12-06 DIAGNOSIS — I5032 Chronic diastolic (congestive) heart failure: Secondary | ICD-10-CM

## 2013-12-06 DIAGNOSIS — N184 Chronic kidney disease, stage 4 (severe): Secondary | ICD-10-CM

## 2013-12-06 DIAGNOSIS — I10 Essential (primary) hypertension: Secondary | ICD-10-CM

## 2013-12-06 DIAGNOSIS — I5033 Acute on chronic diastolic (congestive) heart failure: Secondary | ICD-10-CM

## 2013-12-06 LAB — BASIC METABOLIC PANEL
BUN: 87 mg/dL (ref 6–23)
CO2: 24 mEq/L (ref 19–32)
Calcium: 9.6 mg/dL (ref 8.4–10.5)
Chloride: 104 mEq/L (ref 96–112)
Creatinine, Ser: 5 mg/dL (ref 0.4–1.5)
GFR: 11.93 mL/min — AB (ref >60.00)
Glucose, Bld: 105 mg/dL — ABNORMAL HIGH (ref 70–99)
POTASSIUM: 4.8 meq/L (ref 3.5–5.1)
SODIUM: 136 meq/L (ref 135–145)

## 2013-12-06 NOTE — Telephone Encounter (Signed)
Pt left a "memo" for dr for [employer]. Pt would like to find out about it. Please call them.

## 2013-12-06 NOTE — Telephone Encounter (Signed)
Discussed with Dr Jens Somrenshaw and he recommended patient change his Atenolol to Toprol 25 mg daily. Also if patient is not having any swelling, shortness of breath, or CHF symptoms could decrease his Lasix to 80 mg daily. Tried to call the patient at home and cell number listed, left messages to call back.

## 2013-12-09 MED ORDER — METOPROLOL SUCCINATE ER 25 MG PO TB24: 25.0000 mg | ORAL_TABLET | Freq: Every day | ORAL | Status: DC

## 2013-12-09 NOTE — Telephone Encounter (Signed)
Advised patient of lab results, verbalized understanding.  Patient not having any swelling/shortness of breath, will decrease his Lasix to 80 mg twice a day.  New Rx sent to [facility] and scheduled follow up labs

## 2013-12-09 NOTE — Telephone Encounter (Signed)
Follow up     Wife calling need to discuss form that was left in dec.

## 2013-12-09 NOTE — Telephone Encounter (Signed)
Message copied by Burnell BlanksPRATT, Pernell Dikes B on Mon Dec 09, 2013  4:45 PM ------      Message from: BowmanWEAVER, [place] T      Created: Mon Dec 09, 2013 10:47 AM       Agree with plan as outlined by Dr. Olga MillersBrian Crenshaw.      Please fax labs to Nephrologist ASAP.      Creatinine is worse.       We may need to switch him from Eliquis to coumadin with poor renal function.      Repeat BMET in 2 days.      Tereso NewcomerScott Weaver, PA-C        12/09/2013 10:47 AM ------

## 2013-12-09 NOTE — Telephone Encounter (Signed)
Labs sent to Dr Marisue HumbleSanford, nephrologist

## 2013-12-10 ENCOUNTER — Encounter: Payer: Self-pay | Admitting: Interventional Cardiology

## 2013-12-10 ENCOUNTER — Telehealth: Payer: Self-pay

## 2013-12-10 MED ORDER — WARFARIN SODIUM 2.5 MG PO TABS
2.5000 mg | ORAL_TABLET | Freq: Every day | ORAL | Status: DC
Start: 2013-12-10 — End: 2013-12-26

## 2013-12-10 NOTE — Telephone Encounter (Signed)
pt given Dr.Smith instructions to STOP Eliquis and start Coumadin 2.5mg  daily.appt made in coumadin clinic for 12/13/13 @3 :45.pt adv that Rx sent to pharmacy.pt is to d/c eliquis today and pick up and start coumadin today.pt verbalized understanding.

## 2013-12-10 NOTE — Telephone Encounter (Signed)
Message copied by Jarvis NewcomerPARRIS-GODLEY, Mare Ludtke S on Tue Dec 10, 2013  8:07 AM ------      Message from: Verdis PrimeSMITH, HENRY      Created: Tue Dec 10, 2013  7:24 AM       Please have patient stop Eliquis and start coumadin 2.5 mg daily. Have appointment in coumadin clinic in 3 days after startng coumadin. Let him know that kidney function does not allow us to use Eliquis any more. ------

## 2013-12-11 ENCOUNTER — Other Ambulatory Visit: Payer: BC Managed Care – PPO

## 2013-12-11 ENCOUNTER — Telehealth: Payer: Self-pay

## 2013-12-11 NOTE — Telephone Encounter (Signed)
pt aware completed ins form mailed.

## 2013-12-13 ENCOUNTER — Ambulatory Visit (INDEPENDENT_AMBULATORY_CARE_PROVIDER_SITE_OTHER): Payer: BC Managed Care – PPO | Admitting: Pharmacist

## 2013-12-13 DIAGNOSIS — Z5181 Encounter for therapeutic drug level monitoring: Secondary | ICD-10-CM

## 2013-12-13 DIAGNOSIS — I4891 Unspecified atrial fibrillation: Secondary | ICD-10-CM

## 2013-12-13 LAB — POCT INR: INR: 1

## 2013-12-19 ENCOUNTER — Ambulatory Visit: Payer: BC Managed Care – PPO

## 2013-12-20 ENCOUNTER — Ambulatory Visit (INDEPENDENT_AMBULATORY_CARE_PROVIDER_SITE_OTHER): Payer: BC Managed Care – PPO | Admitting: Pharmacist

## 2013-12-20 DIAGNOSIS — I4891 Unspecified atrial fibrillation: Secondary | ICD-10-CM

## 2013-12-20 DIAGNOSIS — Z5181 Encounter for therapeutic drug level monitoring: Secondary | ICD-10-CM

## 2013-12-20 LAB — POCT INR: INR: 1.1

## 2013-12-26 ENCOUNTER — Ambulatory Visit (INDEPENDENT_AMBULATORY_CARE_PROVIDER_SITE_OTHER): Payer: BC Managed Care – PPO | Admitting: *Deleted

## 2013-12-26 DIAGNOSIS — Z5181 Encounter for therapeutic drug level monitoring: Secondary | ICD-10-CM

## 2013-12-26 DIAGNOSIS — I4891 Unspecified atrial fibrillation: Secondary | ICD-10-CM

## 2013-12-26 LAB — POCT INR: INR: 1.3

## 2013-12-26 MED ORDER — WARFARIN SODIUM 2.5 MG PO TABS: ORAL_TABLET | ORAL | Status: DC

## 2014-01-03 ENCOUNTER — Ambulatory Visit (INDEPENDENT_AMBULATORY_CARE_PROVIDER_SITE_OTHER): Payer: BC Managed Care – PPO | Admitting: *Deleted

## 2014-01-03 ENCOUNTER — Encounter: Payer: Self-pay | Admitting: Interventional Cardiology

## 2014-01-03 ENCOUNTER — Encounter (INDEPENDENT_AMBULATORY_CARE_PROVIDER_SITE_OTHER): Payer: BC Managed Care – PPO | Admitting: Ophthalmology

## 2014-01-03 DIAGNOSIS — I10 Essential (primary) hypertension: Secondary | ICD-10-CM

## 2014-01-03 DIAGNOSIS — H2100 Hyphema, unspecified eye: Secondary | ICD-10-CM

## 2014-01-03 DIAGNOSIS — I4891 Unspecified atrial fibrillation: Secondary | ICD-10-CM

## 2014-01-03 DIAGNOSIS — H35039 Hypertensive retinopathy, unspecified eye: Secondary | ICD-10-CM

## 2014-01-03 DIAGNOSIS — Z5181 Encounter for therapeutic drug level monitoring: Secondary | ICD-10-CM

## 2014-01-03 DIAGNOSIS — H43819 Vitreous degeneration, unspecified eye: Secondary | ICD-10-CM

## 2014-01-03 DIAGNOSIS — H4310 Vitreous hemorrhage, unspecified eye: Secondary | ICD-10-CM

## 2014-01-03 LAB — POCT INR: INR: 2.1

## 2014-01-08 ENCOUNTER — Encounter (INDEPENDENT_AMBULATORY_CARE_PROVIDER_SITE_OTHER): Payer: Medicare Other | Admitting: Ophthalmology

## 2014-01-08 ENCOUNTER — Encounter (INDEPENDENT_AMBULATORY_CARE_PROVIDER_SITE_OTHER): Payer: BC Managed Care – PPO | Admitting: Ophthalmology

## 2014-01-08 DIAGNOSIS — H4310 Vitreous hemorrhage, unspecified eye: Secondary | ICD-10-CM

## 2014-01-08 DIAGNOSIS — H43819 Vitreous degeneration, unspecified eye: Secondary | ICD-10-CM

## 2014-01-08 DIAGNOSIS — H2100 Hyphema, unspecified eye: Secondary | ICD-10-CM

## 2014-01-08 DIAGNOSIS — I10 Essential (primary) hypertension: Secondary | ICD-10-CM

## 2014-01-08 DIAGNOSIS — H35039 Hypertensive retinopathy, unspecified eye: Secondary | ICD-10-CM

## 2014-01-10 ENCOUNTER — Telehealth: Payer: Self-pay | Admitting: *Deleted

## 2014-01-10 ENCOUNTER — Telehealth: Payer: Self-pay | Admitting: Interventional Cardiology

## 2014-01-10 NOTE — Telephone Encounter (Signed)
New problem   Pt's sx has been postpone for 3wk and pt need to know if he need to stay off coumadin. Please advise.

## 2014-01-10 NOTE — Telephone Encounter (Signed)
Returned call to patient's wife she stated husband's eye surgery has been cancelled and will see eye Dr.in 2 weeks to see if eye surgery will be rescheduled.Advised to restart coumadin at his normal dose.Message will be sent to coumadin clinic for follow up INR.Wife will call back when she finds out surgery date.

## 2014-01-10 NOTE — Telephone Encounter (Signed)
This message has been addressed see telephone note in Epic 01/10/14.

## 2014-01-10 NOTE — Telephone Encounter (Signed)
Pt called stating that he has been holding coumadin took last dose on 01/06/2014  for Surgery on his eye was scheduled on 01/14/2014 and now cancelled to be rescheduled possibly on March 10th. Talked with Dr Jimmye NormanSmart and he states have pt take 3 tablets ( Coumadin 2.5mg  tablets)  today and tomorrow then resume tablets 2 every day and appt made to have INR rechecked on Friday 01/17/2014. This information given to pt's wife and she states understanding.

## 2014-01-12 ENCOUNTER — Emergency Department (HOSPITAL_COMMUNITY)
Admission: EM | Admit: 2014-01-12 | Discharge: 2014-01-12 | Disposition: A | Discharge status: Home / Self Care | Payer: Medicare Other | Attending: Emergency Medicine | Admitting: Emergency Medicine

## 2014-01-12 ENCOUNTER — Encounter (HOSPITAL_COMMUNITY): Payer: Self-pay | Admitting: Emergency Medicine

## 2014-01-12 ENCOUNTER — Emergency Department (HOSPITAL_COMMUNITY): Payer: Medicare Other

## 2014-01-12 DIAGNOSIS — Z043 Encounter for examination and observation following other accident: Secondary | ICD-10-CM | POA: Insufficient documentation

## 2014-01-12 DIAGNOSIS — Z8709 Personal history of other diseases of the respiratory system: Secondary | ICD-10-CM | POA: Insufficient documentation

## 2014-01-12 DIAGNOSIS — I4891 Unspecified atrial fibrillation: Secondary | ICD-10-CM | POA: Insufficient documentation

## 2014-01-12 DIAGNOSIS — N186 End stage renal disease: Secondary | ICD-10-CM | POA: Insufficient documentation

## 2014-01-12 DIAGNOSIS — I509 Heart failure, unspecified: Secondary | ICD-10-CM | POA: Insufficient documentation

## 2014-01-12 DIAGNOSIS — E7800 Pure hypercholesterolemia, unspecified: Secondary | ICD-10-CM | POA: Insufficient documentation

## 2014-01-12 DIAGNOSIS — K219 Gastro-esophageal reflux disease without esophagitis: Secondary | ICD-10-CM | POA: Insufficient documentation

## 2014-01-12 DIAGNOSIS — N390 Urinary tract infection, site not specified: Secondary | ICD-10-CM

## 2014-01-12 DIAGNOSIS — Z992 Dependence on renal dialysis: Secondary | ICD-10-CM | POA: Insufficient documentation

## 2014-01-12 DIAGNOSIS — I120 Hypertensive chronic kidney disease with stage 5 chronic kidney disease or end stage renal disease: Secondary | ICD-10-CM | POA: Insufficient documentation

## 2014-01-12 DIAGNOSIS — Z8701 Personal history of pneumonia (recurrent): Secondary | ICD-10-CM | POA: Insufficient documentation

## 2014-01-12 DIAGNOSIS — Z79899 Other long term (current) drug therapy: Secondary | ICD-10-CM | POA: Insufficient documentation

## 2014-01-12 DIAGNOSIS — F29 Unspecified psychosis not due to a substance or known physiological condition: Secondary | ICD-10-CM | POA: Insufficient documentation

## 2014-01-12 DIAGNOSIS — Y9389 Activity, other specified: Secondary | ICD-10-CM | POA: Insufficient documentation

## 2014-01-12 DIAGNOSIS — Z8619 Personal history of other infectious and parasitic diseases: Secondary | ICD-10-CM | POA: Insufficient documentation

## 2014-01-12 DIAGNOSIS — M129 Arthropathy, unspecified: Secondary | ICD-10-CM | POA: Insufficient documentation

## 2014-01-12 DIAGNOSIS — N189 Chronic kidney disease, unspecified: Secondary | ICD-10-CM

## 2014-01-12 DIAGNOSIS — W050XXA Fall from non-moving wheelchair, initial encounter: Secondary | ICD-10-CM | POA: Insufficient documentation

## 2014-01-12 DIAGNOSIS — Z7901 Long term (current) use of anticoagulants: Secondary | ICD-10-CM | POA: Insufficient documentation

## 2014-01-12 DIAGNOSIS — Z791 Long term (current) use of non-steroidal anti-inflammatories (NSAID): Secondary | ICD-10-CM | POA: Insufficient documentation

## 2014-01-12 DIAGNOSIS — Y929 Unspecified place or not applicable: Secondary | ICD-10-CM | POA: Insufficient documentation

## 2014-01-12 LAB — CBC
HCT: 35.3 % — ABNORMAL LOW (ref 39.0–52.0)
Hemoglobin: 12 g/dL — ABNORMAL LOW (ref 13.0–17.0)
MCH: 33.8 pg (ref 26.0–34.0)
MCHC: 34 g/dL (ref 30.0–36.0)
MCV: 99.4 fL (ref 78.0–100.0)
Platelets: 238 10*3/uL (ref 150–400)
RBC: 3.55 MIL/uL — ABNORMAL LOW (ref 4.22–5.81)
RDW: 14.7 % (ref 11.5–15.5)
WBC: 8.5 10*3/uL (ref 4.0–10.5)

## 2014-01-12 LAB — URINALYSIS, ROUTINE W REFLEX MICROSCOPIC
Bilirubin Urine: NEGATIVE
GLUCOSE, UA: NEGATIVE mg/dL
KETONES UR: NEGATIVE mg/dL
Nitrite: POSITIVE — AB
PH: 6.5 (ref 5.0–8.0)
Protein, ur: 30 mg/dL — AB
Specific Gravity, Urine: 1.013 (ref 1.005–1.030)
Urobilinogen, UA: 0.2 mg/dL (ref 0.0–1.0)

## 2014-01-12 LAB — COMPREHENSIVE METABOLIC PANEL
ALK PHOS: 84 U/L (ref 39–117)
ALT: 20 U/L (ref 0–53)
AST: 20 U/L (ref 0–37)
Albumin: 3.2 g/dL — ABNORMAL LOW (ref 3.5–5.2)
BUN: 91 mg/dL — AB (ref 6–23)
CO2: 20 mEq/L (ref 19–32)
Calcium: 9.6 mg/dL (ref 8.4–10.5)
Chloride: 106 mEq/L (ref 96–112)
Creatinine, Ser: 4.22 mg/dL — ABNORMAL HIGH (ref 0.50–1.35)
GFR calc Af Amer: 14 mL/min — ABNORMAL LOW (ref >90)
GFR calc non Af Amer: 12 mL/min — ABNORMAL LOW (ref >90)
Glucose, Bld: 104 mg/dL — ABNORMAL HIGH (ref 70–99)
POTASSIUM: 3.8 meq/L (ref 3.7–5.3)
SODIUM: 140 meq/L (ref 137–147)
TOTAL PROTEIN: 7.5 g/dL (ref 6.0–8.3)
Total Bilirubin: 0.6 mg/dL (ref 0.3–1.2)

## 2014-01-12 LAB — URINE MICROSCOPIC-ADD ON

## 2014-01-12 LAB — PROTIME-INR
INR: 1.17 (ref 0.00–1.49)
PROTHROMBIN TIME: 14.7 s (ref 11.6–15.2)

## 2014-01-12 MED ORDER — CEPHALEXIN 500 MG PO CAPS: 500.0000 mg | ORAL_CAPSULE | Freq: Three times a day (TID) | ORAL | Status: DC

## 2014-01-12 MED ORDER — SODIUM CHLORIDE 0.9 % IV BOLUS (SEPSIS)
1000.0000 mL | Freq: Once | INTRAVENOUS | Status: AC
Start: 2014-01-12 — End: 2014-01-12
  Administered 2014-01-12: 1000 mL via INTRAVENOUS

## 2014-01-12 MED ORDER — DEXTROSE 5 % IV SOLN
1.0000 g | INTRAVENOUS | Status: DC
  Administered 2014-01-12: 1 g via INTRAVENOUS
  Filled 2014-01-12: qty 10

## 2014-01-12 NOTE — ED Notes (Signed)
Nurse attempted to cath per patient request. Meeting resistance, no urine. Patient attempted to self-cath with his own catheter from home with no results. Bladder scan showed 200cc in the bladder. Patient states he does not feel pressure or need to empty. Last self-cath at home was 6 hours ago he sates. Notified Dr. Denton LankSteinl.

## 2014-01-12 NOTE — ED Notes (Signed)
Patient returned from CT

## 2014-01-12 NOTE — ED Notes (Signed)
Wife present at bedside at this time; notified Dr. Denton LankSteinl.

## 2014-01-12 NOTE — ED Provider Notes (Addendum)
CSN: [id number]     Arrival date & time 01/12/14  1357 History   First MD Initiated Contact with Patient 01/12/14 1402     Chief Complaint  Patient presents with  . Fall     (Consider location/radiation/quality/duration/timing/severity/associated sxs/prior Treatment) Patient is a 80 y.o. male presenting with fall. The history is provided by the patient and the EMS personnel. The history is limited by the condition of the patient.  Fall Pertinent negatives include no chest pain, no abdominal pain, no headaches and no shortness of breath.  pt with hx afib, htn, CRI, presents from home. Per ems report, spouse noted generalized weakness, and increased confusion in the past 1-2 weeks. Today pt was trying to sit down into wheelchair and chair moved, he fell onto buttocks. Pt denies injury or loc. Pt states he was just sent to ER as a precaution due to fall. Pt states recent health at baseline. Pt denies pain. Denies headache. No neck or back pain. No cp. No sob. No cough or uri c/o. No fever or chills. No abd pain. No nvd. Normal appetite. No dysuria, states self caths at baseline. States at baseline is generally weak,and that in past couple weeks has had to start using walker and/or wheelchair to assist w mobility. Pt denies change in meds, new meds. States self caths per normal routine, normal urine output.    Past Medical History  Diagnosis Date  . Kidney function abnormal     has approx. 20% function - stablized x several years  . GERD (gastroesophageal reflux disease)   . Hypertension   . Atrial fibrillation   . Community acquired pneumonia   . Hypercholesteremia   . Allergic rhinitis   . Chronic kidney disease     RENAL INSUFFICENCY   . Arthritis     BILATERAL KNEES  . CHF (congestive heart failure)   . Hepatitis     HX OF HEP B   Past Surgical History  Procedure Laterality Date  . Appendectomy    . Tonsillectomy    . Prostate surgery      for enlarged prostate and blockages -  no cancer  . Cardioversion N/A 11/01/2013    Procedure: CARDIOVERSION;  Surgeon: Lesleigh Noe, MD;  Location: [facility] OR;  Service: Cardiovascular;  Laterality: N/A;   Family History  Problem Relation Age of Onset  . Heart disease Father    History  Substance Use Topics  . Smoking status: Never Smoker   . Smokeless tobacco: Never Used  . Alcohol Use: Yes     Comment: glass wine approx. 6 days week    Review of Systems  Constitutional: Negative for fever and chills.  HENT: Negative for sore throat.   Eyes: Negative for redness.  Respiratory: Negative for cough and shortness of breath.   Cardiovascular: Negative for chest pain.  Gastrointestinal: Negative for vomiting, abdominal pain and diarrhea.  Genitourinary: Negative for dysuria and flank pain.  Musculoskeletal: Negative for back pain and neck pain.  Skin: Negative for rash.  Neurological: Negative for numbness and headaches.  Hematological: Does not bruise/bleed easily.  Psychiatric/Behavioral: The patient is not nervous/anxious.       Allergies  Review of patient's allergies indicates no known allergies.  Home Medications   Current Outpatient Rx  Name  Route  Sig  Dispense  Refill  . amiodarone (PACERONE) 200 MG tablet   Oral   Take 1 tablet (200 mg total) by mouth daily.   90 tablet  3   . amLODipine (NORVASC) 5 MG tablet   Oral   Take 1 tablet (5 mg total) by mouth 2 (two) times daily.   180 tablet   1   . Coenzyme Q10 50 MG CAPS   Oral   Take 1 capsule by mouth daily.         . diclofenac sodium (VOLTAREN) 1 % GEL   Topical   Apply 2 g topically 3 (three) times daily as needed (inflammation/pain).         . fish oil-omega-3 fatty acids 1000 MG capsule   Oral   Take 2 g by mouth daily.         . furosemide (LASIX) 80 MG tablet      80 mg 2 (two) times daily.         . metoprolol succinate (TOPROL XL) 25 MG 24 hr tablet   Oral   Take 1 tablet (25 mg total) by mouth daily.   30  tablet   2   . Multiple Vitamin (MULTIVITAMIN WITH MINERALS) TABS tablet   Oral   Take 1 tablet by mouth daily.         Marland Kitchen omeprazole-sodium bicarbonate (ZEGERID) 40-1100 MG per capsule   Oral   Take 1 capsule by mouth daily before breakfast.          . warfarin (COUMADIN) 2.5 MG tablet      Take as directed by coumadin clinic   210 tablet   0     This is 3 months supply    BP 155/71  Pulse 64  Temp(Src) 98.2 F (36.8 C) (Oral)  Resp 18  SpO2 100% Physical Exam  Nursing note and vitals reviewed. Constitutional: He is oriented to person, place, and time. He appears well-developed and well-nourished. No distress.  HENT:  Head: Atraumatic.  Nose: Nose normal.  Mouth/Throat: Oropharynx is clear and moist.  Eyes: Conjunctivae are normal. Pupils are equal, round, and reactive to light. No scleral icterus.  Neck: Neck supple. No tracheal deviation present. No thyromegaly present.  No bruit  Cardiovascular: Normal rate, regular rhythm, normal heart sounds and intact distal pulses.  Exam reveals no gallop and no friction rub.   No murmur heard. Pulmonary/Chest: Effort normal and breath sounds normal. No accessory muscle usage. No respiratory distress.  Abdominal: Soft. Bowel sounds are normal. He exhibits no distension and no mass. There is no tenderness. There is no rebound and no guarding.  Genitourinary:  No cva tenderness  Musculoskeletal: Normal range of motion. He exhibits no tenderness.  CTLS spine, non tender, aligned, no step off.   Neurological: He is alert and oriented to person, place, and time. No cranial nerve deficit.  Motor intact bil.   Pt is alert, oriented. But responds to some questions in confused manner, little insight into why in ED, family concerns, etc.   Skin: Skin is warm and dry. No rash noted. He is not diaphoretic.  Psychiatric: He has a normal mood and affect.    ED Course  Procedures (including critical care time)  Results for orders  placed during the hospital encounter of 01/12/14  CBC      Result Value Ref Range   WBC 8.5  4.0 - 10.5 K/uL   RBC 3.55 (*) 4.22 - 5.81 MIL/uL   Hemoglobin 12.0 (*) 13.0 - 17.0 g/dL   HCT 16.1 (*) 09.6 - 04.5 %   MCV 99.4  78.0 - 100.0 fL   MCH 33.8  26.0 - 34.0 pg   MCHC 34.0  30.0 - 36.0 g/dL   RDW 86.5  78.4 - 69.6 %   Platelets 238  150 - 400 K/uL  COMPREHENSIVE METABOLIC PANEL      Result Value Ref Range   Sodium 140  137 - 147 mEq/L   Potassium 3.8  3.7 - 5.3 mEq/L   Chloride 106  96 - 112 mEq/L   CO2 20  19 - 32 mEq/L   Glucose, Bld 104 (*) 70 - 99 mg/dL   BUN 91 (*) 6 - 23 mg/dL   Creatinine, Ser 2.95 (*) 0.50 - 1.35 mg/dL   Calcium 9.6  8.4 - 28.4 mg/dL   Total Protein 7.5  6.0 - 8.3 g/dL   Albumin 3.2 (*) 3.5 - 5.2 g/dL   AST 20  0 - 37 U/L   ALT 20  0 - 53 U/L   Alkaline Phosphatase 84  39 - 117 U/L   Total Bilirubin 0.6  0.3 - 1.2 mg/dL   GFR calc non Af Amer 12 (*) >90 mL/min   GFR calc Af Amer 14 (*) >90 mL/min  URINALYSIS, ROUTINE W REFLEX MICROSCOPIC      Result Value Ref Range   Color, Urine YELLOW  YELLOW   APPearance CLEAR  CLEAR   Specific Gravity, Urine 1.013  1.005 - 1.030   pH 6.5  5.0 - 8.0   Glucose, UA NEGATIVE  NEGATIVE mg/dL   Hgb urine dipstick MODERATE (*) NEGATIVE   Bilirubin Urine NEGATIVE  NEGATIVE   Ketones, ur NEGATIVE  NEGATIVE mg/dL   Protein, ur 30 (*) NEGATIVE mg/dL   Urobilinogen, UA 0.2  0.0 - 1.0 mg/dL   Nitrite POSITIVE (*) NEGATIVE   Leukocytes, UA SMALL (*) NEGATIVE  PROTIME-INR      Result Value Ref Range   Prothrombin Time 14.7  11.6 - 15.2 seconds   INR 1.17  0.00 - 1.49  URINE MICROSCOPIC-ADD ON      Result Value Ref Range   WBC, UA 7-10  <3 WBC/hpf   RBC / HPF 11-20  <3 RBC/hpf   Bacteria, UA FEW (*) RARE   Urine-Other AMORPHOUS URATES/PHOSPHATES     Ct Head Wo Contrast  01/12/2014   CLINICAL DATA:  Confusion.  Fall.  On Coumadin  EXAM: CT HEAD WITHOUT CONTRAST  TECHNIQUE: Contiguous axial images were obtained  from the base of the skull through the vertex without intravenous contrast.  COMPARISON:  MRI 10/16/2010  FINDINGS: Generalized atrophy.  Mild chronic microvascular ischemia.  Negative for acute infarct, hemorrhage, or mass lesion. Negative for skull fracture.  IMPRESSION: Atrophy and chronic microvascular ischemia.  No acute abnormality.   Electronically Signed   By: Marlan Palau M.D.   On: 01/12/2014 15:27       MDM  Iv ns. Labs. Ct.  Reviewed nursing notes and prior charts for additional history.   Staff unable get urine on in and out cath (pt normally self caths at home).  Pt tried without success. On bladder scan, urine in bladder.  I was able to cath w 22 fr coude cath, sterile technique, clear yellow urine returned, bladder drained, sent specimen to lab for ua, cath d/cd.   pts bun/cr elevated, although c/w prior. pts spouse arrived. She adds that she really noted change in mental status, waxing and waning confusion, in past couple weeks. States as relates renal insuff, has been relatively stable, but that they are in midst of making arrangements  for peritoneal dialyses, and that w severe other recent life events, moving, etc, that those arrangements have been a bit delayed. Only new meds in past 1-2 weeks are ultram.  She states pt with chronic knee pain, 'bone on bone', and that in past couple weeks he did have steroid injection (which he has had numerous times in past), and new rx ultram.  Spouse also notes recent dx vitreous hem, and that coumadin had been held in anticipation surgery, but that things had improved on further ophthy assessment, surgery postponed/cancelled, and just recently back on coumadin.   ?whether possibly noted mental status changes related to combination uremia/CRI/markedly elevated bun, recent move/stress/new environment, medications/ultram.   pts spouse states actually seems a bit better/cleared today.  Pt is conversant, oriented.   ua positive, u cx added  to labs. Rocephin iv.  Discussed results w pt/spouse.  Pt is alert, oriented. No nv. No fever. States feels well and ready for d/c.        Suzi RootsKevin E Iverna Hammac, MD 01/13/14 [phone]

## 2014-01-12 NOTE — ED Notes (Signed)
Patient transported to CT 

## 2014-01-12 NOTE — ED Notes (Signed)
Per EMS, patient's wife reports increased confusion for last two weeks and a fall today. Patient reports knees feeling weaker and weaker the past few days and unable to get up from chair without assistance; he reports today he did not fall, he was trying to sit down in a wheelchair and it rolled and he missed the seat. Wife is not here yet, but EMS reports that she called his regular doctor who he sees for renal failure, and they wanted him to come to ER for evaluation and blood work.

## 2014-01-12 NOTE — Discharge Instructions (Signed)
Take keflex (antibiotic) as prescribed. Drink plenty of fluids.  We sent a urine culture, the results of which will be back in 2 days - have your doctor follow up on that culture result then. Also from today's lab tests, your bun is 91 and creatinine 4.2 - contact your kidney doctor in the next couple days - discuss plan/timing for possible dialyses. Minimize use of any sedating medication, including ultram, as that may also affect your mental status.   Return to ER right away if worse, new symptoms, high fevers, persistent vomiting, change in mental status, weak/faint, other concern.      Urinary Tract Infection Urinary tract infections (UTIs) can develop anywhere along your urinary tract. Your urinary tract is your body's drainage system for removing wastes and extra water. Your urinary tract includes two kidneys, two ureters, a bladder, and a urethra. Your kidneys are a pair of bean-shaped organs. Each kidney is about the size of your fist. They are located below your ribs, one on each side of your spine. CAUSES Infections are caused by microbes, which are microscopic organisms, including fungi, viruses, and bacteria. These organisms are so small that they can only be seen through a microscope. Bacteria are the microbes that most commonly cause UTIs. SYMPTOMS  Symptoms of UTIs may vary by age and gender of the patient and by the location of the infection. Symptoms in young women typically include a frequent and intense urge to urinate and a painful, burning feeling in the bladder or urethra during urination. Older women and men are more likely to be tired, shaky, and weak and have muscle aches and abdominal pain. A fever may mean the infection is in your kidneys. Other symptoms of a kidney infection include pain in your back or sides below the ribs, nausea, and vomiting. DIAGNOSIS To diagnose a UTI, your caregiver will ask you about your symptoms. Your caregiver also will ask to provide a urine  sample. The urine sample will be tested for bacteria and white blood cells. White blood cells are made by your body to help fight infection. TREATMENT  Typically, UTIs can be treated with medication. Because most UTIs are caused by a bacterial infection, they usually can be treated with the use of antibiotics. The choice of antibiotic and length of treatment depend on your symptoms and the type of bacteria causing your infection. HOME CARE INSTRUCTIONS  If you were prescribed antibiotics, take them exactly as your caregiver instructs you. Finish the medication even if you feel better after you have only taken some of the medication.  Drink enough water and fluids to keep your urine clear or pale yellow.  Avoid caffeine, tea, and carbonated beverages. They tend to irritate your bladder.  Empty your bladder often. Avoid holding urine for long periods of time.  Empty your bladder before and after sexual intercourse.  After a bowel movement, women should cleanse from front to back. Use each tissue only once. SEEK MEDICAL CARE IF:   You have back pain.  You develop a fever.  Your symptoms do not begin to resolve within 3 days. SEEK IMMEDIATE MEDICAL CARE IF:   You have severe back pain or lower abdominal pain.  You develop chills.  You have nausea or vomiting.  You have continued burning or discomfort with urination. MAKE SURE YOU:   Understand these instructions.  Will watch your condition.  Will get help right away if you are not doing well or get worse. Document Released: 08/17/2005 Document  Revised: 05/08/2012 Document Reviewed: 12/16/2011 [facility] Patient Information 2014 [place], [place].    Altered Mental Status Altered mental status most often refers to an abnormal change in your responsiveness and awareness. It can affect your speech, thought, mobility, memory, attention span, or alertness. It can range from slight confusion to complete unresponsiveness (coma). Altered  mental status can be a sign of a serious underlying medical condition. Rapid evaluation and medical treatment is necessary for patients having an altered mental status. CAUSES   Low blood sugar (hypoglycemia) or diabetes.  Severe loss of body fluids (dehydration) or a body salt (electrolyte) imbalance.  A stroke or other neurologic problem, such as dementia or delirium.  A head injury or tumor.  A drug or alcohol overdose.  Exposure to toxins or poisons.  Depression, anxiety, and stress.  A low oxygen level (hypoxia).  An infection.  Blood loss.  Twitching or shaking (seizure).  Heart problems, such as heart attack or heart rhythm problems (arrhythmias).  A body temperature that is too low or too high (hypothermia or hyperthermia). DIAGNOSIS  A diagnosis is based on your history, symptoms, physical and neurologic examinations, and diagnostic tests. Diagnostic tests may include:  Measurement of your blood pressure, pulse, breathing, and oxygen levels (vital signs).  Blood tests.  Urine tests.  X-ray exams.  A computerized magnetic scan (magnetic resonance imaging, MRI).  A computerized X-ray scan (computed tomography, CT scan). TREATMENT  Treatment will depend on the cause. Treatment may include:  Management of an underlying medical or mental health condition.  Critical care or support in the hospital. HOME CARE INSTRUCTIONS   Only take over-the-counter or prescription medicines for pain, discomfort, or fever as directed by your caregiver.  Manage underlying conditions as directed by your caregiver.  Eat a healthy, well-balanced diet to maintain strength.  Join a support group or prevention program to cope with the condition or trauma that caused the altered mental status. Ask your caregiver to help choose a program that works for you.  Follow up with your caregiver for further examination, therapy, or testing as directed. SEEK MEDICAL CARE IF:   You feel  unwell or have chills.  You or your family notice a change in your behavior or your alertness.  You have trouble following your caregiver's treatment plan.  You have questions or concerns. SEEK IMMEDIATE MEDICAL CARE IF:   You have a rapid heartbeat or have chest pain.  You have difficulty breathing.  You have a fever.  You have a headache with a stiff neck.  You cough up blood.  You have blood in your urine or stool.  You have severe agitation or confusion. MAKE SURE YOU:   Understand these instructions.  Will watch your condition.  Will get help right away if you are not doing well or get worse. Document Released: 04/27/2010 Document Revised: 01/30/2012 Document Reviewed: 04/27/2010 [facility] Patient Information 2014 [place], [place].    Chronic Kidney Disease Chronic kidney disease occurs when the kidneys are damaged over a long period. The kidneys are two organs that lie on either side of the spine between the middle of the back and the front of the abdomen. The kidneys:   Remove wastes and extra water from the blood.   Produce important hormones. These help keep bones strong, regulate blood pressure, and help create red blood cells.   Balance the fluids and chemicals in the blood and tissues. A small amount of kidney damage may not cause problems, but a large amount  of damage may make it difficult or impossible for the kidneys to work the way they should. If steps are not taken to slow down the kidney damage or stop it from getting worse, the kidneys may stop working permanently. Most of the time, chronic kidney disease does not go away. However, it can often be controlled, and those with the disease can usually live normal lives. CAUSES  The most common causes of chronic kidney disease are diabetes and high blood pressure (hypertension). Chronic kidney disease may also be caused by:   Diseases that cause kidneys' filters to become inflamed.   Diseases that  affect the immune system.   Genetic diseases.   Medicines that damage the kidneys, such as anti-inflammatory medicines.  Poisoning or exposure to toxic substances.   A reoccurring kidney or urinary infection.   A problem with urine flow. This may be caused by:   Cancer.   Kidney stones.   An enlarged prostate in males. SYMPTOMS  Because the kidney damage in chronic kidney disease occurs slowly, symptoms develop slowly and may not be obvious until the kidney damage becomes severe. A person may have a kidney disease for years without showing any symptoms. Symptoms can include:   Swelling (edema) of the legs, ankles, or feet.   Tiredness (lethargy).   Nausea or vomiting.   Confusion.   Problems with urination, such as:   Decreased urine production.   Frequent urination, especially at night.   Frequent accidents in children who are potty trained.   Muscle twitches and cramps.   Shortness of breath.  Weakness.   Persistent itchiness.   Loss of appetite.  Metallic taste in the mouth.  Trouble sleeping.  Slowed development in children.  Short stature in children. DIAGNOSIS  Chronic kidney disease may be detected and diagnosed by tests, including blood, urine, imaging, or kidney biopsy tests.  TREATMENT  Most chronic kidney diseases cannot be cured. Treatment usually involves relieving symptoms and preventing or slowing the progression of the disease. Treatment may include:   A special diet. You may need to avoid alcohol and foods thatare salty and high in potassium.   Medicines. These may:   Lower blood pressure.   Relieve anemia.   Relieve swelling.   Protect the bones. HOME CARE INSTRUCTIONS   Follow your prescribed diet.   Only take over-the-counter or prescription medicines as directed by your caregiver.  Do not take any new medicines (prescription, over-the-counter, or nutritional supplements) unless approved by  your caregiver. Many medicines can worsen your kidney damage or need to have the dose adjusted.   Quit smoking if you are a smoker. Talk to your caregiver about a smoking cessation program.   Keep all follow-up appointments as directed by your caregiver. SEEK IMMEDIATE MEDICAL CARE IF:  Your symptoms get worse or you develop new symptoms.   You develop symptoms of end-stage kidney disease. These include:   Headaches.   Abnormally dark or light skin.   Numbness in the hands or feet.   Easy bruising.   Frequent hiccups.   Menstruation stops.   You have a fever.   You have decreased urine production.   You havepain or bleeding when urinating. MAKE SURE YOU:  Understand these instructions.  Will watch your condition.  Will get help right away if you are not doing well or get worse. FOR MORE INFORMATION  American Association of Kidney Patients: ResidentialShow.is National Kidney Foundation: www.kidney.org American Kidney Fund: FightingMatch.com.ee [facility]: www.lifeoptions.org  and www.kidneyschool.org Document Released: 08/16/2008 Document Revised: 10/24/2012 Document Reviewed: 07/06/2012 [facility] Patient Information 2014 [place], [place].

## 2014-01-13 ENCOUNTER — Ambulatory Visit: Payer: BC Managed Care – PPO

## 2014-01-14 LAB — URINE CULTURE: Colony Count: >=100000

## 2014-01-15 ENCOUNTER — Ambulatory Visit: Payer: Medicare Other

## 2014-01-16 ENCOUNTER — Ambulatory Visit: Payer: BC Managed Care – PPO

## 2014-01-17 ENCOUNTER — Ambulatory Visit (INDEPENDENT_AMBULATORY_CARE_PROVIDER_SITE_OTHER): Payer: BC Managed Care – PPO | Admitting: *Deleted

## 2014-01-17 DIAGNOSIS — I4891 Unspecified atrial fibrillation: Secondary | ICD-10-CM

## 2014-01-17 DIAGNOSIS — Z5181 Encounter for therapeutic drug level monitoring: Secondary | ICD-10-CM

## 2014-01-17 LAB — POCT INR: INR: 1.4

## 2014-01-21 ENCOUNTER — Ambulatory Visit (INDEPENDENT_AMBULATORY_CARE_PROVIDER_SITE_OTHER): Payer: BC Managed Care – PPO | Admitting: Interventional Cardiology

## 2014-01-21 ENCOUNTER — Encounter: Payer: Self-pay | Admitting: Interventional Cardiology

## 2014-01-21 VITALS — BP 160/74 | HR 72 | Ht 73.0 in | Wt 209.0 lb

## 2014-01-21 DIAGNOSIS — N184 Chronic kidney disease, stage 4 (severe): Secondary | ICD-10-CM

## 2014-01-21 DIAGNOSIS — I4891 Unspecified atrial fibrillation: Secondary | ICD-10-CM

## 2014-01-21 DIAGNOSIS — I10 Essential (primary) hypertension: Secondary | ICD-10-CM

## 2014-01-21 DIAGNOSIS — I5032 Chronic diastolic (congestive) heart failure: Secondary | ICD-10-CM

## 2014-01-21 DIAGNOSIS — Z79899 Other long term (current) drug therapy: Secondary | ICD-10-CM

## 2014-01-21 DIAGNOSIS — Z7901 Long term (current) use of anticoagulants: Secondary | ICD-10-CM

## 2014-01-21 NOTE — Patient Instructions (Signed)
Your physician recommends that you continue on your current medications as directed. Please refer to the Current Medication list given to you today.  You have a follow up appt scheduled for 04/24/14 @ 11:15am  Your physician recommends that you return for lab work in: 3 months tsh,hepatic,chest xray

## 2014-01-21 NOTE — Progress Notes (Signed)
Patient ID: Trevor Burch, male   DOB: 23-Jun-1931, 80 y.o.   MRN: [id number]    [address], [address] [place], [place]  [address] Phone: [phone] Fax:  [phone]  Date:  01/21/2014   ID:  Trevor Burch, DOB 1931-06-03, MRN [id number]  PCP:  Farris Has, MD    ASSESSMENT:  1. Chronic diastolic heart failure 2. Paroxysmal atrial fibrillation. He is status post cardioversion and maintaining normal sinus rhythm 3. End stage kidney disease  PLAN:  1. Continue to reduce salt intake  2. Monitor kidney function per Dr. Marisue Humble 3. Call if edema or dyspnea 4. TSH, hepatic panel, and chest x-ray in 3 months.    SUBJECTIVE: Trevor Burch is a 80 y.o. male who feels great. He denies dyspnea. No lower extremity swelling. Energy level has improved. He denies chest pain. He has not had a recent renal followup. Appetite is been stable. He did have a recent UTI that was associated with weakness and falling. This has subsequently improved.   Wt Readings from Last 3 Encounters:  01/21/14 209 lb (94.802 kg)  11/28/13 239 lb (108.41 kg)  11/04/13 212 lb 15.4 oz (96.6 kg)     Past Medical History  Diagnosis Date  . Kidney function abnormal     has approx. 20% function - stablized x several years  . GERD (gastroesophageal reflux disease)   . Hypertension   . Atrial fibrillation   . Community acquired pneumonia   . Hypercholesteremia   . Allergic rhinitis   . Chronic kidney disease     RENAL INSUFFICENCY   . Arthritis     BILATERAL KNEES  . CHF (congestive heart failure)   . Hepatitis     HX OF HEP B    Current Outpatient Prescriptions  Medication Sig Dispense Refill  . amiodarone (PACERONE) 200 MG tablet Take 200 mg by mouth daily.      Marland Kitchen amLODipine (NORVASC) 5 MG tablet Take 5 mg by mouth 2 (two) times daily.      . brimonidine (ALPHAGAN) 0.15 % ophthalmic solution Place 1 drop into the left eye 2 (two) times daily.      . CHOLINE PO Take 300 mg by mouth daily.        . diclofenac sodium (VOLTAREN) 1 % GEL Apply 2 g topically 3 (three) times daily as needed (inflammation/pain).      . fish oil-omega-3 fatty acids 1000 MG capsule Take 2 g by mouth daily.      . furosemide (LASIX) 80 MG tablet Take 80 mg by mouth 2 (two) times daily.       Marland Kitchen ibuprofen (ADVIL,MOTRIN) 200 MG tablet Take 400 mg by mouth every 6 (six) hours as needed for mild pain.      . metoprolol succinate (TOPROL-XL) 25 MG 24 hr tablet Take 25 mg by mouth daily.      . Multiple Vitamin (MULTIVITAMIN WITH MINERALS) TABS tablet Take 1 tablet by mouth daily.      Marland Kitchen omeprazole-sodium bicarbonate (ZEGERID) 40-1100 MG per capsule Take 1 capsule by mouth every evening.       . traMADol (ULTRAM) 50 MG tablet Take 50 mg by mouth every 6 (six) hours as needed for moderate pain.      . Travoprost, BAK Free, (TRAVATAN) 0.004 % SOLN ophthalmic solution Place 1 drop into the left eye at bedtime.      Marland Kitchen warfarin (COUMADIN) 2.5 MG tablet Take 5 mg by mouth  daily. *starting 01/12/14, doing 5mg  daily      . Coenzyme Q10 50 MG CAPS Take 1 capsule by mouth daily.       No current facility-administered medications for this visit.    Allergies:   No Known Allergies  Social History:  The patient  reports that he has never smoked. He has never used smokeless tobacco. He reports that he drinks alcohol. He reports that he does not use illicit drugs.   ROS:  Please see the history of present illness.   No bleeding on Coumadin.   All other systems reviewed and negative.   OBJECTIVE: VS:  BP 160/74  Pulse 72  Ht 6\' 1"  (1.854 m)  Wt 209 lb (94.802 kg)  BMI 27.58 kg/m2 Well nourished, well developed, in no acute distress, appears older than stated age HEENT: normal Neck: JVD flat. Carotid bruit absent  Cardiac:  normal S1, S2; RRR; no murmur Lungs:  clear to auscultation bilaterally, no wheezing, rhonchi or rales Abd: soft, nontender, no hepatomegaly Ext: Edema  Absent . Pulses 2+ and symmetric  Skin: warm and  dry Neuro:  CNs 2-12 intact, no focal abnormalities noted  EKG:  Not performed       Signed, Darci NeedleHenry W. B. Sakai Heinle III, MD 01/21/2014 2:25 PM

## 2014-01-22 ENCOUNTER — Ambulatory Visit (INDEPENDENT_AMBULATORY_CARE_PROVIDER_SITE_OTHER): Payer: Medicare Other | Admitting: Ophthalmology

## 2014-01-22 DIAGNOSIS — H43819 Vitreous degeneration, unspecified eye: Secondary | ICD-10-CM

## 2014-01-22 DIAGNOSIS — H35039 Hypertensive retinopathy, unspecified eye: Secondary | ICD-10-CM

## 2014-01-22 DIAGNOSIS — I10 Essential (primary) hypertension: Secondary | ICD-10-CM

## 2014-01-22 DIAGNOSIS — H2100 Hyphema, unspecified eye: Secondary | ICD-10-CM

## 2014-01-22 DIAGNOSIS — H4310 Vitreous hemorrhage, unspecified eye: Secondary | ICD-10-CM

## 2014-01-23 ENCOUNTER — Ambulatory Visit: Payer: Medicare Other | Attending: Family Medicine

## 2014-01-23 DIAGNOSIS — R269 Unspecified abnormalities of gait and mobility: Secondary | ICD-10-CM | POA: Insufficient documentation

## 2014-01-23 DIAGNOSIS — R262 Difficulty in walking, not elsewhere classified: Secondary | ICD-10-CM | POA: Insufficient documentation

## 2014-01-23 DIAGNOSIS — M6281 Muscle weakness (generalized): Secondary | ICD-10-CM | POA: Insufficient documentation

## 2014-01-23 DIAGNOSIS — IMO0001 Reserved for inherently not codable concepts without codable children: Secondary | ICD-10-CM | POA: Insufficient documentation

## 2014-01-27 ENCOUNTER — Ambulatory Visit (INDEPENDENT_AMBULATORY_CARE_PROVIDER_SITE_OTHER): Payer: BC Managed Care – PPO

## 2014-01-27 DIAGNOSIS — Z5181 Encounter for therapeutic drug level monitoring: Secondary | ICD-10-CM

## 2014-01-27 DIAGNOSIS — I4891 Unspecified atrial fibrillation: Secondary | ICD-10-CM

## 2014-01-27 LAB — POCT INR: INR: 1.9

## 2014-01-28 ENCOUNTER — Encounter: Payer: PRIVATE HEALTH INSURANCE | Admitting: Physical Therapy

## 2014-01-29 ENCOUNTER — Ambulatory Visit: Payer: Medicare Other

## 2014-02-04 ENCOUNTER — Ambulatory Visit: Payer: Medicare Other

## 2014-02-06 ENCOUNTER — Ambulatory Visit: Payer: Medicare Other

## 2014-02-06 ENCOUNTER — Telehealth: Payer: Self-pay

## 2014-02-06 NOTE — Telephone Encounter (Signed)
error 

## 2014-02-10 ENCOUNTER — Encounter (INDEPENDENT_AMBULATORY_CARE_PROVIDER_SITE_OTHER): Payer: Medicare Other | Admitting: Ophthalmology

## 2014-02-10 ENCOUNTER — Encounter: Payer: Self-pay | Admitting: Interventional Cardiology

## 2014-02-11 ENCOUNTER — Ambulatory Visit: Payer: Medicare Other

## 2014-02-12 ENCOUNTER — Ambulatory Visit (INDEPENDENT_AMBULATORY_CARE_PROVIDER_SITE_OTHER): Payer: BC Managed Care – PPO | Admitting: Pharmacist

## 2014-02-12 DIAGNOSIS — I4891 Unspecified atrial fibrillation: Secondary | ICD-10-CM

## 2014-02-12 DIAGNOSIS — Z5181 Encounter for therapeutic drug level monitoring: Secondary | ICD-10-CM

## 2014-02-12 LAB — POCT INR: INR: 1.8

## 2014-02-13 ENCOUNTER — Ambulatory Visit: Payer: Medicare Other

## 2014-02-18 ENCOUNTER — Ambulatory Visit: Payer: Medicare Other

## 2014-02-20 ENCOUNTER — Ambulatory Visit: Payer: Medicare Other | Attending: Family Medicine

## 2014-02-20 DIAGNOSIS — R262 Difficulty in walking, not elsewhere classified: Secondary | ICD-10-CM | POA: Insufficient documentation

## 2014-02-20 DIAGNOSIS — M6281 Muscle weakness (generalized): Secondary | ICD-10-CM | POA: Insufficient documentation

## 2014-02-20 DIAGNOSIS — R269 Unspecified abnormalities of gait and mobility: Secondary | ICD-10-CM | POA: Insufficient documentation

## 2014-02-20 DIAGNOSIS — IMO0001 Reserved for inherently not codable concepts without codable children: Secondary | ICD-10-CM | POA: Insufficient documentation

## 2014-02-25 ENCOUNTER — Ambulatory Visit: Payer: Medicare Other

## 2014-02-27 ENCOUNTER — Ambulatory Visit: Payer: Medicare Other

## 2014-02-27 ENCOUNTER — Ambulatory Visit (INDEPENDENT_AMBULATORY_CARE_PROVIDER_SITE_OTHER): Payer: BC Managed Care – PPO | Admitting: Pharmacist

## 2014-02-27 DIAGNOSIS — Z5181 Encounter for therapeutic drug level monitoring: Secondary | ICD-10-CM

## 2014-02-27 DIAGNOSIS — I4891 Unspecified atrial fibrillation: Secondary | ICD-10-CM

## 2014-02-27 LAB — POCT INR: INR: 1.4

## 2014-03-03 ENCOUNTER — Encounter: Payer: Self-pay | Admitting: Cardiovascular Disease

## 2014-03-03 ENCOUNTER — Ambulatory Visit: Payer: Medicare Other | Admitting: Interventional Cardiology

## 2014-03-03 ENCOUNTER — Other Ambulatory Visit (HOSPITAL_COMMUNITY): Payer: Self-pay | Admitting: Interventional Cardiology

## 2014-03-03 ENCOUNTER — Ambulatory Visit (HOSPITAL_COMMUNITY): Payer: BC Managed Care – PPO | Attending: Cardiovascular Disease | Admitting: Cardiology

## 2014-03-03 DIAGNOSIS — I5032 Chronic diastolic (congestive) heart failure: Secondary | ICD-10-CM

## 2014-03-03 DIAGNOSIS — I509 Heart failure, unspecified: Secondary | ICD-10-CM

## 2014-03-03 NOTE — Progress Notes (Signed)
Echo performed. 

## 2014-03-04 ENCOUNTER — Other Ambulatory Visit: Payer: Self-pay | Admitting: Interventional Cardiology

## 2014-03-04 ENCOUNTER — Ambulatory Visit: Payer: Medicare Other

## 2014-03-06 ENCOUNTER — Ambulatory Visit: Payer: Medicare Other

## 2014-03-11 ENCOUNTER — Ambulatory Visit: Payer: Medicare Other

## 2014-03-12 ENCOUNTER — Ambulatory Visit (INDEPENDENT_AMBULATORY_CARE_PROVIDER_SITE_OTHER): Payer: BC Managed Care – PPO | Admitting: *Deleted

## 2014-03-12 DIAGNOSIS — Z5181 Encounter for therapeutic drug level monitoring: Secondary | ICD-10-CM

## 2014-03-12 DIAGNOSIS — I4891 Unspecified atrial fibrillation: Secondary | ICD-10-CM

## 2014-03-12 LAB — POCT INR: INR: 1.8

## 2014-03-13 ENCOUNTER — Ambulatory Visit: Payer: Medicare Other

## 2014-03-18 ENCOUNTER — Ambulatory Visit: Payer: Medicare Other

## 2014-03-20 ENCOUNTER — Ambulatory Visit: Payer: Medicare Other

## 2014-03-24 ENCOUNTER — Ambulatory Visit (INDEPENDENT_AMBULATORY_CARE_PROVIDER_SITE_OTHER): Payer: Medicare Other | Admitting: *Deleted

## 2014-03-24 DIAGNOSIS — Z5181 Encounter for therapeutic drug level monitoring: Secondary | ICD-10-CM

## 2014-03-24 DIAGNOSIS — I4891 Unspecified atrial fibrillation: Secondary | ICD-10-CM

## 2014-03-24 LAB — POCT INR: INR: 2.7

## 2014-03-25 ENCOUNTER — Ambulatory Visit: Payer: Medicare Other | Attending: Family Medicine

## 2014-03-25 ENCOUNTER — Telehealth: Payer: Self-pay

## 2014-03-25 DIAGNOSIS — M6281 Muscle weakness (generalized): Secondary | ICD-10-CM | POA: Insufficient documentation

## 2014-03-25 DIAGNOSIS — R262 Difficulty in walking, not elsewhere classified: Secondary | ICD-10-CM | POA: Insufficient documentation

## 2014-03-25 DIAGNOSIS — IMO0001 Reserved for inherently not codable concepts without codable children: Secondary | ICD-10-CM | POA: Insufficient documentation

## 2014-03-25 DIAGNOSIS — R269 Unspecified abnormalities of gait and mobility: Secondary | ICD-10-CM | POA: Insufficient documentation

## 2014-03-25 NOTE — Telephone Encounter (Signed)
Wife called to let me know patient has had hematuria this morning, and apparently it was dark red.  His INR was 2.7 tomorrow.  On warfarin for h/o Afib.  He is also taking fish oil 2 g/d.  Patient was very active yesterday according to wife.  She denies NSAID use.  Denies painful urination.  Patient has an appointment with urology later today.  Patient notified to hold warfarin today, stop fish oil for now, and f/u with urology today.  Patient will restart warfarin tomorrow unless instructed otherwise by urology.  They will call back if new medications are started by urology.

## 2014-03-27 ENCOUNTER — Telehealth: Payer: Self-pay | Admitting: Interventional Cardiology

## 2014-03-27 ENCOUNTER — Ambulatory Visit: Payer: Medicare Other

## 2014-03-27 NOTE — Telephone Encounter (Signed)
Mrs. Trevor Burch is aware that the patient can hold the Coumadin for 3 days per Dr.Smith.

## 2014-03-27 NOTE — Telephone Encounter (Signed)
New message      Dr Lenn Sinkdahlsteadt want pt to stop coumadin for 3 days.  Start back on Sunday.  He has blood in his urine and he is now on cephilesin for 1 week.  Will this be ok?

## 2014-03-27 NOTE — Telephone Encounter (Signed)
Ok to hold coumadin 3 days

## 2014-03-27 NOTE — Telephone Encounter (Signed)
Patient has hx afib, no history CVA, blood clot or valve replacement.  Forwarding message to Dr. Katrinka BlazingSmith for approval to stop Coumadin x 3 days.  Per Cloyde ReamsErika Johnson, RN in [facility], Cephalexin does not interfere with Coumadin.

## 2014-03-31 ENCOUNTER — Ambulatory Visit: Payer: Medicare Other

## 2014-04-07 ENCOUNTER — Ambulatory Visit (INDEPENDENT_AMBULATORY_CARE_PROVIDER_SITE_OTHER): Payer: Medicare Other | Admitting: Pharmacist

## 2014-04-07 DIAGNOSIS — Z5181 Encounter for therapeutic drug level monitoring: Secondary | ICD-10-CM

## 2014-04-07 DIAGNOSIS — I4891 Unspecified atrial fibrillation: Secondary | ICD-10-CM

## 2014-04-07 LAB — POCT INR: INR: 1.2

## 2014-04-08 ENCOUNTER — Ambulatory Visit: Payer: Medicare Other

## 2014-04-10 ENCOUNTER — Ambulatory Visit: Payer: Medicare Other

## 2014-04-15 ENCOUNTER — Ambulatory Visit: Payer: Medicare Other

## 2014-04-17 ENCOUNTER — Ambulatory Visit: Payer: Medicare Other

## 2014-04-21 ENCOUNTER — Ambulatory Visit: Payer: Medicare Other | Attending: Family Medicine

## 2014-04-21 ENCOUNTER — Ambulatory Visit
Admission: RE | Admit: 2014-04-21 | Discharge: 2014-04-21 | Disposition: A | Discharge status: Home / Self Care | Payer: Medicare Other | Source: Ambulatory Visit | Attending: Interventional Cardiology | Admitting: Interventional Cardiology

## 2014-04-21 DIAGNOSIS — R262 Difficulty in walking, not elsewhere classified: Secondary | ICD-10-CM | POA: Insufficient documentation

## 2014-04-21 DIAGNOSIS — IMO0001 Reserved for inherently not codable concepts without codable children: Secondary | ICD-10-CM | POA: Insufficient documentation

## 2014-04-21 DIAGNOSIS — R269 Unspecified abnormalities of gait and mobility: Secondary | ICD-10-CM | POA: Insufficient documentation

## 2014-04-21 DIAGNOSIS — M6281 Muscle weakness (generalized): Secondary | ICD-10-CM | POA: Insufficient documentation

## 2014-04-21 DIAGNOSIS — Z79899 Other long term (current) drug therapy: Secondary | ICD-10-CM

## 2014-04-23 ENCOUNTER — Ambulatory Visit: Payer: Medicare Other

## 2014-04-24 ENCOUNTER — Encounter: Payer: Self-pay | Admitting: Interventional Cardiology

## 2014-04-24 ENCOUNTER — Ambulatory Visit: Payer: Self-pay | Admitting: Pharmacist

## 2014-04-24 ENCOUNTER — Ambulatory Visit (INDEPENDENT_AMBULATORY_CARE_PROVIDER_SITE_OTHER): Payer: Medicare Other | Admitting: Interventional Cardiology

## 2014-04-24 VITALS — BP 145/80 | HR 65 | Ht 73.0 in | Wt 209.0 lb

## 2014-04-24 DIAGNOSIS — Z79899 Other long term (current) drug therapy: Secondary | ICD-10-CM

## 2014-04-24 DIAGNOSIS — I4891 Unspecified atrial fibrillation: Secondary | ICD-10-CM

## 2014-04-24 DIAGNOSIS — I5032 Chronic diastolic (congestive) heart failure: Secondary | ICD-10-CM

## 2014-04-24 DIAGNOSIS — Z5181 Encounter for therapeutic drug level monitoring: Secondary | ICD-10-CM

## 2014-04-24 DIAGNOSIS — N184 Chronic kidney disease, stage 4 (severe): Secondary | ICD-10-CM

## 2014-04-24 LAB — HEPATIC FUNCTION PANEL
ALBUMIN: 2.8 g/dL — AB (ref 3.5–5.2)
ALK PHOS: 66 U/L (ref 39–117)
ALT: 15 U/L (ref 0–53)
AST: 17 U/L (ref 0–37)
BILIRUBIN TOTAL: 0.6 mg/dL (ref 0.2–1.2)
Bilirubin, Direct: 0.1 mg/dL (ref 0.0–0.3)
Total Protein: 5.7 g/dL — ABNORMAL LOW (ref 6.0–8.3)

## 2014-04-24 LAB — TSH: TSH: 1.28 u[IU]/mL (ref 0.35–4.50)

## 2014-04-24 MED ORDER — ASPIRIN EC 81 MG PO TBEC: 81.0000 mg | DELAYED_RELEASE_TABLET | Freq: Every day | ORAL | Status: AC

## 2014-04-24 NOTE — Patient Instructions (Signed)
Your physician has recommended you make the following change in your medication:  1) STOP Coumadin 2) START Aspirin 81mg  daily in 2 days  Lab today: Tsh, Hepatic  Your physician recommends that you return for lab work and chest xray: 6 months  Your physician wants you to follow-up in: 6 months You will receive a reminder letter in the mail two months in advance. If you don't receive a letter, please call our office to schedule the follow-up appointment.

## 2014-04-24 NOTE — Progress Notes (Signed)
Patient ID: Trevor Burch, male   DOB: 10/22/1931, 80 y.o.   MRN: [id number]    [address] [place], [place]  [address] Phone: [phone] Fax:  [phone]  Date:  04/24/2014   ID:  Trevor Burch, DOB 09/08/1931, MRN [id number]  PCP:  Farris HasMORROW, AARON, MD   ASSESSMENT:  1. paroxysmal atrial fibrillation, clinically in normal sinus rhythm 2. Anticoagulation on Coumadin. The patient has decided to discontinue this medication 3. Chronic diastolic heart failure, stable 4. Hypertension, controlled 5. Amiodarone therapy  PLAN:  1. He was to discontinue Coumadin; we have discussed implications and he understands that he accepts a higher risk of stroke. He is wife both understand to feel his quality of life and psychological well-being will be improved. 2. TSH and hepatic panel today 3. Six-month clinical followup with chest x-ray, TSH, and hepatic panel   SUBJECTIVE: Trevor Burch is a 80 y.o. male who feels great. He wants to be off Coumadin. It bothers him that he may have sudden bleeding. He states that whenever he sees other doctors to consider having "things fixed", they say "oh, you're on Coumadin". He denies angina. He has not had syncope. No palpitations. Breathing is stable.   Wt Readings from Last 3 Encounters:  04/24/14 209 lb (94.802 kg)  01/21/14 209 lb (94.802 kg)  11/28/13 239 lb (108.41 kg)     Past Medical History  Diagnosis Date  . Kidney function abnormal     has approx. 20% function - stablized x several years  . GERD (gastroesophageal reflux disease)   . Hypertension   . Atrial fibrillation   . Community acquired pneumonia   . Hypercholesteremia   . Allergic rhinitis   . Chronic kidney disease     RENAL INSUFFICENCY   . Arthritis     BILATERAL KNEES  . CHF (congestive heart failure)   . Hepatitis     HX OF HEP B    Current Outpatient Prescriptions  Medication Sig Dispense Refill  . amiodarone (PACERONE) 200 MG tablet Take 200 mg by  mouth daily.      Marland Kitchen. amLODipine (NORVASC) 5 MG tablet Take 5 mg by mouth 2 (two) times daily.      . brimonidine (ALPHAGAN) 0.15 % ophthalmic solution Place 1 drop into the left eye 2 (two) times daily.      . brimonidine (ALPHAGAN) 0.2 % ophthalmic solution Place 1 drop into the left eye 2 (two) times daily.      . CHOLINE PO Take 300 mg by mouth daily.      Marland Kitchen. dexlansoprazole (DEXILANT) 60 MG capsule Take 60 mg by mouth daily.      . diclofenac sodium (VOLTAREN) 1 % GEL Apply 2 g topically 3 (three) times daily as needed (inflammation/pain).      . Dorzolamide HCl-Timolol Mal PF 22.3-6.8 MG/ML SOLN Place 1 drop into the left eye 2 (two) times daily.      . furosemide (LASIX) 80 MG tablet Take 80 mg by mouth 2 (two) times daily.       Marland Kitchen. ibuprofen (ADVIL,MOTRIN) 200 MG tablet Take 400 mg by mouth every 6 (six) hours as needed for mild pain.      . Multiple Vitamin (MULTIVITAMIN WITH MINERALS) TABS tablet Take 1 tablet by mouth daily.      . prednisoLONE acetate (PRED FORTE) 1 % ophthalmic suspension Place 1 drop into the left eye 4 (four) times daily.      .Marland Kitchen  traMADol (ULTRAM) 50 MG tablet Take 50 mg by mouth every 6 (six) hours as needed for moderate pain.      Marland Kitchen warfarin (COUMADIN) 2.5 MG tablet 2 tablets on all days except 3 tablets on Wednesday or as directed by coumadin clinic  210 tablet  1   No current facility-administered medications for this visit.    Allergies:   No Known Allergies  Social History:  The patient  reports that he has never smoked. He has never used smokeless tobacco. He reports that he drinks alcohol. He reports that he does not use illicit drugs.   ROS:  Please see the history of present illness.   Appetite is stable. No chills or fever. Kidney function has improved.   All other systems reviewed and negative.   OBJECTIVE: VS:  Ht 6\' 1"  (1.854 m)  Wt 209 lb (94.802 kg)  BMI 27.58 kg/m2 Well nourished, well developed, in no acute distress, elderly but healthy HEENT:  normal Neck: JVD flat. Carotid bruit absent  Cardiac:  normal S1, S2; RRR; no murmur Lungs:  clear to auscultation bilaterally, no wheezing, rhonchi or rales Abd: soft, nontender, no hepatomegaly Ext: Edema absent. Pulses 2+ and symmetric Skin: warm and dry Neuro:  CNs 2-12 intact, no focal abnormalities noted  EKG:  Not repeated       Signed, Darci Needle III, MD 04/24/2014 12:05 PM

## 2014-04-28 ENCOUNTER — Other Ambulatory Visit: Payer: Self-pay | Admitting: Urology

## 2014-04-28 ENCOUNTER — Telehealth: Payer: Self-pay

## 2014-04-28 ENCOUNTER — Other Ambulatory Visit (HOSPITAL_COMMUNITY): Payer: Self-pay | Admitting: Internal Medicine

## 2014-04-28 DIAGNOSIS — N1330 Unspecified hydronephrosis: Secondary | ICD-10-CM

## 2014-04-28 NOTE — Telephone Encounter (Signed)
Message copied by Jarvis Newcomer on Mon Apr 28, 2014 10:42 AM ------      Message from: Verdis Prime      Created: Wed Apr 23, 2014  5:23 PM       CXR okay ------

## 2014-04-28 NOTE — Telephone Encounter (Signed)
pt wife given cxr and lab results.ok.pt wife verbalized understanding.

## 2014-04-28 NOTE — Telephone Encounter (Signed)
Message copied by Jarvis Newcomer on Mon Apr 28, 2014 10:20 AM ------      Message from: Verdis Prime      Created: Fri Apr 25, 2014 10:01 AM       Liver and thyroid studies are okay. ------

## 2014-04-29 ENCOUNTER — Ambulatory Visit: Payer: Medicare Other

## 2014-05-01 ENCOUNTER — Ambulatory Visit: Payer: Medicare Other

## 2014-05-01 ENCOUNTER — Other Ambulatory Visit: Payer: Self-pay | Admitting: Interventional Cardiology

## 2014-05-06 ENCOUNTER — Ambulatory Visit: Payer: Medicare Other

## 2014-05-07 ENCOUNTER — Ambulatory Visit (HOSPITAL_COMMUNITY)
Admission: RE | Admit: 2014-05-07 | Discharge: 2014-05-07 | Disposition: A | Discharge status: Home / Self Care | Payer: Medicare Other | Source: Ambulatory Visit | Attending: Internal Medicine | Admitting: Internal Medicine

## 2014-05-07 ENCOUNTER — Encounter (HOSPITAL_COMMUNITY): Payer: Self-pay

## 2014-05-07 DIAGNOSIS — N1330 Unspecified hydronephrosis: Secondary | ICD-10-CM | POA: Insufficient documentation

## 2014-05-07 DIAGNOSIS — N2881 Hypertrophy of kidney: Secondary | ICD-10-CM | POA: Insufficient documentation

## 2014-05-07 MED ORDER — TECHNETIUM TC 99M MERTIATIDE
15.4000 | Freq: Once | INTRAVENOUS | Status: AC | PRN
  Administered 2014-05-07: 15.4 via INTRAVENOUS

## 2014-05-07 MED ORDER — FUROSEMIDE 10 MG/ML IJ SOLN
60.0000 mg | Freq: Once | INTRAMUSCULAR | Status: AC
  Administered 2014-05-07: 43 mg via INTRAVENOUS
  Filled 2014-05-07: qty 6

## 2014-05-08 ENCOUNTER — Ambulatory Visit: Payer: Medicare Other

## 2014-05-13 ENCOUNTER — Ambulatory Visit: Payer: Medicare Other

## 2014-05-15 ENCOUNTER — Ambulatory Visit: Payer: Medicare Other

## 2014-05-19 ENCOUNTER — Other Ambulatory Visit: Payer: Self-pay | Admitting: Family Medicine

## 2014-05-19 ENCOUNTER — Ambulatory Visit
Admission: RE | Admit: 2014-05-19 | Discharge: 2014-05-19 | Disposition: A | Discharge status: Home / Self Care | Payer: Medicare Other | Source: Ambulatory Visit | Attending: Family Medicine | Admitting: Family Medicine

## 2014-05-19 DIAGNOSIS — R0602 Shortness of breath: Secondary | ICD-10-CM

## 2014-05-21 ENCOUNTER — Ambulatory Visit: Payer: Medicare Other | Attending: Family Medicine

## 2014-05-21 DIAGNOSIS — R262 Difficulty in walking, not elsewhere classified: Secondary | ICD-10-CM | POA: Insufficient documentation

## 2014-05-21 DIAGNOSIS — M6281 Muscle weakness (generalized): Secondary | ICD-10-CM | POA: Diagnosis not present

## 2014-05-21 DIAGNOSIS — IMO0001 Reserved for inherently not codable concepts without codable children: Secondary | ICD-10-CM | POA: Diagnosis present

## 2014-05-21 DIAGNOSIS — R269 Unspecified abnormalities of gait and mobility: Secondary | ICD-10-CM | POA: Diagnosis not present

## 2014-05-28 ENCOUNTER — Ambulatory Visit: Payer: Medicare Other

## 2014-05-28 DIAGNOSIS — IMO0001 Reserved for inherently not codable concepts without codable children: Secondary | ICD-10-CM | POA: Diagnosis not present

## 2014-06-04 ENCOUNTER — Ambulatory Visit: Payer: Medicare Other

## 2014-08-26 ENCOUNTER — Other Ambulatory Visit: Payer: Self-pay | Admitting: Family Medicine

## 2014-08-26 DIAGNOSIS — R609 Edema, unspecified: Secondary | ICD-10-CM

## 2014-08-28 ENCOUNTER — Ambulatory Visit
Admission: RE | Admit: 2014-08-28 | Discharge: 2014-08-28 | Disposition: A | Discharge status: Home / Self Care | Payer: Medicare Other | Source: Ambulatory Visit | Attending: Family Medicine | Admitting: Family Medicine

## 2014-08-28 DIAGNOSIS — R609 Edema, unspecified: Secondary | ICD-10-CM

## 2014-08-29 ENCOUNTER — Other Ambulatory Visit: Payer: Self-pay | Admitting: Family Medicine

## 2014-08-29 DIAGNOSIS — M79605 Pain in left leg: Secondary | ICD-10-CM

## 2014-08-29 DIAGNOSIS — R600 Localized edema: Secondary | ICD-10-CM

## 2014-09-25 ENCOUNTER — Other Ambulatory Visit: Payer: Self-pay | Admitting: Interventional Cardiology

## 2014-10-13 ENCOUNTER — Other Ambulatory Visit: Payer: Self-pay | Admitting: Physical Medicine and Rehabilitation

## 2014-10-13 DIAGNOSIS — N281 Cyst of kidney, acquired: Secondary | ICD-10-CM

## 2014-10-22 ENCOUNTER — Ambulatory Visit: Payer: No Typology Code available for payment source | Admitting: Interventional Cardiology

## 2014-10-30 ENCOUNTER — Other Ambulatory Visit: Payer: Self-pay | Admitting: Family Medicine

## 2014-10-30 ENCOUNTER — Ambulatory Visit
Admission: RE | Admit: 2014-10-30 | Discharge: 2014-10-30 | Disposition: A | Discharge status: Home / Self Care | Payer: Medicare Other | Source: Ambulatory Visit | Attending: Family Medicine | Admitting: Family Medicine

## 2014-10-30 DIAGNOSIS — R079 Chest pain, unspecified: Secondary | ICD-10-CM

## 2014-11-04 IMAGING — CR DG CHEST 2V
2 series · 2 of 2 positions shown · non-contrast
Comparison: 04/21/2014.

CLINICAL DATA: Pneumonia, recent onset shortness of breath.

EXAM:
CHEST  2 VIEW

[view not recorded (1 of 2)]
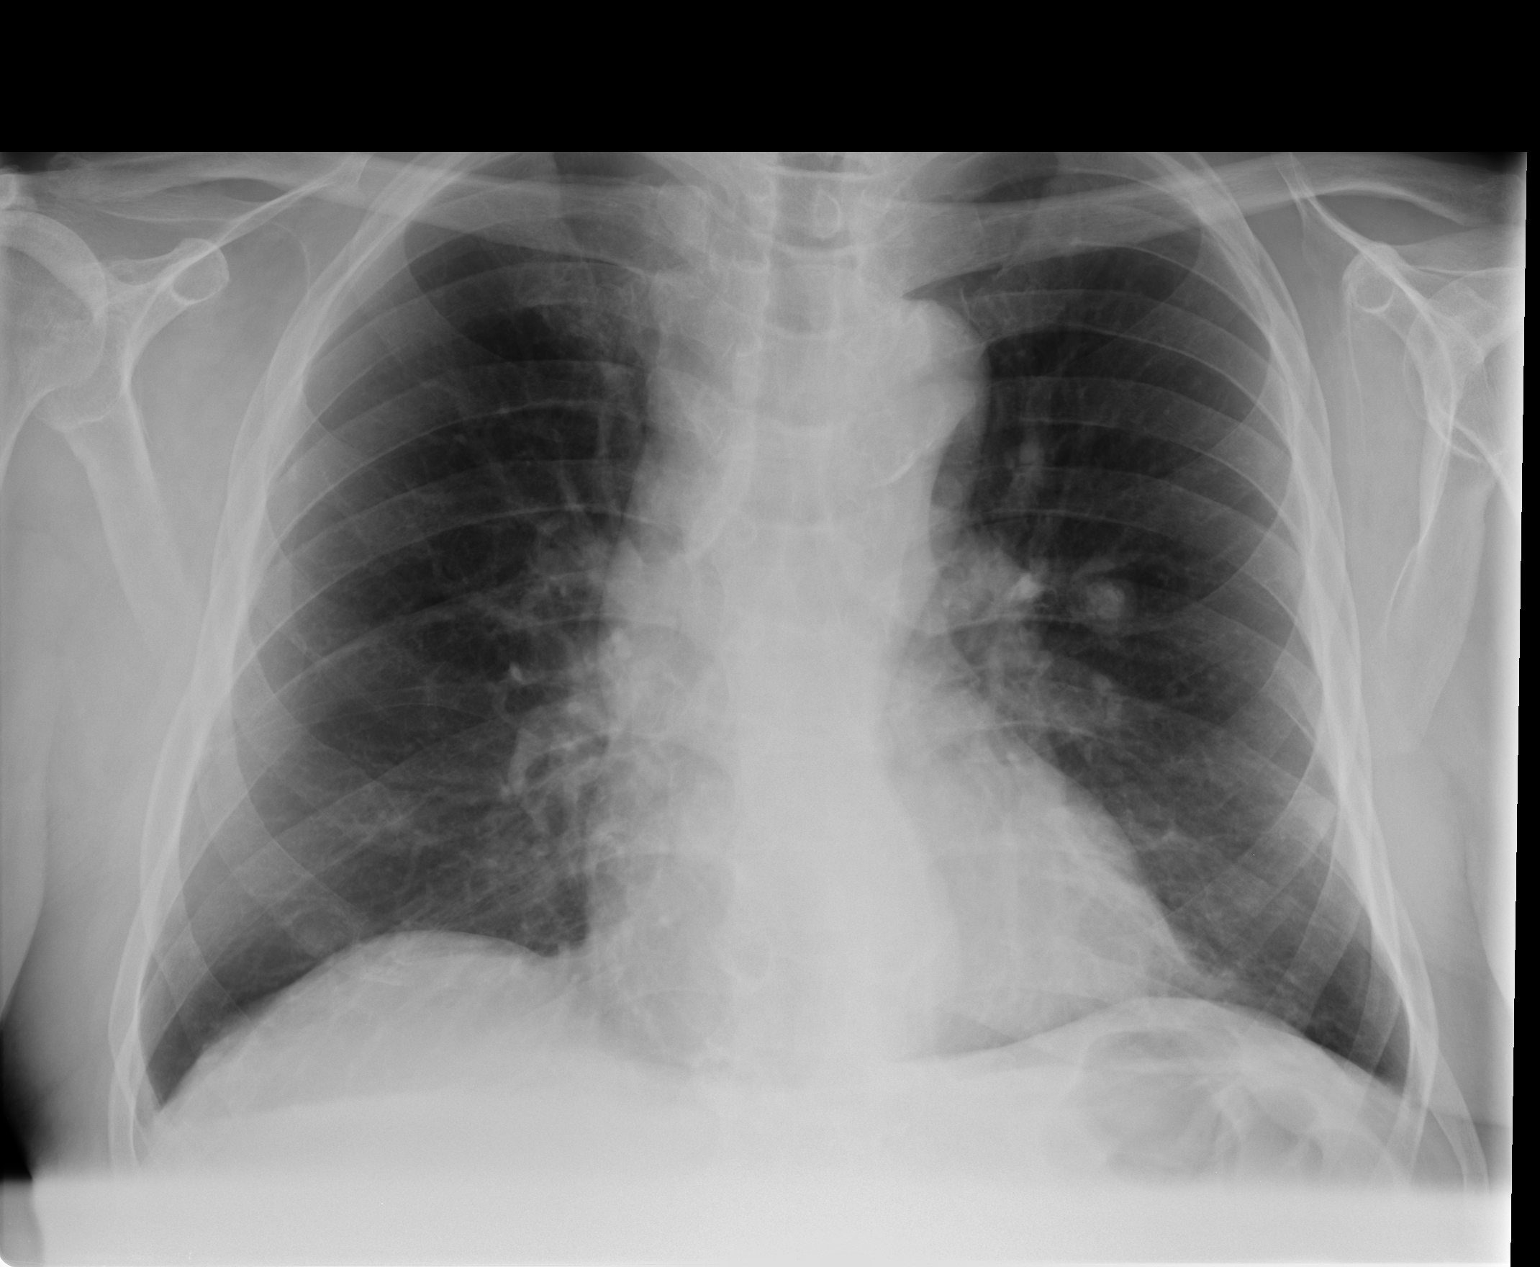

[view not recorded (2 of 2)]
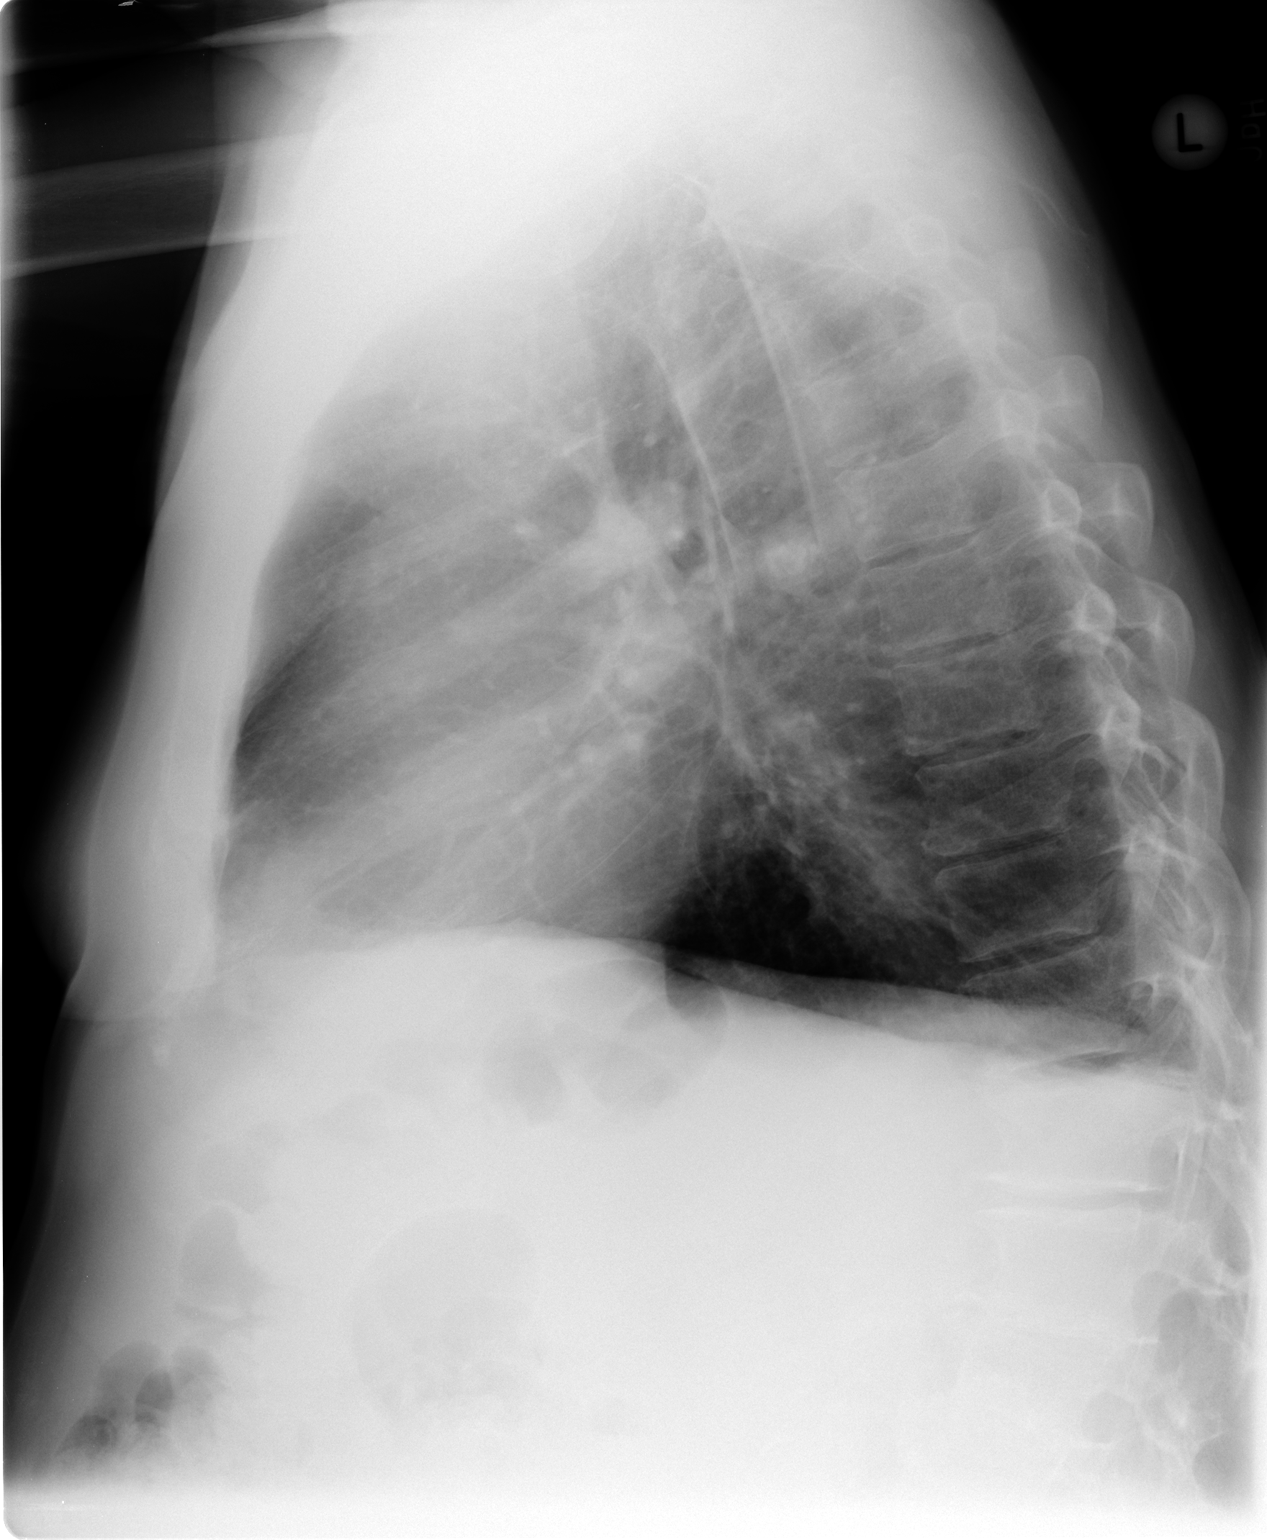

[2 of 2 positions shown; findings below may reference images not displayed]

FINDINGS: Trachea is midline. Heart size normal. Lungs are clear. No pleural
fluid.
IMPRESSION: No acute findings.

## 2014-11-05 ENCOUNTER — Institutional Professional Consult (permissible substitution): Payer: Medicare Other | Admitting: Internal Medicine

## 2014-11-06 ENCOUNTER — Ambulatory Visit (INDEPENDENT_AMBULATORY_CARE_PROVIDER_SITE_OTHER): Payer: Medicare Other | Admitting: Internal Medicine

## 2014-11-06 ENCOUNTER — Other Ambulatory Visit (INDEPENDENT_AMBULATORY_CARE_PROVIDER_SITE_OTHER): Payer: Medicare Other

## 2014-11-06 ENCOUNTER — Encounter: Payer: Self-pay | Admitting: Internal Medicine

## 2014-11-06 VITALS — BP 160/74 | HR 62 | Ht 73.0 in | Wt 212.0 lb

## 2014-11-06 DIAGNOSIS — I10 Essential (primary) hypertension: Secondary | ICD-10-CM

## 2014-11-06 DIAGNOSIS — R05 Cough: Secondary | ICD-10-CM

## 2014-11-06 DIAGNOSIS — J90 Pleural effusion, not elsewhere classified: Secondary | ICD-10-CM

## 2014-11-06 DIAGNOSIS — R0600 Dyspnea, unspecified: Secondary | ICD-10-CM

## 2014-11-06 DIAGNOSIS — R059 Cough, unspecified: Secondary | ICD-10-CM

## 2014-11-06 LAB — HEPATIC FUNCTION PANEL
ALBUMIN: 2.9 g/dL — AB (ref 3.5–5.2)
ALK PHOS: 67 U/L (ref 39–117)
ALT: 17 U/L (ref 0–53)
AST: 18 U/L (ref 0–37)
BILIRUBIN DIRECT: 0.1 mg/dL (ref 0.0–0.3)
Total Bilirubin: 0.5 mg/dL (ref 0.2–1.2)
Total Protein: 6.6 g/dL (ref 6.0–8.3)

## 2014-11-06 LAB — TSH: TSH: 1.82 u[IU]/mL (ref 0.35–4.50)

## 2014-11-06 LAB — SEDIMENTATION RATE: Sed Rate: 29 mm/hr — ABNORMAL HIGH (ref 0–22)

## 2014-11-06 LAB — BRAIN NATRIURETIC PEPTIDE: PRO B NATRI PEPTIDE: 316 pg/mL — AB (ref 0.0–100.0)

## 2014-11-06 NOTE — Progress Notes (Signed)
Subjective:    Patient ID: Trevor Burch, male    DOB: 02/13/1931, MRN: [id number]  HPI  [age] yowm never smoker with D[place]ue [place] in [place] in 1960s but no agent orange exp and tendency to clear throat x "sometime in 2014" p pna mch then felt he felt sniffles and more cough than usual early November assoc wife feeling wasn't himself > primary care 10/29/14 > cxr with small L effusion so referred to pulmonary clinic 11/06/2014 by Dr Trevor Burch   11/06/2014 1st [facility] office visit/ Trevor Burch   Chief Complaint  Patient presents with  . Pulmonary Consult    Referred by Dr. Kateri PlummerMorrow for eval of pleural effusion.  Pt c/o cough for the past month- non prod.  He also c/o occ CP "when i touch my chest".   onset of symptoms correlating with cxr not clear as no acute event and never had fever/ never had nasty mucus / does have CRI Last dental work in June/July 2015 The cp is longstanding and only provoked by touching chest, not by breathing or coughing, and  no h/o assoc rash or known zoster  The cough is more of a throat clearing and is really no different for over a year with a nl cxr 05/19/14 on record.  No obvious day to day or daytime variabilty or assoc  chest tightness, subjective wheeze overt sinus or hb symptoms. No unusual exp hx or h/o childhood pna/ asthma or knowledge of premature birth.  Sleeping ok without nocturnal  or early am exacerbation  of respiratory  c/o's or need for noct saba. Also denies any obvious fluctuation of symptoms with weather or environmental changes or other aggravating or alleviating factors except as outlined above   Current Medications, Allergies, Complete Past Medical History, Past Surgical History, Family History, and Social History were reviewed in Trevor Burch [facility] electronic medical record.        Review of Systems  Constitutional: Negative for fever, chills, activity change, appetite change and unexpected weight change.  HENT: Negative for  congestion, dental problem, postnasal drip, rhinorrhea, sneezing, sore throat, trouble swallowing and voice change.   Eyes: Negative for visual disturbance.  Respiratory: Positive for cough. Negative for choking and shortness of breath.   Cardiovascular: Negative for chest pain and leg swelling.  Gastrointestinal: Negative for nausea, vomiting and abdominal pain.  Genitourinary: Negative for difficulty urinating.  Musculoskeletal: Positive for arthralgias.  Skin: Negative for rash.  Psychiatric/Behavioral: Negative for behavioral problems and confusion.       Objective:   Physical Exam  Stoic amb wm a bit evasive answering questions directly but quite pleasant/occ throat clearing   Wt Readings from Last 3 Encounters:  11/06/14 212 lb (96.163 kg)  04/24/14 209 lb (94.802 kg)  01/21/14 209 lb (94.802 kg)    Vital signs reviewed   HEENT: nl dentition, turbinates, and orophanx. Nl external ear canals without cough reflex   NECK :  without JVD/Nodes/TM/ nl carotid upstrokes bilaterally   LUNGS: no acc muscle use, minimal decrease L base without cough on insp or exp maneuvers   CV:  RRR  no s3 or murmur or increase in P2, no edema   ABD:  soft and nontender with nl excursion in the supine position. No bruits or organomegaly, bowel sounds nl  MS:  warm without deformities, calf tenderness, cyanosis or clubbing  SKIN: warm and dry without lesions    NEURO:  alert, approp, no deficits    cxr 10/30/14  Streaky subsegmental left basilar atelectasis and a small left pleural effusion.     Chemistry      Component Value Date/Time   NA 140 01/12/2014 1445   K 3.8 01/12/2014 1445   CL 106 01/12/2014 1445   CO2 20 01/12/2014 1445   BUN 91* 01/12/2014 1445   CREATININE 4.22* 01/12/2014 1445      Component Value Date/Time   CALCIUM 9.6 01/12/2014 1445   ALKPHOS 67 11/06/2014 1026   AST 18 11/06/2014 1026   ALT 17 11/06/2014 1026   BILITOT 0.5 11/06/2014 1026        Lab Results  Component Value Date   WBC 8.5 01/12/2014   HGB 12.0* 01/12/2014   HCT 35.3* 01/12/2014   MCV 99.4 01/12/2014   PLT 238 01/12/2014    Lab Results  Component Value Date   TSH 1.82 11/06/2014     Lab Results  Component Value Date   PROBNP 316.0* 11/06/2014     Lab Results  Component Value Date   ESRSEDRATE 29* 11/06/2014           Assessment & Plan:

## 2014-11-06 NOTE — Patient Instructions (Addendum)
Please remember to go to the lab  department downstairs for your tests - we will call you with the results when they are available.  GERD (REFLUX)  is an extremely common cause of respiratory symptoms just like yours , many times with no obvious heartburn at all.    It can be treated with medication, but also with lifestyle changes including avoidance of late meals, excessive alcohol, smoking cessation, and avoid fatty foods, chocolate, peppermint, colas, red wine, and acidic juices such as orange juice.  NO MINT OR MENTHOL PRODUCTS SO NO COUGH DROPS  USE SUGARLESS CANDY INSTEAD (Jolley ranchers or Stover's or Life Savers) or even ice chips will also do - the key is to swallow to prevent all throat clearing. NO OIL BASED VITAMINS - use powdered substitutes.    Please schedule a follow up office visit in 4 weeks, sooner if needed with cxr

## 2014-11-07 DIAGNOSIS — R059 Cough, unspecified: Secondary | ICD-10-CM | POA: Insufficient documentation

## 2014-11-07 DIAGNOSIS — R05 Cough: Secondary | ICD-10-CM | POA: Insufficient documentation

## 2014-11-07 NOTE — Progress Notes (Signed)
Quick Note:  LMTCB ______ 

## 2014-11-07 NOTE — Assessment & Plan Note (Addendum)
Assoc with urge to clear throat chronically most likely not related to the effusion at all but rather part of the spectrum of  Classic Upper airway cough syndrome, so named because it's frequently impossible to sort out how much is  CR/sinusitis with freq throat clearing (which can be related to primary GERD)   vs  causing  secondary (" extra esophageal")  GERD from wide swings in gastric pressure that occur with throat clearing, often  promoting self use of mint and menthol lozenges that reduce the lower esophageal sphincter tone and exacerbate the problem further in a cyclical fashion.   These are the same pts (now being labeled as having "irritable larynx syndrome" by some cough centers) who not infrequently have a history of having failed to tolerate ace inhibitors,  dry powder inhalers or biphosphonates or report having atypical reflux symptoms that don't respond to standard doses of PPI , and are easily confused as having aecopd or asthma flares by even experienced allergists/ pulmonologists.  See instructions for specific recommendations which were reviewed directly with the patient who was given a copy with highlighter outlining the key components.   For now rx with gerd diet and continue the dexilant as is  See instructions for specific recommendations which were reviewed directly with the patient who was given a copy with highlighter outlining the key components.

## 2014-11-07 NOTE — Assessment & Plan Note (Addendum)
There is actually very little effusion present (though note in 04/2014 on cxr there was def none at all) and the hx was so difficult I could not pin down exactly when he might have had a pna but this would be the most likely scenario here with small parapneumonic process too mild to warrant more aggressive eval for now    Discussed in detail all the  indications, usual  risks and alternatives  relative to the benefits with patient who agrees to proceed with conservative f/u in a month

## 2014-11-08 ENCOUNTER — Emergency Department (HOSPITAL_COMMUNITY)
Admission: EM | Admit: 2014-11-08 | Discharge: 2014-11-08 | Disposition: A | Discharge status: Home / Self Care | Payer: Medicare Other | Attending: Emergency Medicine | Admitting: Emergency Medicine

## 2014-11-08 ENCOUNTER — Encounter (HOSPITAL_COMMUNITY): Payer: Self-pay | Admitting: Emergency Medicine

## 2014-11-08 DIAGNOSIS — R339 Retention of urine, unspecified: Secondary | ICD-10-CM | POA: Diagnosis not present

## 2014-11-08 DIAGNOSIS — Z8701 Personal history of pneumonia (recurrent): Secondary | ICD-10-CM | POA: Insufficient documentation

## 2014-11-08 DIAGNOSIS — Z8639 Personal history of other endocrine, nutritional and metabolic disease: Secondary | ICD-10-CM | POA: Insufficient documentation

## 2014-11-08 DIAGNOSIS — N189 Chronic kidney disease, unspecified: Secondary | ICD-10-CM | POA: Insufficient documentation

## 2014-11-08 DIAGNOSIS — K219 Gastro-esophageal reflux disease without esophagitis: Secondary | ICD-10-CM | POA: Diagnosis not present

## 2014-11-08 DIAGNOSIS — Z7982 Long term (current) use of aspirin: Secondary | ICD-10-CM | POA: Diagnosis not present

## 2014-11-08 DIAGNOSIS — M1712 Unilateral primary osteoarthritis, left knee: Secondary | ICD-10-CM | POA: Insufficient documentation

## 2014-11-08 DIAGNOSIS — M1711 Unilateral primary osteoarthritis, right knee: Secondary | ICD-10-CM | POA: Diagnosis not present

## 2014-11-08 DIAGNOSIS — I4891 Unspecified atrial fibrillation: Secondary | ICD-10-CM | POA: Insufficient documentation

## 2014-11-08 DIAGNOSIS — R300 Dysuria: Secondary | ICD-10-CM | POA: Diagnosis not present

## 2014-11-08 DIAGNOSIS — I129 Hypertensive chronic kidney disease with stage 1 through stage 4 chronic kidney disease, or unspecified chronic kidney disease: Secondary | ICD-10-CM | POA: Diagnosis not present

## 2014-11-08 DIAGNOSIS — Z79899 Other long term (current) drug therapy: Secondary | ICD-10-CM | POA: Insufficient documentation

## 2014-11-08 DIAGNOSIS — I509 Heart failure, unspecified: Secondary | ICD-10-CM | POA: Diagnosis not present

## 2014-11-08 NOTE — Consult Note (Signed)
Urology Consult  Referring physician: [name], M Reason for referral: Retention  Chief Complaint: Retention  History of Present Illness: Cannot pass catheter x 24 hours; CIC 4 x per day followed by Dr Retta Dionesahlstedt; no distress or UTI symptoms Voids small amounts on his own Modifying factors: There are no other modifying factors  Associated signs and symptoms: There are no other associated signs and symptoms Aggravating and relieving factors: There are no other aggravating or relieving factors Severity: Moderate Duration: Persistent  Past Medical History  Diagnosis Date  . Kidney function abnormal     has approx. 20% function - stablized x several years  . GERD (gastroesophageal reflux disease)   . Hypertension   . Atrial fibrillation   . Community acquired pneumonia   . Hypercholesteremia   . Allergic rhinitis   . Chronic kidney disease     RENAL INSUFFICENCY   . Arthritis     BILATERAL KNEES  . CHF (congestive heart failure)   . Hepatitis     HX OF HEP B   Past Surgical History  Procedure Laterality Date  . Appendectomy    . Tonsillectomy    . Prostate surgery      for enlarged prostate and blockages - no cancer  . Cardioversion N/A 11/01/2013    Procedure: CARDIOVERSION;  Surgeon: Lesleigh NoeHenry W Smith III, MD;  [facility];  Service: Cardiovascular;  Laterality: N/A;    Medications: I have reviewed the patient's current medications. Allergies: No Known Allergies  Family History  Problem Relation Age of Onset  . Heart disease Father    Social History:  reports that he has never smoked. He has never used smokeless tobacco. He reports that he drinks alcohol. He reports that he does not use illicit drugs.  ROS: All systems are reviewed and negative except as noted. Rest negative  Physical Exam:  Vital signs in last 24 hours: Temp:  [97.7 F (36.5 C)] 97.7 F (36.5 C) (12/19 1128) Pulse Rate:  [67-79] 67 (12/19 1158) Resp:  [18] 18 (12/19 1158) BP: (147-160)/(76-97)  147/76 mmHg (12/19 1158) SpO2:  [96 %-98 %] 98 % (12/19 1158)  Cardiovascular: Skin warm; not flushed Respiratory: Breaths quiet; no shortness of breath Abdomen: No masses Neurological: Normal sensation to touch Musculoskeletal: Normal motor function arms and legs Lymphatics: No inguinal adenopathy Skin: No rashes Genitourinary:no blood at meatus; no distress  Laboratory Data:  Results for orders placed or performed in visit on 11/06/14 (from the past 72 hour(s))  Hepatic function panel     Status: Abnormal   Collection Time: 11/06/14 10:26 AM  Result Value Ref Range   Total Bilirubin 0.5 0.2 - 1.2 mg/dL   Bilirubin, Direct 0.1 0.0 - 0.3 mg/dL   Alkaline Phosphatase 67 39 - 117 U/L   AST 18 0 - 37 U/L   ALT 17 0 - 53 U/L   Total Protein 6.6 6.0 - 8.3 g/dL   Albumin 2.9 (L) 3.5 - 5.2 g/dL  Sedimentation rate     Status: Abnormal   Collection Time: 11/06/14 10:26 AM  Result Value Ref Range   Sed Rate 29 (H) 0 - 22 mm/hr  TSH     Status: None   Collection Time: 11/06/14 10:26 AM  Result Value Ref Range   TSH 1.82 0.35 - 4.50 uIU/mL  Brain natriuretic peptide     Status: Abnormal   Collection Time: 11/06/14 10:26 AM  Result Value Ref Range   Pro B Natriuretic peptide (BNP) 316.0 (H)  0.0 - 100.0 pg/mL   No results found for this or any previous visit (from the past 240 hour(s)). Creatinine: No results for input(s): CREATININE in the last 168 hours.  Xrays: See report/chart none  Impression/Assessment:  Retention  Plan:  14 Fr coude went in with some resistance at bladder neck ? BNC vs. False passaage Clear urine sent of c/s See Dr D next week ? cysto  Trevor Burch A 11/08/2014, 1:17 PM

## 2014-11-08 NOTE — ED Notes (Signed)
Urology finished at bedside; inserted #16 coude catheter. Draining well.

## 2014-11-08 NOTE — ED Notes (Signed)
Pt. Stated, I have to catheterize myself and the catheter will not pass through.  I had a surgery that didn't work 5 years.

## 2014-11-08 NOTE — ED Provider Notes (Signed)
CSN: [id number]     Arrival date & time 11/08/14  1123 History   First MD Initiated Contact with Patient 11/08/14 1143     Chief Complaint  Patient presents with  . Urinary Retention      HPI    Past Medical History  Diagnosis Date  . Kidney function abnormal     has approx. 20% function - stablized x several years  . GERD (gastroesophageal reflux disease)   . Hypertension   . Atrial fibrillation   . Community acquired pneumonia   . Hypercholesteremia   . Allergic rhinitis   . Chronic kidney disease     RENAL INSUFFICENCY   . Arthritis     BILATERAL KNEES  . CHF (congestive heart failure)   . Hepatitis     HX OF HEP B   Past Surgical History  Procedure Laterality Date  . Appendectomy    . Tonsillectomy    . Prostate surgery      for enlarged prostate and blockages - no cancer  . Cardioversion N/A 11/01/2013    Procedure: CARDIOVERSION;  Surgeon: Lesleigh NoeHenry W Smith III, MD;  [facility] OR;  Service: Cardiovascular;  Laterality: N/A;   Family History  Problem Relation Age of Onset  . Heart disease Father    History  Substance Use Topics  . Smoking status: Never Smoker   . Smokeless tobacco: Never Used  . Alcohol Use: Yes     Comment: ocassionally    Review of Systems  Constitutional: Negative for fever, chills, diaphoresis, appetite change and fatigue.  HENT: Negative for mouth sores, sore throat and trouble swallowing.   Eyes: Negative for visual disturbance.  Respiratory: Negative for cough, chest tightness, shortness of breath and wheezing.   Cardiovascular: Negative for chest pain.  Gastrointestinal: Negative for nausea, vomiting, abdominal pain, diarrhea and abdominal distention.  Endocrine: Negative for polydipsia, polyphagia and polyuria.  Genitourinary: Positive for decreased urine volume and difficulty urinating. Negative for dysuria, frequency and hematuria.  Musculoskeletal: Negative for gait problem.  Skin: Negative for color change, pallor and  rash.  Neurological: Negative for dizziness, syncope, light-headedness and headaches.  Hematological: Does not bruise/bleed easily.  Psychiatric/Behavioral: Negative for behavioral problems and confusion.      Allergies  Review of patient's allergies indicates no known allergies.  Home Medications   Prior to Admission medications   Medication Sig Start Date End Date Taking? Authorizing Provider  acetaminophen (TYLENOL) 325 MG tablet Take 650 mg by mouth every 6 (six) hours as needed.    Historical Provider, MD  amiodarone (PACERONE) 200 MG tablet Take 200 mg by mouth daily.    Historical Provider, MD  amLODipine (NORVASC) 5 MG tablet Take 5 mg by mouth 2 (two) times daily.    Historical Provider, MD  aspirin EC 81 MG tablet Take 1 tablet (81 mg total) by mouth daily. 04/24/14   Lyn RecordsHenry W Smith III, MD  brimonidine (ALPHAGAN) 0.2 % ophthalmic solution Place 1 drop into the left eye 2 (two) times daily.    Historical Provider, MD  CHOLINE PO Take 300 mg by mouth daily.    Historical Provider, MD  dexlansoprazole (DEXILANT) 60 MG capsule Take 60 mg by mouth daily.    Historical Provider, MD  diclofenac sodium (VOLTAREN) 1 % GEL Apply 2 g topically 3 (three) times daily as needed (inflammation/pain).    Historical Provider, MD  Dorzolamide HCl-Timolol Mal PF 22.3-6.8 MG/ML SOLN Place 1 drop into the left eye 2 (two) times daily.  Historical Provider, MD  furosemide (LASIX) 80 MG tablet Take 80 mg by mouth 2 (two) times daily.  11/29/13   Beatrice LecherScott T Weaver, PA-C  Multiple Vitamin (MULTIVITAMIN WITH MINERALS) TABS tablet Take 1 tablet by mouth daily.    Historical Provider, MD  Omega-3 Fatty Acids (FISH OIL) 1000 MG CAPS Take 1 capsule by mouth daily.    Historical Provider, MD  traMADol (ULTRAM) 50 MG tablet Take 50 mg by mouth every 6 (six) hours as needed for moderate pain.    Historical Provider, MD   BP 161/79 mmHg  Pulse 71  Temp(Src) 97.7 F (36.5 C)  Resp 18  SpO2 96% Physical Exam    Constitutional: He is oriented to person, place, and time. He appears well-developed and well-nourished. No distress.  HENT:  Head: Normocephalic.  Eyes: Conjunctivae are normal. Pupils are equal, round, and reactive to light. No scleral icterus.  Neck: Normal range of motion. Neck supple. No thyromegaly present.  Cardiovascular: Normal rate and regular rhythm.  Exam reveals no gallop and no friction rub.   No murmur heard. Pulmonary/Chest: Effort normal and breath sounds normal. No respiratory distress. He has no wheezes. He has no rales.  Abdominal: Soft. Bowel sounds are normal. He exhibits no distension. There is no tenderness. There is no rebound.  Genitourinary:     Musculoskeletal: Normal range of motion.  Neurological: He is alert and oriented to person, place, and time.  Skin: Skin is warm and dry. No rash noted.  Psychiatric: He has a normal mood and affect. His behavior is normal.    ED Course  Procedures (including critical care time) Labs Review Labs Reviewed  URINE CULTURE    Imaging Review No results found.   EKG Interpretation None      MDM   Final diagnoses:  Urinary retention   Attempts 2 to pass catheter by myself including 16 French silicone, and 16 French coud tip. Both unsuccessful. No passage of urine. Not able to fully past length of catheter. No bladder trauma noted. I discussed the case with Dr. Jacquelyne BalintMcDermott who is en route.  Catheter placed successfully by Dr. Jacquelyne BalintMcDermott. Patient drains over 1200 mL of urine. Has resolution of symptoms.  Patient to call for an appointment on Monday, for 1 week from Monday. Culture pending.    Rolland PorterMark Sidonie Dexheimer, MD 11/08/14 [phone]

## 2014-11-08 NOTE — ED Notes (Signed)
Changed foley bag to leg bag for discharge. Patient given instructions on care.

## 2014-11-08 NOTE — Discharge Instructions (Signed)
Call your urologist on Monday, for an appointment a week from Monday.  Acute Urinary Retention Acute urinary retention is the temporary inability to urinate. This is a common problem in older men. As men age their prostates become larger and block the flow of urine from the bladder. This is usually a problem that has come on gradually.  HOME CARE INSTRUCTIONS If you are sent home with a Foley catheter and a drainage system, you will need to discuss the best course of action with your health care provider. While the catheter is in, maintain a good intake of fluids. Keep the drainage bag emptied and lower than your catheter. This is so that contaminated urine will not flow back into your bladder, which could lead to a urinary tract infection. There are two main types of drainage bags. One is a large bag that usually is used at night. It has a good capacity that will allow you to sleep through the night without having to empty it. The second type is called a leg bag. It has a smaller capacity, so it needs to be emptied more frequently. However, the main advantage is that it can be attached by a leg strap and can go underneath your clothing, allowing you the freedom to move about or leave your home. Only take over-the-counter or prescription medicines for pain, discomfort, or fever as directed by your health care provider.  SEEK MEDICAL CARE IF:  You develop a low-grade fever.  You experience spasms or leakage of urine with the spasms. SEEK IMMEDIATE MEDICAL CARE IF:   You develop chills or fever.  Your catheter stops draining urine.  Your catheter falls out.  You start to develop increased bleeding that does not respond to rest and increased fluid intake. MAKE SURE YOU:  Understand these instructions.  Will watch your condition.  Will get help right away if you are not doing well or get worse. Document Released: 02/13/2001 Document Revised: 11/12/2013 Document Reviewed:  04/18/2013 [facility] Patient Information 2015 [employer] [place]. This information is not intended to replace advice given to you by your health care provider. Make sure you discuss any questions you have with your health care provider.

## 2014-11-11 LAB — URINE CULTURE
Colony Count: >=100000
Special Requests: NORMAL

## 2014-11-11 NOTE — Progress Notes (Signed)
Quick Note:  Spoke with pt and notified of results per Dr. Wert. Pt verbalized understanding and denied any questions.  ______ 

## 2014-11-12 ENCOUNTER — Telehealth (HOSPITAL_BASED_OUTPATIENT_CLINIC_OR_DEPARTMENT_OTHER): Payer: Self-pay | Admitting: Emergency Medicine

## 2014-11-12 ENCOUNTER — Telehealth (HOSPITAL_BASED_OUTPATIENT_CLINIC_OR_DEPARTMENT_OTHER): Payer: Self-pay | Admitting: *Deleted

## 2014-11-12 NOTE — Progress Notes (Signed)
ED Antimicrobial Stewardship Positive Culture Follow Up   Trevor Burch is an 80 y.o. male [facility] with a chief complaint of  Chief Complaint  Patient presents with  . Urinary Retention    Recent Results (from the past 720 hour(s))  Urine culture     Status: None   Collection Time: 11/08/14  1:42 PM  Result Value Ref Range Status   Specimen Description URINE, CATHETERIZED  Final   Special Requests Normal  Final   Culture  Setup Time   Final    11/08/2014 14:12 Performed at [facility]    Colony Count   Final    >=100,000 COLONIES/ML [facility]formed at [facility]    Culture   Final    ESCHERICHIA COLI Performed at [facility]    Report Status 11/11/2014 FINAL  Final   Organism ID, Bacteria ESCHERICHIA COLI  Final      Susceptibility   Escherichia coli - MIC*    AMPICILLIN >=32 RESISTANT Resistant     CEFAZOLIN >=64 RESISTANT Resistant     CEFTRIAXONE 4 SENSITIVE Sensitive     CIPROFLOXACIN <=0.25 SENSITIVE Sensitive     GENTAMICIN <=1 SENSITIVE Sensitive     LEVOFLOXACIN <=0.12 SENSITIVE Sensitive     NITROFURANTOIN <=16 SENSITIVE Sensitive     TOBRAMYCIN <=1 SENSITIVE Sensitive     TRIMETH/SULFA <=20 SENSITIVE Sensitive     PIP/TAZO >=128 RESISTANT Resistant     * ESCHERICHIA COLI    []  Treated with , organism resistant to prescribed antimicrobial [x]  Patient discharged originally without antimicrobial agent and treatment is now indicated - CrCl ~17 ml/min  New antibiotic prescription: Ciprofloxacin 500mg  PO daily x 7 days  ED Provider: Oswaldo ConroyVictoria Creech, PA-C   Cleon DewDulaney, [place] 11/12/2014, 10:02 AM Infectious Diseases Pharmacist Phone# [phone]

## 2014-12-01 ENCOUNTER — Ambulatory Visit (INDEPENDENT_AMBULATORY_CARE_PROVIDER_SITE_OTHER): Payer: Medicare Other | Admitting: Interventional Cardiology

## 2014-12-01 ENCOUNTER — Encounter: Payer: Self-pay | Admitting: Interventional Cardiology

## 2014-12-01 VITALS — BP 118/64 | HR 66 | Ht 73.0 in | Wt 206.0 lb

## 2014-12-01 DIAGNOSIS — I5032 Chronic diastolic (congestive) heart failure: Secondary | ICD-10-CM

## 2014-12-01 DIAGNOSIS — Z79899 Other long term (current) drug therapy: Secondary | ICD-10-CM

## 2014-12-01 DIAGNOSIS — I10 Essential (primary) hypertension: Secondary | ICD-10-CM

## 2014-12-01 DIAGNOSIS — N184 Chronic kidney disease, stage 4 (severe): Secondary | ICD-10-CM

## 2014-12-01 LAB — TSH: TSH: 1.26 u[IU]/mL (ref 0.35–4.50)

## 2014-12-01 LAB — HEPATIC FUNCTION PANEL
ALBUMIN: 2.9 g/dL — AB (ref 3.5–5.2)
ALT: 16 U/L (ref 0–53)
AST: 18 U/L (ref 0–37)
Alkaline Phosphatase: 76 U/L (ref 39–117)
BILIRUBIN DIRECT: 0 mg/dL (ref 0.0–0.3)
TOTAL PROTEIN: 7 g/dL (ref 6.0–8.3)
Total Bilirubin: 0.6 mg/dL (ref 0.2–1.2)

## 2014-12-01 NOTE — Patient Instructions (Signed)
Your physician recommends that you continue on your current medications as directed. Please refer to the Current Medication list given to you today.  Lab Today: Tsh, Hepatic  Your physician wants you to follow-up in: 6 months with Dr.Smith You will receive a reminder letter in the mail two months in advance. If you don't receive a letter, please call our office to schedule the follow-up appointment.  

## 2014-12-01 NOTE — Progress Notes (Signed)
Patient ID: Trevor Burch, male   DOB: 02-Nov-1931, 81 y.o.   MRN: [id number]    [address] [place], [place]  [address] Phone: [phone] Fax:  [phone]  Date:  12/01/2014   ID:  Trevor Burch, DOB 1931/09/19, MRN [id number]  PCP:  Farris Has, MD   ASSESSMENT:  1. Paroxysmal atrial fibrillation, with rhythm control on amiodarone 2. Amiodarone therapy 3. Chronic diastolic heart failure, stable 4. Stage IV chronic kidney disease  PLAN:  1. Continue amiodarone 200 mg daily 2. Hepatic panel and TSH 3. Clinical follow-up in 6 months with hepatic panel and TSH   SUBJECTIVE: Trevor Burch is a 81 y.o. male who is doing well. He has not had syncope. Denies palpitations. He has one pillow orthopnea. No peripheral edema. Kidney function is been stable.   Wt Readings from Last 3 Encounters:  12/01/14 206 lb (93.441 kg)  11/06/14 212 lb (96.163 kg)  04/24/14 209 lb (94.802 kg)     Past Medical History  Diagnosis Date  . Kidney function abnormal     has approx. 20% function - stablized x several years  . GERD (gastroesophageal reflux disease)   . Hypertension   . Atrial fibrillation   . Community acquired pneumonia   . Hypercholesteremia   . Allergic rhinitis   . Chronic kidney disease     RENAL INSUFFICENCY   . Arthritis     BILATERAL KNEES  . CHF (congestive heart failure)   . Hepatitis     HX OF HEP B    Current Outpatient Prescriptions  Medication Sig Dispense Refill  . acetaminophen (TYLENOL) 325 MG tablet Take 650 mg by mouth every 6 (six) hours as needed.    Marland Kitchen amiodarone (PACERONE) 200 MG tablet Take 200 mg by mouth daily.    Marland Kitchen amLODipine (NORVASC) 5 MG tablet Take 5 mg by mouth 2 (two) times daily.    Marland Kitchen aspirin EC 81 MG tablet Take 1 tablet (81 mg total) by mouth daily.    . brimonidine (ALPHAGAN) 0.2 % ophthalmic solution Place 1 drop into the left eye 2 (two) times daily.    . cephALEXin (KEFLEX) 250 MG capsule Take 250 mg by mouth  daily.    . CHOLINE PO Take 300 mg by mouth daily.    Marland Kitchen dexlansoprazole (DEXILANT) 60 MG capsule Take 60 mg by mouth daily.    . diclofenac sodium (VOLTAREN) 1 % GEL Apply 2 g topically 3 (three) times daily as needed (inflammation/pain).    . Dorzolamide HCl-Timolol Mal PF 22.3-6.8 MG/ML SOLN Place 1 drop into the left eye 2 (two) times daily.    . furosemide (LASIX) 80 MG tablet Take 80 mg by mouth 2 (two) times daily.     Marland Kitchen ketorolac (ACULAR) 0.4 % SOLN 1 drop 2 (two) times daily. Left eye.    . LUTEIN PO Take 2 mg by mouth daily.    . Menthol, Topical Analgesic, (RA PAIN CARE MUSCLE & JOINT EX) Apply topically.    . Multiple Vitamin (MULTIVITAMIN WITH MINERALS) TABS tablet Take 1 tablet by mouth daily.    . Omega-3 Fatty Acids (FISH OIL) 1000 MG CAPS Take 1 capsule by mouth daily.    . traMADol (ULTRAM) 50 MG tablet Take 50 mg by mouth every 6 (six) hours as needed for moderate pain.     No current facility-administered medications for this visit.    Allergies:   No Known Allergies  Social  History:  The patient  reports that he has never smoked. He has never used smokeless tobacco. He reports that he drinks alcohol. He reports that he does not use illicit drugs.   ROS:  Please see the history of present illness.   Appetite is been stable. No blood in the urine or stool. No transient neurological complaints.   All other systems reviewed and negative.   OBJECTIVE: VS:  BP 118/64 mmHg  Pulse 66  Ht 6\' 1"  (1.854 m)  Wt 206 lb (93.441 kg)  BMI 27.18 kg/m2 Well nourished, well developed, in no acute distress, elderly appearing HEENT: normal Neck: JVD flat. Carotid bruit absent  Cardiac:  normal S1, S2; RRR; no murmur Lungs:  clear to auscultation bilaterally, no wheezing, rhonchi or rales Abd: soft, nontender, no hepatomegaly Ext: Edema trace bilateral. Pulses 2+ bilateral Skin: warm and dry Neuro:  CNs 2-12 intact, no focal abnormalities noted  EKG:  Normal sinus rhythm with  first-degree AV block and left ventricular hypertrophy.       Signed, Darci NeedleHenry W. B. Smith III, MD 12/01/2014 12:11 PM

## 2014-12-03 ENCOUNTER — Ambulatory Visit (INDEPENDENT_AMBULATORY_CARE_PROVIDER_SITE_OTHER): Payer: Medicare Other | Admitting: Internal Medicine

## 2014-12-03 ENCOUNTER — Telehealth: Payer: Self-pay

## 2014-12-03 ENCOUNTER — Ambulatory Visit (INDEPENDENT_AMBULATORY_CARE_PROVIDER_SITE_OTHER)
Admission: RE | Admit: 2014-12-03 | Discharge: 2014-12-03 | Disposition: A | Discharge status: Home / Self Care | Payer: Medicare Other | Source: Ambulatory Visit | Attending: Internal Medicine | Admitting: Internal Medicine

## 2014-12-03 DIAGNOSIS — J90 Pleural effusion, not elsewhere classified: Secondary | ICD-10-CM

## 2014-12-03 NOTE — Patient Instructions (Signed)
Please schedule a follow up office visit in 6 weeks, call sooner if needed with cxr on return  

## 2014-12-03 NOTE — Telephone Encounter (Signed)
-----   Message from Henry W Smith III, MD sent at 12/02/2014  6:09 PM EST ----- Liver and thyroid are doing okay. 

## 2014-12-03 NOTE — Telephone Encounter (Signed)
called to give pt lab results.lmtcb 

## 2014-12-03 NOTE — Progress Notes (Signed)
Subjective:    Patient ID: Trevor Burch, male    DOB: 1931/11/17, MRN: [id number]    Brief patient profile:  15 yowm never smoker with Dengue fever in [place] in 1960s but no agent orange exp and tendency to clear throat x "sometime in 2014" p pna mch then felt he felt sniffles and more cough than usual early November assoc wife feeling wasn't himself > primary care 10/29/14 > cxr with small L effusion so referred to pulmonary clinic 11/06/2014 by Dr Nyoka Cowden  History of Present Illness  11/06/2014 1st [facility] office visit/ Bijou Easler   Chief Complaint  Patient presents with  . Pulmonary Consult    Referred by Dr. Kateri Plummer for eval of pleural effusion.  Pt c/o cough for the past month- non prod.  He also c/o occ CP "when i touch my chest".   onset of symptoms correlating with cxr not clear as no acute event and never had fever/ never had nasty mucus / does have CRI Last dental work in June/July 2015 The cp is longstanding and only provoked by touching chest, not by breathing or coughing, and  no h/o assoc rash or known zoster  The cough is more of a throat clearing and is really no different for over a year with a nl cxr 05/19/14 on record. rec GERD diet    12/03/2014 f/u ov/Davide Risdon re: chronic cough/ L pl effusion ? Significant  Chief Complaint  Patient presents with  . Follow-up    head cold     Still using fish oil Cough is day > noct No cp at all now Not limited by breathing from desired activities  But very sedentary, able to lie flat   No obvious day to day or daytime variabilty or assoc chronic cough or cp or chest tightness, subjective wheeze overt sinus or hb symptoms. No unusual exp hx or h/o childhood pna/ asthma or knowledge of premature birth.  Sleeping ok without nocturnal  or early am exacerbation  of respiratory  c/o's or need for noct saba. Also denies any obvious fluctuation of symptoms with weather or environmental changes or other aggravating or alleviating  factors except as outlined above   Current Medications, Allergies, Complete Past Medical History, Past Surgical History, Family History, and Social History were reviewed in [employer] record.  ROS  The following are not active complaints unless bolded sore throat, dysphagia, dental problems, itching, sneezing,  nasal congestion or excess/ purulent secretions, ear ache,   fever, chills, sweats, unintended wt loss, pleuritic or exertional cp, hemoptysis,  orthopnea pnd or leg swelling, presyncope, palpitations, heartburn, abdominal pain, anorexia, nausea, vomiting, diarrhea  or change in bowel or urinary habits, change in stools or urine, dysuria,hematuria,  rash, arthralgias, visual complaints, headache, numbness weakness or ataxia or problems with walking or coordination,  change in mood/affect or memory.              .       Objective:   Physical Exam  amb wm extremely hoh  12/03/14            205  Wt Readings from Last 3 Encounters:  11/06/14 212 lb (96.163 kg)  04/24/14 209 lb (94.802 kg)  01/21/14 209 lb (94.802 kg)    Vital signs reviewed   HEENT: nl dentition, turbinates, and orophanx. Nl external ear canals without cough reflex   NECK :  without JVD/Nodes/TM/ nl carotid upstrokes bilaterally   LUNGS: no acc muscle  use, minimal decrease L base without cough on insp or exp maneuvers   CV:  RRR  no s3 or murmur or increase in P2, no edema   ABD:  soft and nontender with nl excursion in the supine position. No bruits or organomegaly, bowel sounds nl  MS:  warm without deformities, calf tenderness, cyanosis or clubbing  SKIN: warm and dry without lesions        CXR:  12/03/14  I personally reviewed images and agree with radiology impression as follows:   Marland Kitchen. Slight progression of left base infiltrate. 2. Calcified pulmonary nodule left mid lung. 3. Stable cardiomegaly.     Chemistry      Component Value Date/Time   NA 140 01/12/2014  1445   K 3.8 01/12/2014 1445   CL 106 01/12/2014 1445   CO2 20 01/12/2014 1445   BUN 91* 01/12/2014 1445   CREATININE 4.22* 01/12/2014 1445      Component Value Date/Time   CALCIUM 9.6 01/12/2014 1445   ALKPHOS 67 11/06/2014 1026   AST 18 11/06/2014 1026   ALT 17 11/06/2014 1026   BILITOT 0.5 11/06/2014 1026       Lab Results  Component Value Date   WBC 8.5 01/12/2014   HGB 12.0* 01/12/2014   HCT 35.3* 01/12/2014   MCV 99.4 01/12/2014   PLT 238 01/12/2014    Lab Results  Component Value Date   TSH 1.82 11/06/2014     Lab Results  Component Value Date   PROBNP 316.0* 11/06/2014     Lab Results  Component Value Date   ESRSEDRATE 29* 11/06/2014           Assessment & Plan:

## 2014-12-03 NOTE — Telephone Encounter (Signed)
Pt wife aware of lab results.Liver and thyroid are doing okay.

## 2014-12-03 NOTE — Telephone Encounter (Signed)
-----   Message from Lesleigh NoeHenry W Smith III, MD sent at 12/02/2014  6:09 PM EST ----- Liver and thyroid are doing okay.

## 2014-12-03 NOTE — Telephone Encounter (Signed)
F/U         Pt returning call about lab results. Please return call.

## 2014-12-04 ENCOUNTER — Encounter (HOSPITAL_COMMUNITY): Payer: Self-pay | Admitting: Interventional Cardiology

## 2014-12-07 ENCOUNTER — Encounter: Payer: Self-pay | Admitting: Internal Medicine

## 2014-12-07 NOTE — Assessment & Plan Note (Signed)
I had an extended discussion with the patient reviewing all relevant studies completed to date and  lasting 15 to 20 minutes of a 25 minute visit on the following ongoing concerns:  1) the effusion is really quite small and likely late parapneumonic and can't be sure it's causing any specific symptoms but we don't have a specific dx as yet  2) too small to tap safely  3) best option is serial f/u unless symptoms progress, reviewed  See instructions for specific recommendations which were reviewed directly with the patient who was given a copy with highlighter outlining the key components.

## 2015-01-09 ENCOUNTER — Ambulatory Visit: Payer: Medicare Other | Admitting: Internal Medicine

## 2015-01-11 ENCOUNTER — Encounter (HOSPITAL_COMMUNITY): Payer: Self-pay | Admitting: Emergency Medicine

## 2015-01-11 ENCOUNTER — Emergency Department (HOSPITAL_COMMUNITY)
Admission: EM | Admit: 2015-01-11 | Discharge: 2015-01-12 | Disposition: A | Discharge status: Home / Self Care | Payer: Medicare Other | Attending: Emergency Medicine | Admitting: Emergency Medicine

## 2015-01-11 DIAGNOSIS — Z8709 Personal history of other diseases of the respiratory system: Secondary | ICD-10-CM | POA: Insufficient documentation

## 2015-01-11 DIAGNOSIS — S51001A Unspecified open wound of right elbow, initial encounter: Secondary | ICD-10-CM | POA: Insufficient documentation

## 2015-01-11 DIAGNOSIS — Y92009 Unspecified place in unspecified non-institutional (private) residence as the place of occurrence of the external cause: Secondary | ICD-10-CM | POA: Insufficient documentation

## 2015-01-11 DIAGNOSIS — W19XXXA Unspecified fall, initial encounter: Secondary | ICD-10-CM

## 2015-01-11 DIAGNOSIS — N189 Chronic kidney disease, unspecified: Secondary | ICD-10-CM | POA: Diagnosis not present

## 2015-01-11 DIAGNOSIS — K219 Gastro-esophageal reflux disease without esophagitis: Secondary | ICD-10-CM | POA: Insufficient documentation

## 2015-01-11 DIAGNOSIS — M170 Bilateral primary osteoarthritis of knee: Secondary | ICD-10-CM | POA: Diagnosis not present

## 2015-01-11 DIAGNOSIS — W010XXA Fall on same level from slipping, tripping and stumbling without subsequent striking against object, initial encounter: Secondary | ICD-10-CM | POA: Diagnosis not present

## 2015-01-11 DIAGNOSIS — Y9389 Activity, other specified: Secondary | ICD-10-CM | POA: Diagnosis not present

## 2015-01-11 DIAGNOSIS — I4891 Unspecified atrial fibrillation: Secondary | ICD-10-CM | POA: Diagnosis not present

## 2015-01-11 DIAGNOSIS — Z79899 Other long term (current) drug therapy: Secondary | ICD-10-CM | POA: Diagnosis not present

## 2015-01-11 DIAGNOSIS — Y998 Other external cause status: Secondary | ICD-10-CM | POA: Diagnosis not present

## 2015-01-11 DIAGNOSIS — L03116 Cellulitis of left lower limb: Secondary | ICD-10-CM | POA: Diagnosis not present

## 2015-01-11 DIAGNOSIS — Z8701 Personal history of pneumonia (recurrent): Secondary | ICD-10-CM | POA: Diagnosis not present

## 2015-01-11 DIAGNOSIS — I129 Hypertensive chronic kidney disease with stage 1 through stage 4 chronic kidney disease, or unspecified chronic kidney disease: Secondary | ICD-10-CM | POA: Diagnosis not present

## 2015-01-11 DIAGNOSIS — Z8619 Personal history of other infectious and parasitic diseases: Secondary | ICD-10-CM | POA: Diagnosis not present

## 2015-01-11 DIAGNOSIS — Z7982 Long term (current) use of aspirin: Secondary | ICD-10-CM | POA: Diagnosis not present

## 2015-01-11 DIAGNOSIS — Z8639 Personal history of other endocrine, nutritional and metabolic disease: Secondary | ICD-10-CM | POA: Insufficient documentation

## 2015-01-11 DIAGNOSIS — Z791 Long term (current) use of non-steroidal anti-inflammatories (NSAID): Secondary | ICD-10-CM | POA: Diagnosis not present

## 2015-01-11 DIAGNOSIS — S59901A Unspecified injury of right elbow, initial encounter: Secondary | ICD-10-CM | POA: Diagnosis present

## 2015-01-11 DIAGNOSIS — S51011A Laceration without foreign body of right elbow, initial encounter: Secondary | ICD-10-CM

## 2015-01-11 DIAGNOSIS — I509 Heart failure, unspecified: Secondary | ICD-10-CM | POA: Diagnosis not present

## 2015-01-11 LAB — CBC WITH DIFFERENTIAL/PLATELET
Basophils Absolute: 0 10*3/uL (ref 0.0–0.1)
Basophils Relative: 0 % (ref 0–1)
EOS ABS: 0.3 10*3/uL (ref 0.0–0.7)
Eosinophils Relative: 3 % (ref 0–5)
HEMATOCRIT: 31 % — AB (ref 39.0–52.0)
Hemoglobin: 10.3 g/dL — ABNORMAL LOW (ref 13.0–17.0)
Lymphocytes Relative: 22 % (ref 12–46)
Lymphs Abs: 2 10*3/uL (ref 0.7–4.0)
MCH: 32.6 pg (ref 26.0–34.0)
MCHC: 33.2 g/dL (ref 30.0–36.0)
MCV: 98.1 fL (ref 78.0–100.0)
MONOS PCT: 10 % (ref 3–12)
Monocytes Absolute: 0.9 10*3/uL (ref 0.1–1.0)
Neutro Abs: 5.9 10*3/uL (ref 1.7–7.7)
Neutrophils Relative %: 65 % (ref 43–77)
Platelets: 272 10*3/uL (ref 150–400)
RBC: 3.16 MIL/uL — ABNORMAL LOW (ref 4.22–5.81)
RDW: 14.1 % (ref 11.5–15.5)
WBC: 9 10*3/uL (ref 4.0–10.5)

## 2015-01-11 MED ORDER — CLINDAMYCIN HCL 300 MG PO CAPS
300.0000 mg | ORAL_CAPSULE | Freq: Once | ORAL | Status: AC
  Administered 2015-01-12: 300 mg via ORAL
  Filled 2015-01-11: qty 1

## 2015-01-11 NOTE — ED Provider Notes (Signed)
CSN: [id number]     Arrival date & time 01/11/15  2140 History  This chart was scribed for Loren Racer, MD by Luisa Dago, ED Scribe. This patient was seen in room D32C/D32C and the patient's care was started at 11:17 PM.    Chief Complaint  Patient presents with  . Fall  . Extremity Laceration  . Cellulitis   The history is provided by the patient and medical records. No language interpreter was used.   HPI Comments: Trevor Burch is a 81 y.o. male with a PMhx of afib, CHF and chronic kidney disease presents to the Emergency Department complaining of a fall that occurred 2 hours ago. Pt states that Trevor Burch was carrying two dog dishes when Trevor Burch tripped on a dog toy and fell on a marble floor. Trevor Burch now has a skin tear to his right arm. Pt denies any LOC or head trauma. Pt is also here to have swelling and redness of his lower left extremity evaluated. Wife states that his symptoms started out as a small boil to the back of his leg which they applied warm compresses to and it drained. Now there is associated warmth to the affected area. Trevor Burch does endorse being a bit unstable lately and more prone to falls. Pt denies fever, neck pain, sore throat, visual disturbance, CP, cough, SOB, abdominal pain, nausea, emesis, diarrhea, urinary symptoms, back pain, HA, weakness, numbness and rash as associated symptoms.     Past Medical History  Diagnosis Date  . Kidney function abnormal     has approx. 20% function - stablized x several years  . GERD (gastroesophageal reflux disease)   . Hypertension   . Atrial fibrillation   . Community acquired pneumonia   . Hypercholesteremia   . Allergic rhinitis   . Chronic kidney disease     RENAL INSUFFICENCY   . Arthritis     BILATERAL KNEES  . CHF (congestive heart failure)   . Hepatitis     HX OF HEP B   Past Surgical History  Procedure Laterality Date  . Appendectomy    . Tonsillectomy    . Prostate surgery      for enlarged prostate and  blockages - no cancer  . Cardioversion N/A 11/01/2013    Procedure: CARDIOVERSION;  Surgeon: Lesleigh Noe, MD;  Location: [facility] OR;  Service: Cardiovascular;  Laterality: N/A;   Family History  Problem Relation Age of Onset  . Heart disease Father    History  Substance Use Topics  . Smoking status: Never Smoker   . Smokeless tobacco: Never Used  . Alcohol Use: Yes     Comment: ocassionally    Review of Systems  Constitutional: Negative for fever and chills.  Respiratory: Negative for cough and shortness of breath.   Cardiovascular: Positive for leg swelling. Negative for chest pain.  Gastrointestinal: Negative for nausea, vomiting and abdominal pain.  Musculoskeletal: Positive for arthralgias. Negative for joint swelling and neck pain.  Skin: Positive for color change and wound.  Neurological: Negative for dizziness, syncope, weakness and numbness.  All other systems reviewed and are negative.     Allergies  Review of patient's allergies indicates no known allergies.  Home Medications   Prior to Admission medications   Medication Sig Start Date End Date Taking? Authorizing Provider  acetaminophen (TYLENOL) 325 MG tablet Take 650 mg by mouth every 6 (six) hours as needed.    Historical Provider, MD  amiodarone (PACERONE) 200 MG tablet Take  200 mg by mouth daily.    Historical Provider, MD  amLODipine (NORVASC) 5 MG tablet Take 5 mg by mouth 2 (two) times daily.    Historical Provider, MD  aspirin EC 81 MG tablet Take 1 tablet (81 mg total) by mouth daily. 04/24/14   Lyn RecordsHenry W Smith III, MD  brimonidine (ALPHAGAN) 0.2 % ophthalmic solution Place 1 drop into the left eye 2 (two) times daily.    Historical Provider, MD  cephALEXin (KEFLEX) 250 MG capsule Take 250 mg by mouth daily.    Historical Provider, MD  CHOLINE PO Take 300 mg by mouth daily.    Historical Provider, MD  clindamycin (CLEOCIN) 300 MG capsule Take 1 capsule (300 mg total) by mouth 4 (four) times daily. X 7 days  01/12/15   Loren Raceravid Cecily Lawhorne, MD  dexlansoprazole (DEXILANT) 60 MG capsule Take 60 mg by mouth daily.    Historical Provider, MD  diclofenac sodium (VOLTAREN) 1 % GEL Apply 2 g topically 3 (three) times daily as needed (inflammation/pain).    Historical Provider, MD  Dorzolamide HCl-Timolol Mal PF 22.3-6.8 MG/ML SOLN Place 1 drop into the left eye 2 (two) times daily.    Historical Provider, MD  furosemide (LASIX) 80 MG tablet Take 80 mg by mouth 2 (two) times daily.  11/29/13   Beatrice LecherScott T Weaver, PA-C  ketorolac (ACULAR) 0.4 % SOLN 1 drop 2 (two) times daily. Left eye.    Historical Provider, MD  LUTEIN PO Take 2 mg by mouth daily.    Historical Provider, MD  Menthol, Topical Analgesic, (RA PAIN CARE MUSCLE & JOINT EX) Apply topically.    Historical Provider, MD  Multiple Vitamin (MULTIVITAMIN WITH MINERALS) TABS tablet Take 1 tablet by mouth daily.    Historical Provider, MD  Omega-3 Fatty Acids (FISH OIL) 1000 MG CAPS Take 1 capsule by mouth daily.    Historical Provider, MD  traMADol (ULTRAM) 50 MG tablet Take 50 mg by mouth every 6 (six) hours as needed for moderate pain.    Historical Provider, MD   BP 141/65 mmHg  Pulse 76  Temp(Src) 98.2 F (36.8 C) (Oral)  Resp 17  SpO2 96%  Physical Exam  Constitutional: Trevor Burch is oriented to person, place, and time. Trevor Burch appears well-developed and well-nourished. No distress.  HENT:  Head: Normocephalic and atraumatic.  Mouth/Throat: Oropharynx is clear and moist.  Eyes: EOM are normal. Pupils are equal, round, and reactive to light.  Neck: Normal range of motion. Neck supple.  No posterior midline cervical tenderness to palpation.  Cardiovascular: Normal rate and regular rhythm.   Pulmonary/Chest: Effort normal and breath sounds normal. No respiratory distress. Trevor Burch has no wheezes. Trevor Burch has no rales.  Abdominal: Soft. Bowel sounds are normal. Trevor Burch exhibits no distension and no mass. There is no tenderness. There is no rebound and no guarding.  Musculoskeletal:  Normal range of motion. Trevor Burch exhibits edema. Trevor Burch exhibits no tenderness.  Patient with skin tear to the right elbow is roughly 6 cm in diameter.  Edema noted to the left lower extremity. There is surrounding erythema and warmth compared to the right. A healing wound just posterior to the area of redness just proximal to the left ankle.  Neurological: Trevor Burch is alert and oriented to person, place, and time.  5/5 motor in all extremities. Sensation is fully intact.  Skin: Skin is warm and dry. No rash noted. There is erythema.  Psychiatric: Trevor Burch has a normal mood and affect. His behavior is normal.  Nursing  note and vitals reviewed.   ED Course  Procedures (including critical care time)  DIAGNOSTIC STUDIES: Oxygen Saturation is 96% on RA, normal by my interpretation.    COORDINATION OF CARE: 11:23 PM- Pt advised of plan for treatment and pt agrees.  Labs Review Labs Reviewed  CBC WITH DIFFERENTIAL/PLATELET - Abnormal; Notable for the following:    RBC 3.16 (*)    Hemoglobin 10.3 (*)    HCT 31.0 (*)    All other components within normal limits  COMPREHENSIVE METABOLIC PANEL - Abnormal; Notable for the following:    Glucose, Bld 149 (*)    BUN 67 (*)    Creatinine, Ser 4.09 (*)    Total Protein 5.9 (*)    Albumin 2.7 (*)    GFR calc non Af Amer 12 (*)    GFR calc Af Amer 14 (*)    All other components within normal limits    Imaging Review No results found.   EKG Interpretation   Date/Time:  Sunday January 11 2015 23:47:41 EST Ventricular Rate:  83 PR Interval:  227 QRS Duration: 112 QT Interval:  443 QTC Calculation: 521 R Axis:   -3 Text Interpretation:  Sinus or ectopic atrial rhythm Borderline prolonged  PR interval Anterior infarct, old Repol abnrm suggests ischemia,  anterolateral Prolonged QT interval Baseline wander in lead(s) V2 V3 V6  Confirmed by Ranae Palms  MD, Pricella Gaugh ([id number]) on 01/12/2015 12:29:56 AM      MDM   Final diagnoses:  Fall, initial encounter   Skin tear of elbow without complication, right, initial encounter  Cellulitis of left ankle    I personally performed the services described in this documentation, which was scribed in my presence. The recorded information has been reviewed and is accurate.  No recent EKG for comparison. Patient denies any shortness of breath or chest pain. Trevor Burch'll follow-up with his primary physician.   Patient with chronic renal failure but improved creatinine. Patient does have some anemia which may be related to his renal failure. Patient refusing UA. Patient with left lower extremity redness concerning for cellulitis. Given dose of clindamycin in the emergency department. The sinuses been marked. Trevor Burch was advised to follow-up with his primary doctor in 1-2 days to have the cellulitis reevaluated. Trevor Burch's been given return precautions and is voiced understanding.  Skin tear has been cleaned and dressed. No significant injury noted. No head injury. Normal neurologic exam. Again return precautions have been given.  Loren Racer, MD 01/12/15 [phone]

## 2015-01-11 NOTE — ED Notes (Addendum)
Patient was at home this evening and tripped over a dog toy and fell. Patient has large skin tear on right arm with bleeding controlled. Patient denies being on blood thinners. Patient denies hitting his head. No obvious injuries. Patient remembers event.

## 2015-01-12 DIAGNOSIS — S51001A Unspecified open wound of right elbow, initial encounter: Secondary | ICD-10-CM | POA: Diagnosis not present

## 2015-01-12 LAB — COMPREHENSIVE METABOLIC PANEL
ALBUMIN: 2.7 g/dL — AB (ref 3.5–5.2)
ALT: 15 U/L (ref 0–53)
ANION GAP: 9 (ref 5–15)
AST: 16 U/L (ref 0–37)
Alkaline Phosphatase: 77 U/L (ref 39–117)
BILIRUBIN TOTAL: 0.4 mg/dL (ref 0.3–1.2)
BUN: 67 mg/dL — ABNORMAL HIGH (ref 6–23)
CO2: 19 mmol/L (ref 19–32)
CREATININE: 4.09 mg/dL — AB (ref 0.50–1.35)
Calcium: 9.4 mg/dL (ref 8.4–10.5)
Chloride: 109 mmol/L (ref 96–112)
GFR calc Af Amer: 14 mL/min — ABNORMAL LOW (ref >90)
GFR, EST NON AFRICAN AMERICAN: 12 mL/min — AB (ref >90)
Glucose, Bld: 149 mg/dL — ABNORMAL HIGH (ref 70–99)
Potassium: 3.7 mmol/L (ref 3.5–5.1)
Sodium: 137 mmol/L (ref 135–145)
Total Protein: 5.9 g/dL — ABNORMAL LOW (ref 6.0–8.3)

## 2015-01-12 MED ORDER — CLINDAMYCIN HCL 300 MG PO CAPS: 300.0000 mg | ORAL_CAPSULE | Freq: Four times a day (QID) | ORAL | Status: DC

## 2015-01-12 NOTE — Discharge Instructions (Signed)
Make appointment to follow-up with your primary physician on Tuesday. Return immediately for worsening redness, swelling, fever or for any concerns.  Cellulitis Cellulitis is an infection of the skin and the tissue beneath it. The infected area is usually red and tender. Cellulitis occurs most often in the arms and lower legs.  CAUSES  Cellulitis is caused by bacteria that enter the skin through cracks or cuts in the skin. The most common types of bacteria that cause cellulitis are staphylococci and streptococci. SIGNS AND SYMPTOMS   Redness and warmth.  Swelling.  Tenderness or pain.  Fever. DIAGNOSIS  Your health care provider can usually determine what is wrong based on a physical exam. Blood tests may also be done. TREATMENT  Treatment usually involves taking an antibiotic medicine. HOME CARE INSTRUCTIONS   Take your antibiotic medicine as directed by your health care provider. Finish the antibiotic even if you start to feel better.  Keep the infected arm or leg elevated to reduce swelling.  Apply a warm cloth to the affected area up to 4 times per day to relieve pain.  Take medicines only as directed by your health care provider.  Keep all follow-up visits as directed by your health care provider. SEEK MEDICAL CARE IF:   You notice red streaks coming from the infected area.  Your red area gets larger or turns dark in color.  Your bone or joint underneath the infected area becomes painful after the skin has healed.  Your infection returns in the same area or another area.  You notice a swollen bump in the infected area.  You develop new symptoms.  You have a fever. SEEK IMMEDIATE MEDICAL CARE IF:   You feel very sleepy.  You develop vomiting or diarrhea.  You have a general ill feeling (malaise) with muscle aches and pains. MAKE SURE YOU:   Understand these instructions.  Will watch your condition.  Will get help right away if you are not doing well or  get worse. Document Released: 08/17/2005 Document Revised: 03/24/2014 Document Reviewed: 01/23/2012 [facility] Patient Information 2015 [employer], [place]. This information is not intended to replace advice given to you by your health care provider. Make sure you discuss any questions you have with your health care provider.

## 2015-01-16 ENCOUNTER — Encounter: Payer: Self-pay | Admitting: Internal Medicine

## 2015-01-16 ENCOUNTER — Ambulatory Visit (INDEPENDENT_AMBULATORY_CARE_PROVIDER_SITE_OTHER): Payer: Medicare Other | Admitting: Internal Medicine

## 2015-01-16 ENCOUNTER — Ambulatory Visit (INDEPENDENT_AMBULATORY_CARE_PROVIDER_SITE_OTHER)
Admission: RE | Admit: 2015-01-16 | Discharge: 2015-01-16 | Disposition: A | Discharge status: Home / Self Care | Payer: Medicare Other | Source: Ambulatory Visit | Attending: Internal Medicine | Admitting: Internal Medicine

## 2015-01-16 DIAGNOSIS — J90 Pleural effusion, not elsewhere classified: Secondary | ICD-10-CM

## 2015-01-16 NOTE — Patient Instructions (Addendum)
Please remember to go to the    x-ray department downstairs for your tests - we will call you with the results when they are available and set up follow up if needed  lated add: f/u in 3 months with cxr, call sooner prn

## 2015-01-16 NOTE — Progress Notes (Signed)
Subjective:    Patient ID: Trevor Burch, male    DOB: 11-26-1930, MRN: [id number]    Brief patient profile:  3 yowm never smoker with Dengue fever in [place] in 1960s but no agent orange exp and tendency to clear throat x "sometime in 2014" p pna mch then  "felt sniffles and more cough than usual" early November 2015  assoc wife feeling wasn't himself > primary care 10/29/14 > cxr with small L effusion so referred to pulmonary clinic 11/06/2014 by Dr Nyoka Cowden    History of Present Illness  11/06/2014 1st [facility] office visit/ Trevor Burch   Chief Complaint  Patient presents with  . Pulmonary Consult    Referred by Dr. Kateri Plummer for eval of pleural effusion.  Pt c/o cough for the past month- non prod.  He also c/o occ CP "when i touch my chest".   onset of symptoms correlating with cxr not clear as no acute event and never had fever/ never had nasty mucus / does have CRI Last dental work in June/July 2015 The cp is longstanding and only provoked by touching chest, not by breathing or coughing, and  no h/o assoc rash or known zoster  The cough is more of a throat clearing and is really no different for over a year with a nl cxr 05/19/14 on record. rec GERD diet    12/03/2014 f/u ov/Trevor Burch re: chronic cough/ L pl effusion ? Significant  Chief Complaint  Patient presents with  . Follow-up    head cold    Still using fish oil Cough is day > noct No cp at all now Not limited by breathing from desired activities  But very sedentary, able to lie flat rec  no change rx   01/16/2015 f/u ov/Trevor Burch re: chronic L pleural effusion   Chief Complaint  Patient presents with  . Follow-up    Pt states that his breathing is doing well. Had a fall and went to ED on 01/12/15.      Not limited by breathing from desired activities    No obvious day to day or daytime variabilty or assoc chronic cough or cp or chest tightness, subjective wheeze overt sinus or hb symptoms. No unusual exp hx or h/o  childhood pna/ asthma or knowledge of premature birth.  Sleeping ok without nocturnal  or early am exacerbation  of respiratory  c/o's or need for noct saba. Also denies any obvious fluctuation of symptoms with weather or environmental changes or other aggravating or alleviating factors except as outlined above   Current Medications, Allergies, Complete Past Medical History, Past Surgical History, Family History, and Social History were reviewed in [employer] record.  ROS  The following are not active complaints unless bolded sore throat, dysphagia, dental problems, itching, sneezing,  nasal congestion or excess/ purulent secretions, ear ache,   fever, chills, sweats, unintended wt loss, pleuritic or exertional cp, hemoptysis,  orthopnea pnd or leg swelling, presyncope, palpitations, heartburn, abdominal pain, anorexia, nausea, vomiting, diarrhea  or change in bowel or urinary habits, change in stools or urine, dysuria,hematuria,  rash, arthralgias, visual complaints, headache, numbness weakness or ataxia or problems with walking or coordination,  change in mood/affect or memory.              .       Objective:   Physical Exam  amb wm extremely hoh  12/03/14            205 > 01/16/2015  Wt Readings from Last 3 Encounters:  11/06/14 212 lb (96.163 kg)  04/24/14 209 lb (94.802 kg)  01/21/14 209 lb (94.802 kg)    Vital signs reviewed   HEENT: nl dentition, turbinates, and orophanx. Nl external ear canals without cough reflex   NECK :  without JVD/Nodes/TM/ nl carotid upstrokes bilaterally   LUNGS: no acc muscle use, clear to and A and P    CV:  RRR  no s3  - II-III/VI sem , no  increase in P2, no edema   ABD:  soft and nontender with nl excursion in the supine position. No bruits or organomegaly, bowel sounds nl  MS:  warm without deformities, calf tenderness, cyanosis or clubbing  SKIN: warm and dry without lesions        CXR:  01/16/15 I  personally reviewed images and agree with radiology impression as follows:   Stable scarring or pleural effusion is seen in left lung base. Mildly increased left basilar opacity is noted most consistent with subsegmental atelectasis.    Chemistry      Component Value Date/Time   NA 140 01/12/2014 1445   K 3.8 01/12/2014 1445   CL 106 01/12/2014 1445   CO2 20 01/12/2014 1445   BUN 91* 01/12/2014 1445   CREATININE 4.22* 01/12/2014 1445      Component Value Date/Time   CALCIUM 9.6 01/12/2014 1445   ALKPHOS 67 11/06/2014 1026   AST 18 11/06/2014 1026   ALT 17 11/06/2014 1026   BILITOT 0.5 11/06/2014 1026       Lab Results  Component Value Date   WBC 8.5 01/12/2014   HGB 12.0* 01/12/2014   HCT 35.3* 01/12/2014   MCV 99.4 01/12/2014   PLT 238 01/12/2014    Lab Results  Component Value Date   TSH 1.82 11/06/2014     Lab Results  Component Value Date   PROBNP 316.0* 11/06/2014     Lab Results  Component Value Date   ESRSEDRATE 29* 11/06/2014           Assessment & Plan:

## 2015-01-17 NOTE — Assessment & Plan Note (Signed)
Onset clinically / radiographically around 1st week in Dec 2015   I had an extended discussion with the patient reviewing all relevant studies completed to date and  lasting 15 to 20 minutes of a 25 minute visit on the following ongoing concerns:  Continue to feel this was most likely a mild parapneumonic process with some pleural reaction that is too small to characterize further but very unlikely to be of any clinic significance  Will plan f/u in 3 months to "close the loop"

## 2015-01-19 NOTE — Progress Notes (Signed)
Quick Note:  LMTCB ______ 

## 2015-01-20 NOTE — Progress Notes (Signed)
Quick Note:  Spoke with pt and notified of results per Dr. Wert. Pt verbalized understanding and denied any questions.  ______ 

## 2015-02-07 ENCOUNTER — Inpatient Hospital Stay (HOSPITAL_COMMUNITY)
Admission: EM | Admit: 2015-02-07 | Discharge: 2015-02-11 | DRG: 689 | Disposition: A | Discharge status: Skilled Nursing Facility | Payer: Medicare Other | Attending: Internal Medicine | Admitting: Internal Medicine

## 2015-02-07 ENCOUNTER — Emergency Department (HOSPITAL_COMMUNITY): Payer: Medicare Other

## 2015-02-07 ENCOUNTER — Encounter (HOSPITAL_COMMUNITY): Payer: Self-pay | Admitting: Emergency Medicine

## 2015-02-07 DIAGNOSIS — J90 Pleural effusion, not elsewhere classified: Secondary | ICD-10-CM | POA: Diagnosis present

## 2015-02-07 DIAGNOSIS — I4891 Unspecified atrial fibrillation: Secondary | ICD-10-CM | POA: Diagnosis present

## 2015-02-07 DIAGNOSIS — Z792 Long term (current) use of antibiotics: Secondary | ICD-10-CM | POA: Diagnosis not present

## 2015-02-07 DIAGNOSIS — Z8619 Personal history of other infectious and parasitic diseases: Secondary | ICD-10-CM

## 2015-02-07 DIAGNOSIS — Z7982 Long term (current) use of aspirin: Secondary | ICD-10-CM | POA: Diagnosis not present

## 2015-02-07 DIAGNOSIS — M1990 Unspecified osteoarthritis, unspecified site: Secondary | ICD-10-CM | POA: Diagnosis present

## 2015-02-07 DIAGNOSIS — N359 Urethral stricture, unspecified: Secondary | ICD-10-CM | POA: Diagnosis present

## 2015-02-07 DIAGNOSIS — Z79899 Other long term (current) drug therapy: Secondary | ICD-10-CM

## 2015-02-07 DIAGNOSIS — N390 Urinary tract infection, site not specified: Principal | ICD-10-CM

## 2015-02-07 DIAGNOSIS — Y92009 Unspecified place in unspecified non-institutional (private) residence as the place of occurrence of the external cause: Secondary | ICD-10-CM

## 2015-02-07 DIAGNOSIS — Z87440 Personal history of urinary (tract) infections: Secondary | ICD-10-CM | POA: Diagnosis not present

## 2015-02-07 DIAGNOSIS — W19XXXA Unspecified fall, initial encounter: Secondary | ICD-10-CM

## 2015-02-07 DIAGNOSIS — K219 Gastro-esophageal reflux disease without esophagitis: Secondary | ICD-10-CM | POA: Diagnosis present

## 2015-02-07 DIAGNOSIS — K644 Residual hemorrhoidal skin tags: Secondary | ICD-10-CM | POA: Diagnosis present

## 2015-02-07 DIAGNOSIS — R531 Weakness: Secondary | ICD-10-CM | POA: Diagnosis present

## 2015-02-07 DIAGNOSIS — E780 Pure hypercholesterolemia: Secondary | ICD-10-CM | POA: Diagnosis present

## 2015-02-07 DIAGNOSIS — N184 Chronic kidney disease, stage 4 (severe): Secondary | ICD-10-CM | POA: Diagnosis present

## 2015-02-07 DIAGNOSIS — I5033 Acute on chronic diastolic (congestive) heart failure: Secondary | ICD-10-CM | POA: Diagnosis present

## 2015-02-07 DIAGNOSIS — I129 Hypertensive chronic kidney disease with stage 1 through stage 4 chronic kidney disease, or unspecified chronic kidney disease: Secondary | ICD-10-CM | POA: Diagnosis present

## 2015-02-07 DIAGNOSIS — J9811 Atelectasis: Secondary | ICD-10-CM | POA: Diagnosis present

## 2015-02-07 DIAGNOSIS — I10 Essential (primary) hypertension: Secondary | ICD-10-CM | POA: Diagnosis present

## 2015-02-07 DIAGNOSIS — N189 Chronic kidney disease, unspecified: Secondary | ICD-10-CM | POA: Diagnosis present

## 2015-02-07 DIAGNOSIS — N320 Bladder-neck obstruction: Secondary | ICD-10-CM | POA: Diagnosis present

## 2015-02-07 DIAGNOSIS — Z8249 Family history of ischemic heart disease and other diseases of the circulatory system: Secondary | ICD-10-CM

## 2015-02-07 DIAGNOSIS — Z79891 Long term (current) use of opiate analgesic: Secondary | ICD-10-CM | POA: Diagnosis not present

## 2015-02-07 LAB — URINALYSIS, ROUTINE W REFLEX MICROSCOPIC
BILIRUBIN URINE: NEGATIVE
GLUCOSE, UA: NEGATIVE mg/dL
KETONES UR: NEGATIVE mg/dL
NITRITE: NEGATIVE
PH: 5 (ref 5.0–8.0)
Protein, ur: 100 mg/dL — AB
Specific Gravity, Urine: 1.018 (ref 1.005–1.030)
UROBILINOGEN UA: 0.2 mg/dL (ref 0.0–1.0)

## 2015-02-07 LAB — CBC
HCT: 33.6 % — ABNORMAL LOW (ref 39.0–52.0)
Hemoglobin: 10.9 g/dL — ABNORMAL LOW (ref 13.0–17.0)
MCH: 33.7 pg (ref 26.0–34.0)
MCHC: 32.4 g/dL (ref 30.0–36.0)
MCV: 104 fL — ABNORMAL HIGH (ref 78.0–100.0)
Platelets: 291 10*3/uL (ref 150–400)
RBC: 3.23 MIL/uL — ABNORMAL LOW (ref 4.22–5.81)
RDW: 13.9 % (ref 11.5–15.5)
WBC: 7.2 10*3/uL (ref 4.0–10.5)

## 2015-02-07 LAB — PHOSPHORUS: Phosphorus: 6 mg/dL — ABNORMAL HIGH (ref 2.3–4.6)

## 2015-02-07 LAB — URINE MICROSCOPIC-ADD ON

## 2015-02-07 LAB — I-STAT CG4 LACTIC ACID, ED: Lactic Acid, Venous: 0.93 mmol/L (ref 0.5–2.0)

## 2015-02-07 LAB — COMPREHENSIVE METABOLIC PANEL
ALK PHOS: 81 U/L (ref 39–117)
ALT: 17 U/L (ref 0–53)
AST: 18 U/L (ref 0–37)
Albumin: 2.9 g/dL — ABNORMAL LOW (ref 3.5–5.2)
Anion gap: 9 (ref 5–15)
BUN: 74 mg/dL — ABNORMAL HIGH (ref 6–23)
CHLORIDE: 109 mmol/L (ref 96–112)
CO2: 19 mmol/L (ref 19–32)
Calcium: 9.4 mg/dL (ref 8.4–10.5)
Creatinine, Ser: 4.24 mg/dL — ABNORMAL HIGH (ref 0.50–1.35)
GFR calc Af Amer: 14 mL/min — ABNORMAL LOW (ref >90)
GFR, EST NON AFRICAN AMERICAN: 12 mL/min — AB (ref >90)
GLUCOSE: 109 mg/dL — AB (ref 70–99)
POTASSIUM: 4.5 mmol/L (ref 3.5–5.1)
Sodium: 137 mmol/L (ref 135–145)
TOTAL PROTEIN: 6.5 g/dL (ref 6.0–8.3)
Total Bilirubin: 0.6 mg/dL (ref 0.3–1.2)

## 2015-02-07 LAB — TSH: TSH: 0.026 u[IU]/mL — ABNORMAL LOW (ref 0.350–4.500)

## 2015-02-07 LAB — MAGNESIUM: Magnesium: 2.7 mg/dL — ABNORMAL HIGH (ref 1.5–2.5)

## 2015-02-07 MED ORDER — BRIMONIDINE TARTRATE 0.2 % OP SOLN
1.0000 [drp] | Freq: Two times a day (BID) | OPHTHALMIC | Status: DC
  Administered 2015-02-07 – 2015-02-11 (×8): 1 [drp] via OPHTHALMIC
  Filled 2015-02-07: qty 5

## 2015-02-07 MED ORDER — PANTOPRAZOLE SODIUM 40 MG PO TBEC
40.0000 mg | DELAYED_RELEASE_TABLET | Freq: Every day | ORAL | Status: DC
  Administered 2015-02-08 – 2015-02-11 (×4): 40 mg via ORAL
  Filled 2015-02-07 (×5): qty 1

## 2015-02-07 MED ORDER — TRAMADOL HCL 50 MG PO TABS
50.0000 mg | ORAL_TABLET | Freq: Four times a day (QID) | ORAL | Status: DC | PRN
  Administered 2015-02-10: 50 mg via ORAL
  Filled 2015-02-07: qty 1

## 2015-02-07 MED ORDER — DORZOLAMIDE HCL-TIMOLOL MAL PF 22.3-6.8 MG/ML OP SOLN: 1.0000 [drp] | Freq: Two times a day (BID) | OPHTHALMIC | Status: DC

## 2015-02-07 MED ORDER — ADULT MULTIVITAMIN W/MINERALS CH
1.0000 | ORAL_TABLET | Freq: Every day | ORAL | Status: DC
Start: 2015-02-08 — End: 2015-02-11
  Administered 2015-02-08 – 2015-02-11 (×4): 1 via ORAL
  Filled 2015-02-07 (×4): qty 1

## 2015-02-07 MED ORDER — ASPIRIN EC 81 MG PO TBEC
81.0000 mg | DELAYED_RELEASE_TABLET | Freq: Every day | ORAL | Status: DC
  Administered 2015-02-08 – 2015-02-11 (×4): 81 mg via ORAL
  Filled 2015-02-07 (×4): qty 1

## 2015-02-07 MED ORDER — SODIUM CHLORIDE 0.9 % IV SOLN: 250.0000 mL | INTRAVENOUS | Status: DC | PRN

## 2015-02-07 MED ORDER — ALBUTEROL SULFATE (2.5 MG/3ML) 0.083% IN NEBU: 2.5000 mg | INHALATION_SOLUTION | RESPIRATORY_TRACT | Status: DC | PRN

## 2015-02-07 MED ORDER — HEPARIN SODIUM (PORCINE) 5000 UNIT/ML IJ SOLN
5000.0000 [IU] | Freq: Three times a day (TID) | INTRAMUSCULAR | Status: DC
Start: 2015-02-07 — End: 2015-02-11
  Administered 2015-02-07 – 2015-02-11 (×12): 5000 [IU] via SUBCUTANEOUS
  Filled 2015-02-07 (×14): qty 1

## 2015-02-07 MED ORDER — SODIUM CHLORIDE 0.9 % IJ SOLN: 3.0000 mL | INTRAMUSCULAR | Status: DC | PRN

## 2015-02-07 MED ORDER — SODIUM CHLORIDE 0.9 % IV SOLN
INTRAVENOUS | Status: DC
  Administered 2015-02-07: 18:00:00 via INTRAVENOUS

## 2015-02-07 MED ORDER — DORZOLAMIDE HCL-TIMOLOL MAL 2-0.5 % OP SOLN
1.0000 [drp] | Freq: Two times a day (BID) | OPHTHALMIC | Status: DC
  Administered 2015-02-07 – 2015-02-11 (×8): 1 [drp] via OPHTHALMIC
  Filled 2015-02-07: qty 10

## 2015-02-07 MED ORDER — ALBUTEROL SULFATE HFA 108 (90 BASE) MCG/ACT IN AERS: 2.0000 | INHALATION_SPRAY | RESPIRATORY_TRACT | Status: DC | PRN

## 2015-02-07 MED ORDER — AMIODARONE HCL 200 MG PO TABS
200.0000 mg | ORAL_TABLET | Freq: Every day | ORAL | Status: DC
  Administered 2015-02-08 – 2015-02-11 (×4): 200 mg via ORAL
  Filled 2015-02-07 (×4): qty 1

## 2015-02-07 MED ORDER — SODIUM CHLORIDE 0.9 % IJ SOLN
3.0000 mL | Freq: Two times a day (BID) | INTRAMUSCULAR | Status: DC
Start: 2015-02-07 — End: 2015-02-11
  Administered 2015-02-09 – 2015-02-10 (×3): 3 mL via INTRAVENOUS

## 2015-02-07 MED ORDER — SODIUM CHLORIDE 0.9 % IV SOLN
INTRAVENOUS | Status: DC
  Administered 2015-02-07: 999 mL/h via INTRAVENOUS

## 2015-02-07 MED ORDER — DEXTROSE 5 % IV SOLN
1.0000 g | Freq: Once | INTRAVENOUS | Status: AC
  Administered 2015-02-07: 1 g via INTRAVENOUS
  Filled 2015-02-07: qty 10

## 2015-02-07 MED ORDER — CEFTRIAXONE SODIUM IN DEXTROSE 20 MG/ML IV SOLN
1.0000 g | INTRAVENOUS | Status: DC
  Administered 2015-02-08 – 2015-02-10 (×3): 1 g via INTRAVENOUS
  Filled 2015-02-07 (×3): qty 50

## 2015-02-07 MED ORDER — SODIUM CHLORIDE 0.9 % IJ SOLN
3.0000 mL | Freq: Two times a day (BID) | INTRAMUSCULAR | Status: DC
  Administered 2015-02-08 – 2015-02-11 (×4): 3 mL via INTRAVENOUS

## 2015-02-07 MED ORDER — ACETAMINOPHEN 325 MG PO TABS: 650.0000 mg | ORAL_TABLET | Freq: Four times a day (QID) | ORAL | Status: DC | PRN

## 2015-02-07 MED ORDER — SODIUM CHLORIDE 0.9 % IV BOLUS (SEPSIS)
1000.0000 mL | Freq: Once | INTRAVENOUS | Status: AC
  Administered 2015-02-07: 1000 mL via INTRAVENOUS

## 2015-02-07 MED ORDER — FUROSEMIDE 80 MG PO TABS
80.0000 mg | ORAL_TABLET | Freq: Two times a day (BID) | ORAL | Status: DC
  Administered 2015-02-09 – 2015-02-11 (×5): 80 mg via ORAL
  Filled 2015-02-07 (×7): qty 1

## 2015-02-07 NOTE — ED Notes (Signed)
Pt and his granddaughter are asking what the next step is.  I informed them both that pt will have to be assessed by one of our EDPs.

## 2015-02-07 NOTE — ED Notes (Addendum)
Pt comes from home c/o generalized weakness and UTI symptoms but has no urinary problems per EMS.  Pt has recurrent UTIs. Pt states that the other day his lower legs gave out while he was ambulating with his walker.

## 2015-02-07 NOTE — ED Notes (Signed)
[facility] Expected date: 02/07/15 Expected time: 12:59 PM Means of arrival: Ambulance Comments: Fall/?UTI

## 2015-02-07 NOTE — ED Provider Notes (Signed)
CSN: [id number]     Arrival date & time 02/07/15  1300 History   First MD Initiated Contact with Patient 02/07/15 1506     Chief Complaint  Patient presents with  . generalized weakness   . UTI symptoms      (Consider location/radiation/quality/duration/timing/severity/associated sxs/prior Treatment) The history is provided by the patient.  pt with hx afib, CRI, c/o generalized weakness in the past week. States has hx utis, which have caused weakness in past. Denies any unilateral numbness or weakness. No fever or chills. No dysuria. Has had indwelling foley for the past month.  Denies change in urine/odor or cloudiness. Pt states has chronic bilateral lower leg/ankle edema, no recent change. +orthopnea, again no recent change. States has felt mildly sob, continuous, without abrupt worsening. Denies cough. +recent 'head cold', w mild congestion and scratchy throat. No trouble swallowing. Pt indicates a couple days ago when walking w walker, bilateral legs just gave way. Denies injury w fall. No faintness or dizziness. No loc. No headache. No neck or back pain. No chest pain or discomfort. No abd pain. No nvd. No recent blood loss, no rectal bleeding or melena. No anticoag use, only baby asa. No recent change in meds.      Past Medical History  Diagnosis Date  . Kidney function abnormal     has approx. 20% function - stablized x several years  . GERD (gastroesophageal reflux disease)   . Hypertension   . Atrial fibrillation   . Community acquired pneumonia   . Hypercholesteremia   . Allergic rhinitis   . Chronic kidney disease     RENAL INSUFFICENCY   . Arthritis     BILATERAL KNEES  . CHF (congestive heart failure)   . Hepatitis     HX OF HEP B   Past Surgical History  Procedure Laterality Date  . Appendectomy    . Tonsillectomy    . Prostate surgery      for enlarged prostate and blockages - no cancer  . Cardioversion N/A 11/01/2013    Procedure: CARDIOVERSION;  Surgeon:  Lesleigh Noe, MD;  Location: [facility] OR;  Service: Cardiovascular;  Laterality: N/A;   Family History  Problem Relation Age of Onset  . Heart disease Father    History  Substance Use Topics  . Smoking status: Never Smoker   . Smokeless tobacco: Never Used  . Alcohol Use: Yes     Comment: ocassionally    Review of Systems  Constitutional: Negative for fever and chills.  HENT: Negative for sore throat.   Eyes: Negative for redness.  Respiratory: Positive for shortness of breath. Negative for cough.   Cardiovascular: Negative for chest pain and leg swelling.  Gastrointestinal: Negative for vomiting, abdominal pain, diarrhea and constipation.  Endocrine: Negative for polyuria.  Genitourinary: Negative for dysuria, hematuria and flank pain.  Musculoskeletal: Negative for back pain, neck pain and neck stiffness.  Skin: Negative for rash.  Neurological: Negative for numbness and headaches.  Hematological: Does not bruise/bleed easily.  Psychiatric/Behavioral: Negative for confusion.      Allergies  Review of patient's allergies indicates no known allergies.  Home Medications   Prior to Admission medications   Medication Sig Start Date End Date Taking? Authorizing Provider  acetaminophen (TYLENOL) 325 MG tablet Take 650 mg by mouth every 6 (six) hours as needed for moderate pain.   Yes Historical Provider, MD  amiodarone (PACERONE) 200 MG tablet Take 200 mg by mouth daily.   Yes Historical  Provider, MD  amLODipine (NORVASC) 5 MG tablet Take 5 mg by mouth 2 (two) times daily.   Yes Historical Provider, MD  aspirin EC 81 MG tablet Take 1 tablet (81 mg total) by mouth daily. 04/24/14  Yes Lyn Records, MD  brimonidine (ALPHAGAN) 0.2 % ophthalmic solution Place 1 drop into the left eye 2 (two) times daily.   Yes Historical Provider, MD  cephALEXin (KEFLEX) 250 MG capsule Take 250 mg by mouth daily.   Yes Historical Provider, MD  CHOLINE PO Take 60 mg by mouth daily.    Yes Historical  Provider, MD  clindamycin (CLEOCIN) 150 MG capsule Take 1 capsule by mouth 4 (four) times daily. 01/12/15  Yes Historical Provider, MD  dexlansoprazole (DEXILANT) 60 MG capsule Take 60 mg by mouth daily.   Yes Historical Provider, MD  diclofenac sodium (VOLTAREN) 1 % GEL Apply 2 g topically 3 (three) times daily as needed (inflammation/pain).   Yes Historical Provider, MD  Dorzolamide HCl-Timolol Mal PF 22.3-6.8 MG/ML SOLN Place 1 drop into the left eye 2 (two) times daily.   Yes Historical Provider, MD  furosemide (LASIX) 80 MG tablet Take 80 mg by mouth 2 (two) times daily.  11/29/13  Yes Scott T Alben Spittle, PA-C  LUTEIN PO Take 2 mg by mouth daily.   Yes Historical Provider, MD  Multiple Vitamin (MULTIVITAMIN WITH MINERALS) TABS tablet Take 1 tablet by mouth daily.   Yes Historical Provider, MD  OVER THE COUNTER MEDICATION Take 1 tablet by mouth 2 (two) times daily. Joint Care   Yes Historical Provider, MD  PROAIR HFA 108 (90 BASE) MCG/ACT inhaler Inhale 2 puffs into the lungs every 4 (four) hours as needed for wheezing.  01/29/15  Yes Historical Provider, MD  traMADol (ULTRAM) 50 MG tablet Take 50 mg by mouth every 6 (six) hours as needed for moderate pain.   Yes Historical Provider, MD   BP 129/59 mmHg  Pulse 70  Temp(Src) 98.6 F (37 C)  Resp 18  SpO2 98% Physical Exam  Constitutional: He is oriented to person, place, and time. He appears well-developed and well-nourished. No distress.  HENT:  Head: Atraumatic.  Mouth/Throat: Oropharynx is clear and moist.  Eyes: Conjunctivae are normal. Pupils are equal, round, and reactive to light. No scleral icterus.  Neck: Neck supple. No tracheal deviation present. No thyromegaly present.  Cardiovascular: Normal rate, normal heart sounds and intact distal pulses.  Exam reveals no gallop and no friction rub.   No murmur heard. Pulmonary/Chest: Effort normal and breath sounds normal. No accessory muscle usage. No respiratory distress. He exhibits no  tenderness.  Abdominal: Soft. Bowel sounds are normal. He exhibits no distension and no mass. There is no tenderness. There is no rebound and no guarding.  Genitourinary:  No cva tenderness  Musculoskeletal: Normal range of motion. He exhibits edema. He exhibits no tenderness.  Symmetric bilateral lower leg and ankle edema. Good rom bil ext without pain or focal bony tenderness.  CTLS spine, non tender, aligned, no step off.   Neurological: He is alert and oriented to person, place, and time.  Motor intact bil, stre 5/5. sens grossly intact.   Skin: Skin is warm and dry. No rash noted. He is not diaphoretic.  Psychiatric: He has a normal mood and affect.  Nursing note and vitals reviewed.   ED Course  Procedures (including critical care time) Labs Review  Results for orders placed or performed during the hospital encounter of 02/07/15  CBC  Result Value Ref Range   WBC 7.2 4.0 - 10.5 K/uL   RBC 3.23 (L) 4.22 - 5.81 MIL/uL   Hemoglobin 10.9 (L) 13.0 - 17.0 g/dL   HCT 96.0 (L) 45.4 - 09.8 %   MCV 104.0 (H) 78.0 - 100.0 fL   MCH 33.7 26.0 - 34.0 pg   MCHC 32.4 30.0 - 36.0 g/dL   RDW 11.9 14.7 - 82.9 %   Platelets 291 150 - 400 K/uL  Comprehensive metabolic panel  Result Value Ref Range   Sodium 137 135 - 145 mmol/L   Potassium 4.5 3.5 - 5.1 mmol/L   Chloride 109 96 - 112 mmol/L   CO2 19 19 - 32 mmol/L   Glucose, Bld 109 (H) 70 - 99 mg/dL   BUN 74 (H) 6 - 23 mg/dL   Creatinine, Ser 5.62 (H) 0.50 - 1.35 mg/dL   Calcium 9.4 8.4 - 13.0 mg/dL   Total Protein 6.5 6.0 - 8.3 g/dL   Albumin 2.9 (L) 3.5 - 5.2 g/dL   AST 18 0 - 37 U/L   ALT 17 0 - 53 U/L   Alkaline Phosphatase 81 39 - 117 U/L   Total Bilirubin 0.6 0.3 - 1.2 mg/dL   GFR calc non Af Amer 12 (L) >90 mL/min   GFR calc Af Amer 14 (L) >90 mL/min   Anion gap 9 5 - 15  Urinalysis, Routine w reflex microscopic  Result Value Ref Range   Color, Urine YELLOW YELLOW   APPearance TURBID (A) CLEAR   Specific Gravity, Urine  1.018 1.005 - 1.030   pH 5.0 5.0 - 8.0   Glucose, UA NEGATIVE NEGATIVE mg/dL   Hgb urine dipstick MODERATE (A) NEGATIVE   Bilirubin Urine NEGATIVE NEGATIVE   Ketones, ur NEGATIVE NEGATIVE mg/dL   Protein, ur 865 (A) NEGATIVE mg/dL   Urobilinogen, UA 0.2 0.0 - 1.0 mg/dL   Nitrite NEGATIVE NEGATIVE   Leukocytes, UA LARGE (A) NEGATIVE  Urine microscopic-add on  Result Value Ref Range   Squamous Epithelial / LPF RARE RARE   WBC, UA TOO NUMEROUS TO COUNT <3 WBC/hpf   RBC / HPF 0-2 <3 RBC/hpf   Bacteria, UA MANY (A) RARE  I-Stat CG4 Lactic Acid, ED  Result Value Ref Range   Lactic Acid, Venous 0.93 0.5 - 2.0 mmol/L   Dg Chest 2 View  02/07/2015   CLINICAL DATA:  Weakness  EXAM: CHEST  2 VIEW  COMPARISON:  01/16/2015  FINDINGS: Cardiac shadow is at the upper limits of normal in size. Small bilateral pleural effusions are noted worse than that seen on the prior study. Left lower lobe infiltrate is noted associated with the effusion.  IMPRESSION: Left lower lobe pneumonia with bilateral effusions   Electronically Signed   By: Alcide Clever M.D.   On: 02/07/2015 16:22   Dg Chest 2 View  01/16/2015   CLINICAL DATA:  Pleural effusion.  EXAM: CHEST  2 VIEW  COMPARISON:  December 03, 2014.  February 26, 2009.  FINDINGS: Stable cardiomediastinal silhouette. No pneumothorax is noted. Stable calcified nodule is noted in left midlung which is unchanged compared to prior exam of 2010 and most consistent with benign granuloma. Right lung is clear. Stable blunting of left costophrenic sulcus is noted consistent with scarring or plural effusion. Left basilar opacity is noted concerning for subsegmental atelectasis or scarring. Bony thorax is intact.  IMPRESSION: Stable scarring or pleural effusion is seen in left lung base. Mildly increased left basilar opacity is  noted most consistent with subsegmental atelectasis.   Electronically Signed   By: Lupita RaiderJames  Green Jr, M.D.   On: 01/16/2015 13:34       EKG  Interpretation   Date/Time:  Saturday February 07 2015 15:34:47 EDT Ventricular Rate:  68 PR Interval:    QRS Duration: 112 QT Interval:  415 QTC Calculation: 441 R Axis:   42 Text Interpretation:  Atrial fibrillation Non-specific intra-ventricular  conduction delay Nonspecific ST abnormality No significant change since  last tracing Confirmed by Denton LankSTEINL  MD, Caryn BeeKEVIN ([id number]) on 02/07/2015 3:50:03  PM      MDM   Iv ns. Labs. Ecg. Cxr.  Reviewed nursing notes and prior charts for additional history.   ua positive, will culture and rx. Rocephin iv.  Will have staff change foley.  No cough in ED. No rales. Radiology reading noted, on review film appears similar to prior, ?chronic effusion w scarring/atelectasis.  Currently, clinically, no pna.   Given uti, weakness, initial tachycardia w soft bp, will admit/obs to med service.       Cathren LaineKevin Torien Ramroop, MD 02/07/15 [id number]

## 2015-02-07 NOTE — Progress Notes (Signed)
Pt insists he does not want a new catheter.  His urologist said it was to come out for good Monday, and he would like that Dr consulted before removing or placing any new catheters.  Will continue to assess patient.

## 2015-02-07 NOTE — H&P (Signed)
[facility] History and Physical  Trevor Burch ZOX:[id number] DOB: 1931/07/29 DOA: 02/07/2015  Referring physician: Dr. Denton Lank PCP: Farris Has, MD   Chief Complaint: Generalized weakness  HPI: Trevor Burch is a 81 y.o. male  With history of atrial fibrillation not on anticoagulation, hypertension, GERD, chronic kidney disease, chronic diastolic heart failure. Who presents to the hospital complaining of generalized weakness. The problem started several days prior to presentation and has progressively gotten worse. The patient states he has had similar problems when he has developed urinary tract infections in the past. Otherwise he denies any difficulty breathing other than chronic problems of which she has addressed with his primary care physician. Otherwise he denies any productive cough or hemoptysis.  While in the ED patient had urinalysis suspicious for urinary tract infection. Also had chest x-ray reporting pneumonia although when compared to prior study patient has history of scarring and pleural effusion and left lung base. WBC within normal limits and lactic acid within normal limits.   Review of Systems:  Constitutional:  No weight loss, night sweats, Fevers, chills, fatigue. + Generalized weakness HEENT:  No headaches, Difficulty swallowing,Tooth/dental problems,Sore throat,  No sneezing, itching, ear ache, nasal congestion, post nasal drip,  Cardio-vascular:  No chest pain, Orthopnea, PND, swelling in lower extremities, anasarca, dizziness, palpitations  GI:  No heartburn, indigestion, abdominal pain, nausea, vomiting, diarrhea, change in bowel habits, loss of appetite  Resp:  No shortness of breath with exertion or at rest. No excess mucus, no productive cough, No non-productive cough, No coughing up of blood.No change in color of mucus.No wheezing.No chest wall deformity  Skin:  no rash or lesions.  GU:  no dysuria, change in color of urine, no urgency or  frequency. No flank pain.  Musculoskeletal:  No joint pain or swelling. No decreased range of motion. No back pain.  Psych:  No change in mood or affect. No depression or anxiety. No memory loss.   Past Medical History  Diagnosis Date  . Kidney function abnormal     has approx. 20% function - stablized x several years  . GERD (gastroesophageal reflux disease)   . Hypertension   . Atrial fibrillation   . Community acquired pneumonia   . Hypercholesteremia   . Allergic rhinitis   . Chronic kidney disease     RENAL INSUFFICENCY   . Arthritis     BILATERAL KNEES  . CHF (congestive heart failure)   . Hepatitis     HX OF HEP B   Past Surgical History  Procedure Laterality Date  . Appendectomy    . Tonsillectomy    . Prostate surgery      for enlarged prostate and blockages - no cancer  . Cardioversion N/A 11/01/2013    Procedure: CARDIOVERSION;  Surgeon: Lesleigh Noe, MD;  Location: [facility] OR;  Service: Cardiovascular;  Laterality: N/A;   Social History:  reports that he has never smoked. He has never used smokeless tobacco. He reports that he drinks alcohol. He reports that he does not use illicit drugs.  No Known Allergies  Family History  Problem Relation Age of Onset  . Heart disease Father      Prior to Admission medications   Medication Sig Start Date End Date Taking? Authorizing Provider  acetaminophen (TYLENOL) 325 MG tablet Take 650 mg by mouth every 6 (six) hours as needed for moderate pain.   Yes Historical Provider, MD  amiodarone (PACERONE) 200 MG tablet Take 200 mg by  mouth daily.   Yes Historical Provider, MD  amLODipine (NORVASC) 5 MG tablet Take 5 mg by mouth 2 (two) times daily.   Yes Historical Provider, MD  aspirin EC 81 MG tablet Take 1 tablet (81 mg total) by mouth daily. 04/24/14  Yes Lyn Records, MD  brimonidine (ALPHAGAN) 0.2 % ophthalmic solution Place 1 drop into the left eye 2 (two) times daily.   Yes Historical Provider, MD  cephALEXin (KEFLEX)  250 MG capsule Take 250 mg by mouth daily.   Yes Historical Provider, MD  CHOLINE PO Take 60 mg by mouth daily.    Yes Historical Provider, MD  clindamycin (CLEOCIN) 150 MG capsule Take 1 capsule by mouth 4 (four) times daily. 01/12/15  Yes Historical Provider, MD  dexlansoprazole (DEXILANT) 60 MG capsule Take 60 mg by mouth daily.   Yes Historical Provider, MD  diclofenac sodium (VOLTAREN) 1 % GEL Apply 2 g topically 3 (three) times daily as needed (inflammation/pain).   Yes Historical Provider, MD  Dorzolamide HCl-Timolol Mal PF 22.3-6.8 MG/ML SOLN Place 1 drop into the left eye 2 (two) times daily.   Yes Historical Provider, MD  furosemide (LASIX) 80 MG tablet Take 80 mg by mouth 2 (two) times daily.  11/29/13  Yes Scott T Alben Spittle, PA-C  LUTEIN PO Take 2 mg by mouth daily.   Yes Historical Provider, MD  Multiple Vitamin (MULTIVITAMIN WITH MINERALS) TABS tablet Take 1 tablet by mouth daily.   Yes Historical Provider, MD  OVER THE COUNTER MEDICATION Take 1 tablet by mouth 2 (two) times daily. Joint Care   Yes Historical Provider, MD  PROAIR HFA 108 (90 BASE) MCG/ACT inhaler Inhale 2 puffs into the lungs every 4 (four) hours as needed for wheezing.  01/29/15  Yes Historical Provider, MD  traMADol (ULTRAM) 50 MG tablet Take 50 mg by mouth every 6 (six) hours as needed for moderate pain.   Yes Historical Provider, MD   Physical Exam: Filed Vitals:   02/07/15 1316 02/07/15 1533  BP: 104/54 129/59  Pulse: 112 70  Temp: 98.6 F (37 C)   Resp: 22 18  SpO2: 100% 98%    Wt Readings from Last 3 Encounters:  01/16/15 93.895 kg (207 lb)  12/03/14 92.987 kg (205 lb)  12/01/14 93.441 kg (206 lb)    General:  Appears calm and comfortable Eyes: PERRL, normal lids, irises & conjunctiva ENT: grossly normal hearing, lips & tongue, dry mucous membranes Neck: no LAD, masses or thyromegaly Cardiovascular: RRR, no m/r/g. No LE edema. Respiratory: Decreased breath sounds at left base, no wheezes, no increased  work of breathing Abdomen: soft, nt,nd Skin: no rash or induration seen on limited exam Musculoskeletal: grossly normal tone BUE/BLE Psychiatric: grossly normal mood and affect, speech fluent and appropriate Neurologic: grossly non-focal.          Labs on Admission:  Basic Metabolic Panel:  Recent Labs Lab 02/07/15 1539  NA 137  K 4.5  CL 109  CO2 19  GLUCOSE 109*  BUN 74*  CREATININE 4.24*  CALCIUM 9.4   Liver Function Tests:  Recent Labs Lab 02/07/15 1539  AST 18  ALT 17  ALKPHOS 81  BILITOT 0.6  PROT 6.5  ALBUMIN 2.9*   No results for input(s): LIPASE, AMYLASE in the last 168 hours. No results for input(s): AMMONIA in the last 168 hours. CBC:  Recent Labs Lab 02/07/15 1539  WBC 7.2  HGB 10.9*  HCT 33.6*  MCV 104.0*  PLT 291  Cardiac Enzymes: No results for input(s): CKTOTAL, CKMB, CKMBINDEX, TROPONINI in the last 168 hours.  BNP (last 3 results) No results for input(s): BNP in the last 8760 hours.  ProBNP (last 3 results)  Recent Labs  11/06/14 1026  PROBNP 316.0*    CBG: No results for input(s): GLUCAP in the last 168 hours.  Radiological Exams on Admission: Dg Chest 2 View  02/07/2015   CLINICAL DATA:  Weakness  EXAM: CHEST  2 VIEW  COMPARISON:  01/16/2015  FINDINGS: Cardiac shadow is at the upper limits of normal in size. Small bilateral pleural effusions are noted worse than that seen on the prior study. Left lower lobe infiltrate is noted associated with the effusion.  IMPRESSION: Left lower lobe pneumonia with bilateral effusions   Electronically Signed   By: Alcide CleverMark  Lukens M.D.   On: 02/07/2015 16:22    EKG: Independently reviewed. Irregularly irregular  Assessment/Plan Principal Problem:   UTI (lower urinary tract infection) - Most likely cause of weakness. Agree with changing Foley. Will place on Rocephin. Await urine cultures  Active Problems:   Gastroesophageal reflux disease - Stable continue PPI    Hypertension   - Hold  antihypertensives due to soft blood pressures    Acute on chronic diastolic heart failure - Patient is currently with softer blood pressures and on the dry side. As such will hold Lasix and antihypertensives are now  Atrial fibrillation - Continue amiodarone - Most likely not on anticoagulation other than aspirin secondary to falls and weakness. At this juncture would not place on anticoagulation given generalized weakness and increased risk of falls    Pleural effusion - Most likely cause of abnormal chest x-ray finding reported as pneumonia. Patient of note also has scarring and chronic atelectasis. He is not presenting with symptoms consistent with active pneumonia as such we'll not place on broader antibiotic regimen. Should hospital condition deteriorate will consider broadening antibiotic spectrum.    CRI (chronic renal insufficiency)Chronic kidney disease, stage IV (severe) - Currently at baseline    Generalized weakness - In physical therapy evaluation - Bed rest for now until physical therapy eval - Obtain TSH, magnesium, phosphorus    Code Status: full DVT Prophylaxis: heparin Family Communication: d/c pt directly Disposition Plan: telemetry monitoring  Time spent: > 45 minutes  Penny PiaVEGA, Emeri Estill [facility] Pager [phone]

## 2015-02-07 NOTE — Progress Notes (Signed)
ANTIBIOTIC CONSULT NOTE - INITIAL  Pharmacy Consult for Ceftriaxone Indication: UTI  No Known Allergies  Patient Measurements:     Vital Signs: Temp: 97.8 F (36.6 C) (03/19 1926) Temp Source: Oral (03/19 1926) BP: 140/74 mmHg (03/19 1926) Pulse Rate: 76 (03/19 1926) Intake/Output from previous day:   Intake/Output from this shift:    Labs:  Recent Labs  02/07/15 1539  WBC 7.2  HGB 10.9*  PLT 291  CREATININE 4.24*   CrCl cannot be calculated (Unknown ideal weight.). No results for input(s): VANCOTROUGH, VANCOPEAK, VANCORANDOM, GENTTROUGH, GENTPEAK, GENTRANDOM, TOBRATROUGH, TOBRAPEAK, TOBRARND, AMIKACINPEAK, AMIKACINTROU, AMIKACIN in the last 72 hours.   Microbiology: No results found for this or any previous visit (from the past 720 hour(s)).  Medical History: Past Medical History  Diagnosis Date  . Kidney function abnormal     has approx. 20% function - stablized x several years  . GERD (gastroesophageal reflux disease)   . Hypertension   . Atrial fibrillation   . Community acquired pneumonia   . Hypercholesteremia   . Allergic rhinitis   . Chronic kidney disease     RENAL INSUFFICENCY   . Arthritis     BILATERAL KNEES  . CHF (congestive heart failure)   . Hepatitis     HX OF HEP B     Assessment: [age] yoM presents with generalized weakness.  UA suspicious for UTI.  Patient received 1 dose of Ceftriaxone in ED.  Pharmacy consulted to dose CTX for UTI.  Patient with h/o CKD, stage IV. SCr 4.24.  Goal of Therapy:  Doses adjusted per renal function Eradication of infection  Plan:  Ceftriaxone 1g IV q24h.   No renal adjustments necessary.  Pharmacy will sign off.  Clance BollRunyon, Laydon Martis 02/07/2015,8:06 PM

## 2015-02-08 LAB — BASIC METABOLIC PANEL
Anion gap: 10 (ref 5–15)
BUN: 76 mg/dL — ABNORMAL HIGH (ref 6–23)
CALCIUM: 9.6 mg/dL (ref 8.4–10.5)
CHLORIDE: 111 mmol/L (ref 96–112)
CO2: 21 mmol/L (ref 19–32)
CREATININE: 4.28 mg/dL — AB (ref 0.50–1.35)
GFR calc Af Amer: 14 mL/min — ABNORMAL LOW (ref >90)
GFR, EST NON AFRICAN AMERICAN: 12 mL/min — AB (ref >90)
GLUCOSE: 94 mg/dL (ref 70–99)
POTASSIUM: 4.6 mmol/L (ref 3.5–5.1)
Sodium: 142 mmol/L (ref 135–145)

## 2015-02-08 LAB — CBC
HCT: 31.1 % — ABNORMAL LOW (ref 39.0–52.0)
Hemoglobin: 9.9 g/dL — ABNORMAL LOW (ref 13.0–17.0)
MCH: 33.6 pg (ref 26.0–34.0)
MCHC: 31.8 g/dL (ref 30.0–36.0)
MCV: 105.4 fL — ABNORMAL HIGH (ref 78.0–100.0)
PLATELETS: 264 10*3/uL (ref 150–400)
RBC: 2.95 MIL/uL — ABNORMAL LOW (ref 4.22–5.81)
RDW: 14 % (ref 11.5–15.5)
WBC: 6.5 10*3/uL (ref 4.0–10.5)

## 2015-02-08 NOTE — Progress Notes (Signed)
UR Completed.  [phone]  

## 2015-02-08 NOTE — Progress Notes (Signed)
[facility] PROGRESS NOTE  Trevor Burch WGN:[id number] DOB: 1931-03-03 DOA: 02/07/2015 PCP: Farris Has, MD  Assessment/Plan: Principal Problem:  UTI (lower urinary tract infection) - Most likely cause of weakness. - awaiting urinary cultures  Active Problems:  Gastroesophageal reflux disease - Stable continue PPI   Hypertension  - holding blood pressures due to soft blood pressures originally   Acute on chronic diastolic heart failure - Patient is currently with softer blood pressures as such held BP rx's  Atrial fibrillation - Continue amiodarone - Most likely not on anticoagulation other than aspirin secondary to falls and weakness. At this juncture would not place on anticoagulation given generalized weakness and increased risk of falls - continue aspirin    Pleural effusion - Most likely cause of abnormal chest x-ray finding reported as pneumonia. Patient of note also has scarring and chronic atelectasis. He is not presenting with symptoms consistent with active pneumonia as such we'll not place on broader antibiotic regimen. Should hospital condition deteriorate will consider broadening antibiotic spectrum.   CRI (chronic renal insufficiency)Chronic kidney disease, stage IV (severe) - Currently at baseline - continuing home lasix regimen   Generalized weakness - Obtain physical therapy evaluation - Bed rest for now until physical therapy eval  Code Status: full Family Communication: d/c spouse at bedside. Disposition Plan: Pending PT evaluation and recommendations   Consultants:  none  Procedures:  None  Antibiotics:  Ceftriaxone  HPI/Subjective: Pt states he feels better. No new complaints otherwise  Objective: Filed Vitals:   02/08/15 1350  BP: 121/67  Pulse: 64  Temp: 98 F (36.7 C)  Resp: 20    Intake/Output Summary (Last 24 hours) at 02/08/15 1543 Last data filed at 02/08/15 0900  Gross per 24 hour  Intake 288.83 ml   Output    650 ml  Net -361.17 ml   Filed Weights   02/07/15 2011  Weight: 93.1 kg (205 lb 4 oz)    Exam:   General:  Pt in nad, alert and awake  Cardiovascular: no cyanosis or upper extremity edema  Respiratory: cta bl , no wheezes  Abdomen: Nd, nt, no guarding  Musculoskeletal: no cyanosis or clubbing on limited exam   Data Reviewed: Basic Metabolic Panel:  Recent Labs Lab 02/07/15 1539 02/08/15 0547  NA 137 142  K 4.5 4.6  CL 109 111  CO2 19 21  GLUCOSE 109* 94  BUN 74* 76*  CREATININE 4.24* 4.28*  CALCIUM 9.4 9.6  MG 2.7*  --   PHOS 6.0*  --    Liver Function Tests:  Recent Labs Lab 02/07/15 1539  AST 18  ALT 17  ALKPHOS 81  BILITOT 0.6  PROT 6.5  ALBUMIN 2.9*   No results for input(s): LIPASE, AMYLASE in the last 168 hours. No results for input(s): AMMONIA in the last 168 hours. CBC:  Recent Labs Lab 02/07/15 1539 02/08/15 0547  WBC 7.2 6.5  HGB 10.9* 9.9*  HCT 33.6* 31.1*  MCV 104.0* 105.4*  PLT 291 264   Cardiac Enzymes: No results for input(s): CKTOTAL, CKMB, CKMBINDEX, TROPONINI in the last 168 hours. BNP (last 3 results) No results for input(s): BNP in the last 8760 hours.  ProBNP (last 3 results)  Recent Labs  11/06/14 1026  PROBNP 316.0*    CBG: No results for input(s): GLUCAP in the last 168 hours.  No results found for this or any previous visit (from the past 240 hour(s)).   Studies: Dg Chest 2 View  02/07/2015  CLINICAL DATA:  Weakness  EXAM: CHEST  2 VIEW  COMPARISON:  01/16/2015  FINDINGS: Cardiac shadow is at the upper limits of normal in size. Small bilateral pleural effusions are noted worse than that seen on the prior study. Left lower lobe infiltrate is noted associated with the effusion.  IMPRESSION: Left lower lobe pneumonia with bilateral effusions   Electronically Signed   By: Alcide CleverMark  Lukens M.D.   On: 02/07/2015 16:22    Scheduled Meds: . amiodarone  200 mg Oral Daily  . aspirin EC  81 mg Oral Daily   . brimonidine  1 drop Left Eye BID  . cefTRIAXone (ROCEPHIN)  IV  1 g Intravenous Q24H  . dorzolamide-timolol  1 drop Left Eye BID  . [START ON 02/09/2015] furosemide  80 mg Oral BID  . heparin  5,000 Units Subcutaneous 3 times per day  . multivitamin with minerals  1 tablet Oral Daily  . pantoprazole  40 mg Oral Daily  . sodium chloride  3 mL Intravenous Q12H  . sodium chloride  3 mL Intravenous Q12H   Continuous Infusions:   Principal Problem:   UTI (lower urinary tract infection) Active Problems:   Gastroesophageal reflux disease   Hypertension   Chronic kidney disease, stage IV (severe)   Acute on chronic diastolic heart failure   Pleural effusion   CRI (chronic renal insufficiency)   Generalized weakness    Time spent: > 35 minutes    Penny PiaVEGA, Lakeem Rozo  [facility] Pager [phone] If 7PM-7AM, please contact night-coverage at [email or web address], password [email or web address] 02/08/2015, 3:43 PM  LOS: 1 day

## 2015-02-08 NOTE — Evaluation (Signed)
Physical Therapy Evaluation Patient Details Name: Trevor Burch MRN: [id number] DOB: March 03, 1931 Today's Date: 02/08/2015   History of Present Illness  81 yo male admitted 02/07/15 With history of atrial fibrillation , hypertension, GERD, chronic kidney disease, chronic diastolic heart failure  Who presents to the hospital complaining of generalized weakness, SOB  UTI found. Has indwelling catheter..  Clinical Impression  Patient is very weak, ambulated  X 44' with mod assist for balance. Patient's sats on RA 96%, HR 115 with activity.  Patient will benefit from PT to address problems listed in note below.patient may benefit from short rehab if agreeable and r=continues to be  Weak and unsteady.    Follow Up Recommendations SNF;Supervision/Assistance - 24 hour (wife not present  to discuss DC plan, patient currently will need assist for  ambulation.)    Equipment Recommendations  None recommended by PT    Recommendations for Other Services       Precautions / Restrictions Precautions Precautions: Fall      Mobility  Bed Mobility Overal bed mobility: Needs Assistance Bed Mobility: Supine to Sit     Supine to sit: HOB elevated;Mod assist     General bed mobility comments: use of rail, assist for trunk into upright, patient noted to struggle to sit up.  Transfers Overall transfer level: Needs assistance Equipment used: Rolling walker (2 wheeled) Transfers: Sit to/from Stand Sit to Stand: Mod assist;From elevated surface         General transfer comment: support to power up to stand, steady assist once standing, noted  shking in legs, knees flexed.  Ambulation/Gait Ambulation/Gait assistance: Mod assist Ambulation Distance (Feet): 40 Feet Assistive device: Rolling walker (2 wheeled) Gait Pattern/deviations: Step-through pattern;Decreased stride length Gait velocity: slow   General Gait Details: patient self limiting with c/o feeling weak so returned to room.    Stairs            Wheelchair Mobility    Modified Rankin (Stroke Patients Only)       Balance Overall balance assessment: Needs assistance;History of Falls Sitting-balance support: No upper extremity supported;Feet supported Sitting balance-Leahy Scale: Good     Standing balance support: During functional activity;Bilateral upper extremity supported Standing balance-Leahy Scale: Poor                               Pertinent Vitals/Pain Pain Assessment: No/denies pain    Home Living Family/patient expects to be discharged to:: Private residence Living Arrangements: Spouse/significant other Available Help at Discharge: Family Type of Home: House Home Access: Stairs to enter Entrance Stairs-Rails: Right;Left   Home Layout: One level Home Equipment: Environmental consultant - 2 wheels      Prior Function Level of Independence: Independent         Comments: does not drive     Hand Dominance        Extremity/Trunk Assessment   Upper Extremity Assessment: Generalized weakness           Lower Extremity Assessment: Generalized weakness      Cervical / Trunk Assessment: Normal  Communication   Communication: HOH  Cognition Arousal/Alertness: Awake/alert Behavior During Therapy: WFL for tasks assessed/performed Overall Cognitive Status: Within Functional Limits for tasks assessed                      General Comments      Exercises        Assessment/Plan  PT Assessment    PT Diagnosis Difficulty walking;Generalized weakness   PT Problem List    PT Treatment Interventions     PT Goals (Current goals can be found in the Care Plan section) Acute Rehab PT Goals Patient Stated Goal: to get stronger PT Goal Formulation: With patient Time For Goal Achievement: 02/22/15 Potential to Achieve Goals: Good    Frequency     Barriers to discharge        Co-evaluation               End of Session Equipment Utilized During  Treatment: Gait belt Activity Tolerance: Patient limited by fatigue Patient left: in chair;with call bell/phone within reach;with nursing/sitter in room Nurse Communication: Mobility status         Time: 0840-0906 PT Time Calculation (min) (ACUTE ONLY): 26 min   Charges:   PT Evaluation $Initial PT Evaluation Tier I: 1 Procedure PT Treatments $Gait Training: 8-22 mins   PT G Codes:        Rada HayHill, Jenisse Vullo Elizabeth 02/08/2015, 9:37 AM Blanchard KelchKaren Rudransh Bellanca PT [phone]

## 2015-02-09 MED ORDER — AMLODIPINE BESYLATE 5 MG PO TABS
5.0000 mg | ORAL_TABLET | Freq: Two times a day (BID) | ORAL | Status: DC
  Administered 2015-02-09 – 2015-02-11 (×5): 5 mg via ORAL
  Filled 2015-02-09 (×6): qty 1

## 2015-02-09 NOTE — Progress Notes (Signed)
[facility] PROGRESS NOTE  Trevor Burch JXB:[id number] DOB: 06/16/1931 DOA: 02/07/2015 PCP: Farris HasMORROW, AARON, MD  Brief narrative: Patient is an 81 year old with history of hypertension, chronic kidney disease stage IV, reflux, history of diastolic heart failure. Who presented to the ED complaining of generalized weakness. Upon further evaluation patient was found to have UTI. Patient was so weak he states he was having trouble ambulating. Currently awaiting PT evaluation for possible placement  Assessment/Plan: Principal Problem:  UTI (lower urinary tract infection) - Most likely cause of weakness. - awaiting urinary cultures had to be reincubated for better growth.  Active Problems:  Gastroesophageal reflux disease - Stable continue PPI   Hypertension  - Given elevation in blood pressure we'll place back on amlodipine   Acute on chronic diastolic heart failure -  Compensated currently  Atrial fibrillation - Continue amiodarone - Most likely not on anticoagulation other than aspirin secondary to falls and weakness. At this juncture would not place on anticoagulation given generalized weakness and increased risk of falls - continue aspirin    Pleural effusion - Most likely cause of abnormal chest x-ray finding reported as pneumonia. Patient of note also has scarring and chronic atelectasis. He is not presenting with symptoms consistent with active pneumonia as such we'll not place on broader antibiotic regimen. Should hospital condition deteriorate will consider broadening antibiotic spectrum.   CRI (chronic renal insufficiency)Chronic kidney disease, stage IV (severe) - Currently at baseline - continuing home lasix regimen   Generalized weakness - Obtain physical therapy evaluation - PT eval pending  Code Status: full Family Communication: d/c spouse at bedside. Disposition Plan: Pending PT evaluation and recommendations, with continued improvement most likely DC  within the next one or 2 days to SNF   Consultants:  none  Procedures:  None  Antibiotics:  Ceftriaxone  HPI/Subjective: Patient was inquiring about having Foley removed. Supposedly was supposed to get this removed by urologist today. I have indicated that since he has active infection most likely this will also cause increase in inflammation as such patient should finish antibiotic regimen.  Objective: Filed Vitals:   02/09/15 1508  BP: 131/79  Pulse: 75  Temp: 97.6 F (36.4 C)  Resp: 20    Intake/Output Summary (Last 24 hours) at 02/09/15 1546 Last data filed at 02/09/15 1300  Gross per 24 hour  Intake    600 ml  Output    201 ml  Net    399 ml   Filed Weights   02/07/15 2011  Weight: 93.1 kg (205 lb 4 oz)    Exam:   General:  Pt in nad, alert and awake  Cardiovascular: no cyanosis or upper extremity edema  Respiratory: cta bl , no wheezes  Abdomen: Nd, nt, no guarding  Musculoskeletal: no cyanosis or clubbing on limited exam   Data Reviewed: Basic Metabolic Panel:  Recent Labs Lab 02/07/15 1539 02/08/15 0547  NA 137 142  K 4.5 4.6  CL 109 111  CO2 19 21  GLUCOSE 109* 94  BUN 74* 76*  CREATININE 4.24* 4.28*  CALCIUM 9.4 9.6  MG 2.7*  --   PHOS 6.0*  --    Liver Function Tests:  Recent Labs Lab 02/07/15 1539  AST 18  ALT 17  ALKPHOS 81  BILITOT 0.6  PROT 6.5  ALBUMIN 2.9*   No results for input(s): LIPASE, AMYLASE in the last 168 hours. No results for input(s): AMMONIA in the last 168 hours. CBC:  Recent Labs Lab 02/07/15 1539  02/08/15 0547  WBC 7.2 6.5  HGB 10.9* 9.9*  HCT 33.6* 31.1*  MCV 104.0* 105.4*  PLT 291 264   Cardiac Enzymes: No results for input(s): CKTOTAL, CKMB, CKMBINDEX, TROPONINI in the last 168 hours. BNP (last 3 results) No results for input(s): BNP in the last 8760 hours.  ProBNP (last 3 results)  Recent Labs  11/06/14 1026  PROBNP 316.0*    CBG: No results for input(s): GLUCAP in the  last 168 hours.  Recent Results (from the past 240 hour(s))  Culture, Urine     Status: None (Preliminary result)   Collection Time: 02/07/15  3:25 PM  Result Value Ref Range Status   Specimen Description URINE, CATHETERIZED  Final   Special Requests Normal  Final   Colony Count PENDING  Incomplete   Culture   Final    Culture reincubated for better growth Performed at [facility]    Report Status PENDING  Incomplete     Studies: Dg Chest 2 View  02/07/2015   CLINICAL DATA:  Weakness  EXAM: CHEST  2 VIEW  COMPARISON:  01/16/2015  FINDINGS: Cardiac shadow is at the upper limits of normal in size. Small bilateral pleural effusions are noted worse than that seen on the prior study. Left lower lobe infiltrate is noted associated with the effusion.  IMPRESSION: Left lower lobe pneumonia with bilateral effusions   Electronically Signed   By: Alcide Clever M.D.   On: 02/07/2015 16:22    Scheduled Meds: . amiodarone  200 mg Oral Daily  . amLODipine  5 mg Oral BID  . aspirin EC  81 mg Oral Daily  . brimonidine  1 drop Left Eye BID  . cefTRIAXone (ROCEPHIN)  IV  1 g Intravenous Q24H  . dorzolamide-timolol  1 drop Left Eye BID  . furosemide  80 mg Oral BID  . heparin  5,000 Units Subcutaneous 3 times per day  . multivitamin with minerals  1 tablet Oral Daily  . pantoprazole  40 mg Oral Daily  . sodium chloride  3 mL Intravenous Q12H  . sodium chloride  3 mL Intravenous Q12H   Continuous Infusions:   Principal Problem:   UTI (lower urinary tract infection) Active Problems:   Gastroesophageal reflux disease   Hypertension   Chronic kidney disease, stage IV (severe)   Acute on chronic diastolic heart failure   Pleural effusion   CRI (chronic renal insufficiency)   Generalized weakness    Time spent: > 35 minutes    Penny Pia  [facility] Pager [phone] If 7PM-7AM, please contact night-coverage at [email or web address], password [facility] 02/09/2015, 3:46 PM  LOS: 2 days

## 2015-02-09 NOTE — Clinical Social Work Placement (Signed)
Clinical Social Work Department CLINICAL SOCIAL WORK PLACEMENT NOTE 02/09/2015  Patient:  Joseph ArtMILLER,Kelven M  Account Number:  [id number] Admit date:  02/07/2015  Clinical Social Worker:  Robin SearingJANET Thuan Tippett, LCSWA  Date/time:  02/09/2015 01:53 PM  Clinical Social Work is seeking post-discharge placement for this patient at the following level of care:   SKILLED NURSING   (*CSW will update this form in Epic as items are completed)   02/09/2015  [facility]/family provided with [facility]'s list of facilities offering this level of care within the geographic area requested by the patient (or if unable, by the patient's family).  02/09/2015  Patient/family informed of their freedom to choose among providers that offer the needed level of care, that participate in Medicare, Medicaid or managed care program needed by the patient, have an available bed and are willing to accept the patient.  02/09/2015  Patient/family informed of [facility]' ownership interest in [facility], as well as of the fact that they are under no obligation to receive care at this facility.  PASARR submitted to EDS on 02/09/2015 PASARR number received on 02/09/2015  FL2 transmitted to all facilities in geographic area requested by pt/family on  02/09/2015 FL2 transmitted to all facilities within larger geographic area on   Patient informed that his/her managed care company has contracts with or will negotiate with  certain facilities, including the following:     Patient/family informed of bed offers received:   Patient chooses bed at  Physician recommends and patient chooses bed at    Patient to be transferred to  on   Patient to be transferred to facility by  Patient and family notified of transfer on  Name of family member notified:    The following physician request were entered in Epic:   Additional Comments:   Reece LevyJanet Deannie Resetar, MSW, Theresia MajorsLCSWA [phone]

## 2015-02-09 NOTE — Progress Notes (Addendum)
Pt was insisting on foley catheter being removed today. I paged on-call urologist, Dr. Vernie Ammonsttelin, with his concern. He called back to inform that we should definitely not remove foley and the patient should follow up with his urologist at [facility] (Dr. Marcine MatarStephen Dahlstedt). Dr. Vernie Ammonsttelin stated that the patient does have urinary retention and the patient usually self catheters 4 times a day at home. The foley was placed 4 weeks ago because the patient was having trouble self catheter at home because of multiple false passages.  I communicated this with the patient and his wife.   Their new concern is that the catheter needed to be changed because it had been 4 weeks since placement. I communicated that the foley would most likely have to be changed by the urologist.

## 2015-02-09 NOTE — Clinical Social Work Psychosocial (Signed)
Clinical Social Work Department BRIEF PSYCHOSOCIAL ASSESSMENT 02/09/2015  Patient:  Trevor Burch, Trevor Burch     Account Number:  [id number]     Admit date:  02/07/2015  Clinical Social Worker:  Daiva Huge  Date/Time:  02/09/2015 01:35 PM  Referred by:  Physician  Date Referred:  02/09/2015 Referred for  SNF Placement   Other Referral:   Interview type:  Other - See comment Other interview type:   Met with patient and his wife of 41 years, Trevor Burch.    PSYCHOSOCIAL DATA Living Status:  FAMILY Admitted from facility:   Level of care:   Primary support name:  [name] Primary support relationship to patient:  FAMILY Degree of support available:   good    CURRENT CONCERNS Current Concerns  Post-Acute Placement   Other Concerns:    SOCIAL WORK ASSESSMENT / PLAN CSW met with patient and his wife to discuss possible SNF at d/c- both are in agreement with this plan and hopeful a short SNF stay will help him to regain strength and independence before going home.   "I walked a little ways". CSW advised them that his insurance will require SNF auth for d/c. CSW also provided them with a list of SNF' and they will review  and research to make any preferences known.  "we want a good one". CSW offered information on medicare.gov website' s  as well as encouraged wife to visit/tour any SNF's she wants to consider-   Assessment/plan status:  Other - See comment Other assessment/ plan:   FL2 and PASARR for SNF search   Information/referral to community resources:   SNF list    PATIENT'S/FAMILY'S RESPONSE TO PLAN OF CARE: Both patient and wife seem relieved to have had conversation with CSW about SNF- they are anxious to know what their options are- as well, CSW has advised them the insurance will have to authorize a SNF stay- he walked 82f mod assist-  Patient and wife appreciaitive of CSW visit and time and are hopeful he will get strong and be home soon-       JEduard Clos MSW, LLake Magdalene

## 2015-02-10 DIAGNOSIS — I5033 Acute on chronic diastolic (congestive) heart failure: Secondary | ICD-10-CM

## 2015-02-10 DIAGNOSIS — N184 Chronic kidney disease, stage 4 (severe): Secondary | ICD-10-CM

## 2015-02-10 LAB — CBC
HCT: 33.1 % — ABNORMAL LOW (ref 39.0–52.0)
HEMOGLOBIN: 10.7 g/dL — AB (ref 13.0–17.0)
MCH: 34 pg (ref 26.0–34.0)
MCHC: 32.3 g/dL (ref 30.0–36.0)
MCV: 105.1 fL — ABNORMAL HIGH (ref 78.0–100.0)
Platelets: 305 10*3/uL (ref 150–400)
RBC: 3.15 MIL/uL — ABNORMAL LOW (ref 4.22–5.81)
RDW: 13.7 % (ref 11.5–15.5)
WBC: 7.8 10*3/uL (ref 4.0–10.5)

## 2015-02-10 LAB — URINALYSIS, ROUTINE W REFLEX MICROSCOPIC
Bilirubin Urine: NEGATIVE
GLUCOSE, UA: NEGATIVE mg/dL
Ketones, ur: NEGATIVE mg/dL
Nitrite: NEGATIVE
PROTEIN: 100 mg/dL — AB
Specific Gravity, Urine: 1.012 (ref 1.005–1.030)
UROBILINOGEN UA: 0.2 mg/dL (ref 0.0–1.0)
pH: 5 (ref 5.0–8.0)

## 2015-02-10 LAB — URINE CULTURE: SPECIAL REQUESTS: NORMAL

## 2015-02-10 LAB — BASIC METABOLIC PANEL
Anion gap: 10 (ref 5–15)
BUN: 72 mg/dL — AB (ref 6–23)
CO2: 21 mmol/L (ref 19–32)
CREATININE: 4.43 mg/dL — AB (ref 0.50–1.35)
Calcium: 9.6 mg/dL (ref 8.4–10.5)
Chloride: 109 mmol/L (ref 96–112)
GFR calc Af Amer: 13 mL/min — ABNORMAL LOW (ref >90)
GFR, EST NON AFRICAN AMERICAN: 11 mL/min — AB (ref >90)
Glucose, Bld: 130 mg/dL — ABNORMAL HIGH (ref 70–99)
POTASSIUM: 4.3 mmol/L (ref 3.5–5.1)
Sodium: 140 mmol/L (ref 135–145)

## 2015-02-10 LAB — URINE MICROSCOPIC-ADD ON

## 2015-02-10 MED ORDER — FUROSEMIDE 10 MG/ML IJ SOLN
40.0000 mg | Freq: Once | INTRAMUSCULAR | Status: AC
  Administered 2015-02-10: 40 mg via INTRAVENOUS
  Filled 2015-02-10: qty 4

## 2015-02-10 NOTE — Consult Note (Signed)
I have been asked to see the patient by Dr. Thad Rangeripudeep Rai, for evaluation and management of urinary retention.  History of present illness: 81 year old male who was admitted to the hospital presumably for a urinary tract infection, this was manifest as lower extremity weakness.  He denies any confusion.  He denies any fevers or chills.  He denies any bladder pain or suprapubic pain.  He has not had any flank pain.  The patient has a complicated urologic history.  He has had long-standing bladder outlet obstruction and had several surgeries to help reduce his obstruction.  He has developed chronic renal insufficiency and is followed by nephrology for this.  Up until recently, the patient had been managing his bladder with clean intermittent catheterization.  This is been complicated because he has an anterior urethral stricture.  Last month the patient had difficulty catheterizing himself and created several false passages.  The result was that he needed to be seen in urology clinic and a catheter placed using a cystoscope.  That was one month ago, the patient was scheduled to have his catheter changed or removed today in clinic.  The patient has requested urology consultation to advise whether or not it is wise to remove the patient's catheter.  Review of systems: A 12 point comprehensive review of systems was obtained and is negative unless otherwise stated in the history of present illness.  Patient Active Problem List   Diagnosis Date Noted  . CRI (chronic renal insufficiency) 02/07/2015  . UTI (lower urinary tract infection) 02/07/2015  . Generalized weakness 02/07/2015  . On amiodarone therapy 12/01/2014  . Cough 11/07/2014  . Pleural effusion 11/06/2014  . Dyspnea 11/06/2014  . Acute on chronic diastolic heart failure 10/31/2013  . Long term current use of anticoagulant therapy 10/09/2013    Class: Chronic  . Chronic diastolic heart failure 10/09/2013    Class: Chronic  . Community acquired  pneumonia 09/30/2013  . Chest pain 09/30/2013  . Gastroesophageal reflux disease 09/30/2013  . Hypertension 09/30/2013  . Chronic kidney disease, stage IV (severe) 09/30/2013    No current facility-administered medications on file prior to encounter.   Current Outpatient Prescriptions on File Prior to Encounter  Medication Sig Dispense Refill  . amiodarone (PACERONE) 200 MG tablet Take 200 mg by mouth daily.    Marland Kitchen. amLODipine (NORVASC) 5 MG tablet Take 5 mg by mouth 2 (two) times daily.    Marland Kitchen. aspirin EC 81 MG tablet Take 1 tablet (81 mg total) by mouth daily.    . brimonidine (ALPHAGAN) 0.2 % ophthalmic solution Place 1 drop into the left eye 2 (two) times daily.    . cephALEXin (KEFLEX) 250 MG capsule Take 250 mg by mouth daily.    . CHOLINE PO Take 60 mg by mouth daily.     . clindamycin (CLEOCIN) 150 MG capsule Take 1 capsule by mouth 4 (four) times daily.  0  . dexlansoprazole (DEXILANT) 60 MG capsule Take 60 mg by mouth daily.    . diclofenac sodium (VOLTAREN) 1 % GEL Apply 2 g topically 3 (three) times daily as needed (inflammation/pain).    . Dorzolamide HCl-Timolol Mal PF 22.3-6.8 MG/ML SOLN Place 1 drop into the left eye 2 (two) times daily.    . furosemide (LASIX) 80 MG tablet Take 80 mg by mouth 2 (two) times daily.     . LUTEIN PO Take 2 mg by mouth daily.    . Multiple Vitamin (MULTIVITAMIN WITH MINERALS) TABS tablet Take 1  tablet by mouth daily.    . traMADol (ULTRAM) 50 MG tablet Take 50 mg by mouth every 6 (six) hours as needed for moderate pain.      Past Medical History  Diagnosis Date  . Kidney function abnormal     has approx. 20% function - stablized x several years  . GERD (gastroesophageal reflux disease)   . Hypertension   . Atrial fibrillation   . Community acquired pneumonia   . Hypercholesteremia   . Allergic rhinitis   . Chronic kidney disease     RENAL INSUFFICENCY   . Arthritis     BILATERAL KNEES  . CHF (congestive heart failure)   . Hepatitis      HX OF HEP B    Past Surgical History  Procedure Laterality Date  . Appendectomy    . Tonsillectomy    . Prostate surgery      for enlarged prostate and blockages - no cancer  . Cardioversion N/A 11/01/2013    Procedure: CARDIOVERSION;  Surgeon: Lesleigh Noe, MD;  Location: [facility] OR;  Service: Cardiovascular;  Laterality: N/A;    History  Substance Use Topics  . Smoking status: Never Smoker   . Smokeless tobacco: Never Used  . Alcohol Use: Yes     Comment: ocassionally    Family History  Problem Relation Age of Onset  . Heart disease Father     PE: Filed Vitals:   02/09/15 1508 02/09/15 2052 02/10/15 0557 02/10/15 1357  BP: 131/79 138/71 135/73 128/52  Pulse: 75 73 66 66  Temp: 97.6 F (36.4 C) 98.1 F (36.7 C) 97.9 F (36.6 C) 98.1 F (36.7 C)  TempSrc: Oral Oral Oral Oral  Resp: Height:      Weight:      SpO2: 96% 98% 100% 98%   Patient appears to be in no acute distress  patient is alert and oriented x3 Atraumatic normocephalic head No cervical or supraclavicular lymphadenopathy appreciated No increased work of breathing, no audible wheezes/rhonchi Regular sinus rhythm/rate Abdomen is soft, nontender, nondistended, no CVA or suprapubic tenderness Lower extremities are symmetric without appreciable edema Grossly neurologically intact  numerous skin lesions consistent with abrasions   Recent Labs  02/08/15 0547 02/10/15 1050  WBC 6.5 7.8  HGB 9.9* 10.7*  HCT 31.1* 33.1*    Recent Labs  02/08/15 0547 02/10/15 1050  NA 142 140  K 4.6 4.3  CL 111 109  CO2 21 21  GLUCOSE 94 130*  BUN 76* 72*  CREATININE 4.28* 4.43*  CALCIUM 9.6 9.6   No results for input(s): LABPT, INR in the last 72 hours. No results for input(s): LABURIN in the last 72 hours. Results for orders placed or performed during the hospital encounter of 02/07/15  Culture, Urine     Status: None   Collection Time: 02/07/15  3:25 PM  Result Value Ref Range Status    Specimen Description URINE, CATHETERIZED  Final   Special Requests Normal  Final   Colony Count   Final    >=100,000 COLONIES/ML Performed at [facility]    Culture   Final    Multiple bacterial morphotypes present, none predominant. Suggest appropriate recollection if clinically indicated. Performed at [facility]    Report Status 02/10/2015 FINAL  Final    Imaging: none  Imp: The patient has a neurogenic bladder and anterior urethral stricture which historically has been managed with clean intermittent catheterization over the past several  years.  He is admitted for presumable UTI, atypical presentation.  Recommendation: The catheter has been in long enough to heal the false passages.  It would be safe for the patient's catheter to be removed today.  The patient was willing to resume his clean intermittent catheterization.  Otherwise, the catheter should be changed.  I discussed this with he and his wife and both have agreed to resume clean intermittent catheterization.  This would be ideal, as it would reduce his risk of ongoing infection.  We will have the patient follow-up with me in 2 weeks to reevaluate his situation.  If the patient develops trouble catheterizing himself please contact us.  Otherwise, I will follow him on a when necessary basis.  Thank you for involving me in this patient's care, please page with any further questions or concerns. Berniece Salines W

## 2015-02-10 NOTE — Progress Notes (Signed)
[facility] has [facility] when stable for discharge.   Clinical Social Work Department CLINICAL SOCIAL WORK PLACEMENT NOTE 02/10/2015  Patient:  Trevor Burch,Trevor Burch  Account Number:  [id number] Admit date:  02/07/2015  Clinical Social Worker:  Robin SearingJANET CALDWELL, LCSWA  Date/time:  02/09/2015 01:53 PM  Clinical Social Work is seeking post-discharge placement for this patient at the following level of care:   SKILLED NURSING   (*CSW will update this form in Epic as items are completed)   02/09/2015  Patient/family provided with [facility] Department of Clinical Social Work's list of facilities offering this level of care within the geographic area requested by the patient (or if unable, by the patient's family).  02/09/2015  Patient/family informed of their freedom to choose among providers that offer the needed level of care, that participate in Medicare, Medicaid or managed care program needed by the patient, have an available bed and are willing to accept the patient.  02/09/2015  Patient/family informed of [facility]' ownership interest in [facility], as well as of the fact that they are under no obligation to receive care at this facility.  PASARR submitted to EDS on 02/09/2015 PASARR number received on 02/09/2015  FL2 transmitted to all facilities in geographic area requested by pt/family on  02/09/2015 FL2 transmitted to all facilities within larger geographic area on   Patient informed that his/her managed care company has contracts with or will negotiate with  certain facilities, including the following:     Patient/family informed of bed offers received:  02/09/2015 [facility] Physician recommends and patient chooses bed at    [facility]   Patient to be transferred to facility by  Patient and family notified of transfer on  Name of family member notified:     The following physician request were entered in Epic:   Additional Comments:      Lincoln MaxinKelly Styles Fambro, LCSW [facility] Clinical Social Worker cell #: [phone]

## 2015-02-10 NOTE — Progress Notes (Signed)
[facility]                                                                              Patient Demographics  Trevor Burch, is a 81 y.o. male, DOB - 02/03/1931, ZOX:[id number]  Admit date - 02/07/2015   Admitting Physician Penny Piarlando Vega, MD  Outpatient Primary MD for the patient is Farris HasMORROW, AARON, MD  LOS - 3   Chief Complaint  Patient presents with  . generalized weakness   . UTI symptoms        Brief HPI   Patient is a 81 year old male with history of atrial fibrillation not on anticoagulation, hypertension, GERD, chronic kidney disease, chronic diastolic heart failure. Patient presented with generalized weakness, progressively getting worse over the last several days.. The patient states he has had similar problems when he has developed urinary tract infections in the past. While in the ED patient had urinalysis suspicious for urinary tract infection. Also had chest x-ray reporting pneumonia although when compared to prior study patient has history of scarring and pleural effusion and left lung base. WBC within normal limits and lactic acid within normal limits.   Assessment & Plan    Principal Problem:  UTI (lower urinary tract infection) - Most likely cause of weakness. - Urine culture showed more than 100,000 colonies of multiple bacterial morphotypes. Continue IV Rocephin - Repeat UA after Foley catheter is discontinued today.  - Urology consulted today, patient was seen by Dr. Marlou PorchHerrick who recommended discontinuing indwelling Foley catheter, patient was due to have the Foley catheter removed outpatient today   Active Problems:  Gastroesophageal reflux disease - Stable continue PPI   Hypertension - Currently stable, on amlodipine   Acute on chronic diastolic heart failure, has 2+ pitting edema - Lasix restarted, will give extra dose of 40 mg IV Lasix 1   Atrial fibrillation - Continue amiodarone - Most likely not on anticoagulation other  than aspirin secondary to falls and weakness. At this juncture would not place on anticoagulation given generalized weakness and increased risk of falls - continue aspirin    Pleural effusion - Most likely cause of abnormal chest x-ray finding reported as pneumonia. Patient of note also has scarring and chronic atelectasis, no fevers or chills or coughing. Patient is closely followed by Dr. Sherene SiresWert outpatient for the same.   CRI (chronic renal insufficiency)Chronic kidney disease, stage IV (severe) - Currently at baseline, patient follows nephrology, Dr. Marisue HumbleSanford, per patient's wife, baseline creatinine is around 4.1 - continuing home lasix regimen   Generalized weakness - PT recommended skilled nursing facility for rehabilitation  Code Status: FC  Family Communication: Discussed in detail with the patient, all imaging results, lab results explained to the patient and wife   Disposition Plan: SNF likely in am    Time Spent in minutes   35 minutes  Procedures  Chest x-ray  Consults   Urology  DVT Prophylaxis  heparin   Medications  Scheduled Meds: . amiodarone  200 mg Oral Daily  . amLODipine  5 mg Oral BID  . aspirin EC  81 mg Oral Daily  . brimonidine  1 drop Left Eye BID  . cefTRIAXone (ROCEPHIN)  IV  1 g Intravenous Q24H  . dorzolamide-timolol  1 drop Left Eye BID  . furosemide  80 mg Oral BID  . heparin  5,000 Units Subcutaneous 3 times per day  . multivitamin with minerals  1 tablet Oral Daily  . pantoprazole  40 mg Oral Daily  . sodium chloride  3 mL Intravenous Q12H  . sodium chloride  3 mL Intravenous Q12H   Continuous Infusions:  PRN Meds:.sodium chloride, acetaminophen, albuterol, sodium chloride, traMADol   Antibiotics   Anti-infectives    Start     Dose/Rate Route Frequency Ordered Stop   02/08/15 1600  cefTRIAXone (ROCEPHIN) 1 g in dextrose 5 % 50 mL IVPB - Premix     1 g 100 mL/hr over 30 Minutes Intravenous Every 24 hours 02/07/15 2004      02/07/15 1645  cefTRIAXone (ROCEPHIN) 1 g in dextrose 5 % 50 mL IVPB     1 g 100 mL/hr over 30 Minutes Intravenous  Once 02/07/15 1635 02/07/15 1730        Subjective:   Trevor Burch was seen and examined today.   Patient denies dizziness, chest pain, shortness of breath, abdominal pain, N/V/D/C, new weakness, numbess, tingling. No acute events overnight.  + Peripheral edema  Objective:   Blood pressure 128/52, pulse 66, temperature 98.1 F (36.7 C), temperature source Oral, resp. rate 20, height  (1.854 m), weight 93.1 kg (205 lb 4 oz), SpO2 98 %.  Wt Readings from Last 3 Encounters:  02/07/15 93.1 kg (205 lb 4 oz)  01/16/15 93.895 kg (207 lb)  12/03/14 92.987 kg (205 lb)     Intake/Output Summary (Last 24 hours) at 02/10/15 1422 Last data filed at 02/10/15 0841  Gross per 24 hour  Intake    580 ml  Output   1525 ml  Net   -945 ml    Exam  General: Alert and oriented x 3, NAD  HEENT:  PERRLA, EOMI, Anicteic Sclera, mucous membranes moist.   Neck: Supple, no JVD, no masses  CVS: S1 S2 auscultated, no rubs, murmurs or gallops. Regular rate and rhythm.  Respiratory: Clear to auscultation bilaterally, no wheezing, rales or rhonchi  Abdomen: Soft, nontender, nondistended, + bowel sounds  Ext: no cyanosis clubbing, 2+ edema  Neuro: AAOx3, Cr N's II- XII. Strength 5/5 upper and lower extremities bilaterally  Skin: No rashes  Psych: Normal affect and demeanor, alert and oriented x3    Data Review   Micro Results Recent Results (from the past 240 hour(s))  Culture, Urine     Status: None   Collection Time: 02/07/15  3:25 PM  Result Value Ref Range Status   Specimen Description URINE, CATHETERIZED  Final   Special Requests Normal  Final   Colony Count   Final    >=100,000 COLONIES/ML Performed at [facility]    Culture   Final    Multiple bacterial morphotypes present, none predominant. Suggest appropriate recollection if clinically  indicated. Performed at [facility]    Report Status 02/10/2015 FINAL  Final    Radiology Reports Dg Chest 2 View  02/07/2015   CLINICAL DATA:  Weakness  EXAM: CHEST  2 VIEW  COMPARISON:  01/16/2015  FINDINGS: Cardiac shadow is at the upper limits of normal in size. Small bilateral pleural effusions are noted worse than that seen on the prior study. Left lower lobe infiltrate is noted associated with the effusion.  IMPRESSION: Left lower lobe pneumonia with bilateral effusions  Electronically Signed   By: Alcide Clever M.D.   On: 02/07/2015 16:22   Dg Chest 2 View  01/16/2015   CLINICAL DATA:  Pleural effusion.  EXAM: CHEST  2 VIEW  COMPARISON:  December 03, 2014.  February 26, 2009.  FINDINGS: Stable cardiomediastinal silhouette. No pneumothorax is noted. Stable calcified nodule is noted in left midlung which is unchanged compared to prior exam of 2010 and most consistent with benign granuloma. Right lung is clear. Stable blunting of left costophrenic sulcus is noted consistent with scarring or plural effusion. Left basilar opacity is noted concerning for subsegmental atelectasis or scarring. Bony thorax is intact.  IMPRESSION: Stable scarring or pleural effusion is seen in left lung base. Mildly increased left basilar opacity is noted most consistent with subsegmental atelectasis.   Electronically Signed   By: Lupita Raider, M.D.   On: 01/16/2015 13:34    CBC  Recent Labs Lab 02/07/15 1539 02/08/15 0547 02/10/15 1050  WBC 7.2 6.5 7.8  HGB 10.9* 9.9* 10.7*  HCT 33.6* 31.1* 33.1*  PLT 291 264 305  MCV 104.0* 105.4* 105.1*  MCH 33.7 33.6 34.0  MCHC 32.4 31.8 32.3  RDW 13.9 14.0 13.7    Chemistries   Recent Labs Lab 02/07/15 1539 02/08/15 0547 02/10/15 1050  NA 137 142 140  K 4.5 4.6 4.3  CL 109 111 109  CO2 GLUCOSE 109* 94 130*  BUN 74* 76* 72*  CREATININE 4.24* 4.28* 4.43*  CALCIUM 9.4 9.6 9.6  MG 2.7*  --   --   AST 18  --   --   ALT 17  --   --     ALKPHOS 81  --   --   BILITOT 0.6  --   --    ------------------------------------------------------------------------------------------------------------------ estimated creatinine clearance is 14.5 mL/min (by C-G formula based on Cr of 4.43). ------------------------------------------------------------------------------------------------------------------ No results for input(s): HGBA1C in the last 72 hours. ------------------------------------------------------------------------------------------------------------------ No results for input(s): CHOL, HDL, LDLCALC, TRIG, CHOLHDL, LDLDIRECT in the last 72 hours. ------------------------------------------------------------------------------------------------------------------  Recent Labs  02/07/15 1539  TSH 0.026*   ------------------------------------------------------------------------------------------------------------------ No results for input(s): VITAMINB12, FOLATE, FERRITIN, TIBC, IRON, RETICCTPCT in the last 72 hours.  Coagulation profile No results for input(s): INR, PROTIME in the last 168 hours.  No results for input(s): DDIMER in the last 72 hours.  Cardiac Enzymes No results for input(s): CKMB, TROPONINI, MYOGLOBIN in the last 168 hours.  Invalid input(s): CK ------------------------------------------------------------------------------------------------------------------ Invalid input(s): POCBNP  No results for input(s): GLUCAP in the last 72 hours.   Doshia Dalia M.D. [facility] 02/10/2015, 2:22 PM  Pager: [phone]   Between 7am to 7pm - call Pager - [phone]  After 7pm go to [email or web address] - password [email or web address]  Call night coverage person covering after 7pm

## 2015-02-10 NOTE — Progress Notes (Signed)
Physical Therapy Treatment Patient Details Name: Trevor Burch MRN: [id number] DOB: 03/23/1931 Today's Date: 02/10/2015    History of Present Illness 81 yo male admitted 02/07/15 With history of atrial fibrillation , hypertension, GERD, chronic kidney disease, chronic diastolic heart failure  Who presents to the hospital complaining of generalized weakness, SOB  UTI found. Has indwelling catheter..    PT Comments    Patient tolerated ambulation farther today, remains weak and with low endurance.  Follow Up Recommendations  SNF     Equipment Recommendations  None recommended by PT    Recommendations for Other Services       Precautions / Restrictions Precautions Precautions: Fall    Mobility  Bed Mobility                  Transfers Overall transfer level: Needs assistance Equipment used: Rolling walker (2 wheeled) Transfers: Sit to/from Stand Sit to Stand: Min assist         General transfer comment: cues for safety,use of UE's  Ambulation/Gait Ambulation/Gait assistance: Min assist Ambulation Distance (Feet): 150 Feet Assistive device: Rolling walker (2 wheeled) Gait Pattern/deviations: Step-to pattern;Step-through pattern;Decreased stride length;Trunk flexed     General Gait Details: cues for posture, stopped x 2 for standing rest break, reat arms and legs.   Stairs            Wheelchair Mobility    Modified Rankin (Stroke Patients Only)       Balance                                    Cognition Arousal/Alertness: Awake/alert                          Exercises      General Comments        Pertinent Vitals/Pain Pain Assessment: No/denies pain, sats 94 on  RA, HR90     Home Living                      Prior Function            PT Goals (current goals can now be found in the care plan section) Progress towards PT goals: Progressing toward goals    Frequency  Min 3X/week    PT Plan  Current plan remains appropriate    Co-evaluation             End of Session Equipment Utilized During Treatment: Gait belt Activity Tolerance: Patient tolerated treatment well Patient left: in chair;with call bell/phone within reach;with family/visitor present     Time: 1035-1050 PT Time Calculation (min) (ACUTE ONLY): 15 min  Charges:  $Gait Training: 8-22 mins                    G Codes:      Rada HayHill, Keoni Havey Elizabeth 02/10/2015, 12:27 PM Blanchard KelchKaren Gino Garrabrant PT [phone]

## 2015-02-10 NOTE — Progress Notes (Signed)
Foley catheter discontinued per MD's orders,patient tolerated procedure well.

## 2015-02-11 DIAGNOSIS — I10 Essential (primary) hypertension: Secondary | ICD-10-CM

## 2015-02-11 DIAGNOSIS — J90 Pleural effusion, not elsewhere classified: Secondary | ICD-10-CM

## 2015-02-11 DIAGNOSIS — R531 Weakness: Secondary | ICD-10-CM

## 2015-02-11 LAB — BASIC METABOLIC PANEL
Anion gap: 10 (ref 5–15)
BUN: 77 mg/dL — ABNORMAL HIGH (ref 6–23)
CHLORIDE: 109 mmol/L (ref 96–112)
CO2: 21 mmol/L (ref 19–32)
Calcium: 9.6 mg/dL (ref 8.4–10.5)
Creatinine, Ser: 4.33 mg/dL — ABNORMAL HIGH (ref 0.50–1.35)
GFR calc Af Amer: 13 mL/min — ABNORMAL LOW (ref >90)
GFR, EST NON AFRICAN AMERICAN: 12 mL/min — AB (ref >90)
GLUCOSE: 90 mg/dL (ref 70–99)
POTASSIUM: 4.2 mmol/L (ref 3.5–5.1)
SODIUM: 140 mmol/L (ref 135–145)

## 2015-02-11 MED ORDER — WITCH HAZEL-GLYCERIN EX PADS: MEDICATED_PAD | CUTANEOUS | Status: AC

## 2015-02-11 MED ORDER — HYDROCORTISONE ACETATE 25 MG RE SUPP: 25.0000 mg | Freq: Two times a day (BID) | RECTAL | Status: AC

## 2015-02-11 MED ORDER — CIPROFLOXACIN HCL 250 MG PO TABS
250.0000 mg | ORAL_TABLET | Freq: Every day | ORAL | Status: AC
Start: 2015-02-11 — End: ?

## 2015-02-11 MED ORDER — CIPROFLOXACIN HCL 250 MG PO TABS
250.0000 mg | ORAL_TABLET | Freq: Every day | ORAL | Status: DC
  Administered 2015-02-11: 250 mg via ORAL
  Filled 2015-02-11: qty 1

## 2015-02-11 MED ORDER — TRAMADOL HCL 50 MG PO TABS: 50.0000 mg | ORAL_TABLET | Freq: Four times a day (QID) | ORAL | Status: AC | PRN

## 2015-02-11 MED ORDER — FUROSEMIDE 10 MG/ML IJ SOLN
40.0000 mg | Freq: Once | INTRAMUSCULAR | Status: AC
  Administered 2015-02-11: 40 mg via INTRAVENOUS
  Filled 2015-02-11: qty 4

## 2015-02-11 MED ORDER — HYDROCORTISONE ACETATE 25 MG RE SUPP
25.0000 mg | Freq: Two times a day (BID) | RECTAL | Status: DC
  Administered 2015-02-11: 25 mg via RECTAL
  Filled 2015-02-11 (×3): qty 1

## 2015-02-11 MED ORDER — HYDROCORTISONE ACETATE 25 MG RE SUPP
25.0000 mg | Freq: Two times a day (BID) | RECTAL | Status: DC
Start: 2015-02-11 — End: 2015-02-11

## 2015-02-11 MED ORDER — WITCH HAZEL-GLYCERIN EX PADS
MEDICATED_PAD | CUTANEOUS | Status: AC
  Administered 2015-02-11: 14:00:00 via TOPICAL
  Filled 2015-02-11: qty 100

## 2015-02-11 NOTE — Discharge Summary (Addendum)
Physician Discharge Summary   Patient ID: Trevor Burch MRN: [id number] DOB/AGE: 79/06/1931 79 y.o.  Admit date: 02/07/2015 Discharge date: 02/11/2015  Primary Care Physician:  Farris HasMORROW, AARON, MD  Discharge Diagnoses:       Urinary tract infection  . CRI (chronic renal insufficiency) . Chronic kidney disease, stage IV (severe) . Hypertension . Acute on chronic diastolic heart failure . chronic Pleural effusion . Gastroesophageal reflux disease  Consults:  Urology, Dr. Marlou PorchHerrick   Recommendations for Outpatient Follow-up:  Straight cath as needed  New medication ciprofloxacin 250 mg q24 hours x 4 days  TESTS THAT NEED FOLLOW-UP BMET in 1 week   DIET: Heart healthy diet    Allergies:  No Known Allergies   Discharge Medications:   Medication List    STOP taking these medications        cephALEXin 250 MG capsule  Commonly known as:  KEFLEX     clindamycin 150 MG capsule  Commonly known as:  CLEOCIN      TAKE these medications        acetaminophen 325 MG tablet  Commonly known as:  TYLENOL  Take 650 mg by mouth every 6 (six) hours as needed for moderate pain.     amiodarone 200 MG tablet  Commonly known as:  PACERONE  Take 200 mg by mouth daily.     amLODipine 5 MG tablet  Commonly known as:  NORVASC  Take 5 mg by mouth 2 (two) times daily.     aspirin EC 81 MG tablet  Take 1 tablet (81 mg total) by mouth daily.     brimonidine 0.2 % ophthalmic solution  Commonly known as:  ALPHAGAN  Place 1 drop into the left eye 2 (two) times daily.     CHOLINE PO  Take 60 mg by mouth daily.     ciprofloxacin 250 MG tablet  Commonly known as:  CIPRO  Take 1 tablet (250 mg total) by mouth daily. x4 days     dexlansoprazole 60 MG capsule  Commonly known as:  DEXILANT  Take 60 mg by mouth daily.     diclofenac sodium 1 % Gel  Commonly known as:  VOLTAREN  Apply 2 g topically 3 (three) times daily as needed (inflammation/pain).     Dorzolamide HCl-Timolol  Mal PF 22.3-6.8 MG/ML Soln  Place 1 drop into the left eye 2 (two) times daily.     furosemide 80 MG tablet  Commonly known as:  LASIX  Take 80 mg by mouth 2 (two) times daily.     LUTEIN PO  Take 2 mg by mouth daily.     multivitamin with minerals Tabs tablet  Take 1 tablet by mouth daily.     OVER THE COUNTER MEDICATION  Take 1 tablet by mouth 2 (two) times daily. Joint [facility]     PROAIR HFA 108 (90 BASE) MCG/ACT inhaler  Generic drug:  albuterol  Inhale 2 puffs into the lungs every 4 (four) hours as needed for wheezing.     traMADol 50 MG tablet  Commonly known as:  ULTRAM  Take 1 tablet (50 mg total) by mouth every 6 (six) hours as needed for moderate pain.         Brief H and P: For complete details please refer to admission H and P, but in brief Patient is a 81 year old male with history of atrial fibrillation not on anticoagulation, hypertension, GERD, chronic kidney disease, chronic diastolic heart failure. Patient presented with generalized weakness,  progressively getting worse over the last several days.. The patient states he has had similar problems when he has developed urinary tract infections in the past. While in the ED patient had urinalysis suspicious for urinary tract infection. Also had chest x-ray reporting pneumonia although when compared to prior study patient has history of scarring and pleural effusion and left lung base. WBC within normal limits and lactic acid within normal limits.  Hospital Course:   UTI (lower urinary tract infection) - Most likely cause of weakness. The patient was placed on IV Rocephin. Urine culture showed more than 100,000 colonies of multiple bacterial morphotypes. Indwelling Foley catheter was removed by urology. Patient will continue clean intermittent catheterization as needed. Patient will follow-up with Dr. Marlou Porch in 2 weeks. UA was repeated after the Foley catheter was removed, please follow urine culture and sensitivities. He  was placed on oral ciprofloxacin for another 4 days to complete a full course.   Gastroesophageal reflux disease - Stable continue PPI   Hypertension - Currently stable, on amlodipine   Acute on chronic diastolic heart failure, has 2+ pitting edema, no shortness of breath however patient is on high-dose Lasix at home which was initially held on admission. - Lasix restarted at home dose, patient was given an extra dose of extra dose of 40 mg IV Lasix   Atrial fibrillation - Continue amiodarone - Most likely not on anticoagulation other than aspirin secondary to falls and weakness. At this juncture would not place on anticoagulation given generalized weakness and increased risk of falls - continue aspirin    Chronic Pleural effusion - Most likely cause of abnormal chest x-ray finding reported as pneumonia. Patient of note also has scarring and chronic atelectasis, no fevers or chills or coughing. Patient is closely followed by Dr. Sherene Sires outpatient for the same.    CRI (chronic renal insufficiency)Chronic kidney disease, stage IV (severe) Currently close to baseline, patient follows nephrology, Dr. Marisue Humble, per patient's wife, baseline creatinine is around 4.1 continuing home lasix regimen and follow-up with Dr. Marisue Humble in 2-3 weeks.  External hemorrhoids - noticed today prior to discharge as patient reported pain and slight bleeding in the anal area. Placed on anusol suppositories BID and Tucks pads. Patient recommended to follow-up with outpatient surgery for further management. Recommended to avoid constipation.   Generalized weakness - PT recommended skilled nursing facility for rehabilitation   Day of Discharge BP 126/64 mmHg  Pulse 69  Temp(Src) 97.4 F (36.3 C) (Oral)  Resp 18  Ht  (1.854 m)  Wt 93.1 kg (205 lb 4 oz)  BMI 27.09 kg/m2  SpO2 97%  Physical Exam: General: Alert and awake oriented x3 not in any acute distress. CVS: S1-S2 clear no murmur rubs or  gallops Chest: clear to auscultation bilaterally, no wheezing rales or rhonchi Abdomen: soft nontender, nondistended, normal bowel sounds Extremities: no cyanosis, clubbing, 1-2+ edema noted bilaterally Neuro: Cranial nerves II-XII intact, no focal neurological deficits Anal area: skin tags with external hemorrhoids   The results of significant diagnostics from this hospitalization (including imaging, microbiology, ancillary and laboratory) are listed below for reference.    LAB RESULTS: Basic Metabolic Panel:  Recent Labs Lab 02/07/15 1539  02/10/15 1050 02/11/15 0540  NA 137  < > 140 140  K 4.5  < > 4.3 4.2  CL 109  < > 109 109  CO2 19  < > 21 21  GLUCOSE 109*  < > 130* 90  BUN 74*  < > 72*  77*  CREATININE 4.24*  < > 4.43* 4.33*  CALCIUM 9.4  < > 9.6 9.6  MG 2.7*  --   --   --   PHOS 6.0*  --   --   --   < > = values in this interval not displayed. Liver Function Tests:  Recent Labs Lab 02/07/15 1539  AST 18  ALT 17  ALKPHOS 81  BILITOT 0.6  PROT 6.5  ALBUMIN 2.9*   No results for input(s): LIPASE, AMYLASE in the last 168 hours. No results for input(s): AMMONIA in the last 168 hours. CBC:  Recent Labs Lab 02/08/15 0547 02/10/15 1050  WBC 6.5 7.8  HGB 9.9* 10.7*  HCT 31.1* 33.1*  MCV 105.4* 105.1*  PLT 264 305   Cardiac Enzymes: No results for input(s): CKTOTAL, CKMB, CKMBINDEX, TROPONINI in the last 168 hours. BNP: Invalid input(s): POCBNP CBG: No results for input(s): GLUCAP in the last 168 hours.  Significant Diagnostic Studies:  Dg Chest 2 View  02/07/2015   CLINICAL DATA:  Weakness  EXAM: CHEST  2 VIEW  COMPARISON:  01/16/2015  FINDINGS: Cardiac shadow is at the upper limits of normal in size. Small bilateral pleural effusions are noted worse than that seen on the prior study. Left lower lobe infiltrate is noted associated with the effusion.  IMPRESSION: Left lower lobe pneumonia with bilateral effusions   Electronically Signed   By: Alcide Clever  M.D.   On: 02/07/2015 16:22    2D ECHO:   Disposition and Follow-up: Discharge Instructions    Diet - low sodium heart healthy    Complete by:  As directed      Increase activity slowly    Complete by:  As directed             DISPOSITION: SNF   DISCHARGE FOLLOW-UP     Follow-up Information    Follow up with Farris Has, MD On 02/25/2015.   Specialty:  Family Medicine   Why:  Please follow up with Dr. Kateri Plummer on April 6 @ 3:15   Contact information:   [address] [place] [address] [phone]       Follow up with Crist Fat, MD On 03/02/2015.   Specialty:  Urology   Why:  Please follow up with Dr. Marlou Porch on April 11 :30pm.   Contact information:   [address] [place] [address] [phone]        Time spent on Discharge: 35 mins   Signed:   Wymon Swaney M.D. [facility] 02/11/2015, 10:23 AM Pager: [phone]

## 2015-02-11 NOTE — Progress Notes (Signed)
Patient d/c from hospital, accompany by wife and EMS.

## 2015-02-11 NOTE — Progress Notes (Signed)
Pharmacy is asked to adjust Cipro. Dose is appropriate as ordered by the MD: 250mg  PO q24h. We will follow in the background.   Charolotte Ekeom Makenah Karas, PharmD, pager [phone]. 02/11/2015,10:08 AM.

## 2015-02-11 NOTE — Progress Notes (Signed)
Patient is set to [facility] today. Patient & wife, Steward DroneBrenda at bedside aware. Discharge packet given to RN, Doris. [facility] called for transport to pickup at 2:00pm.  Clinical Social Work Department CLINICAL SOCIAL WORK PLACEMENT NOTE 02/11/2015  Patient:  Trevor Burch,Trevor Burch  Account Number:  [id number] Admit date:  02/07/2015  Clinical Social Worker:  Robin SearingJANET CALDWELL, LCSWA  Date/time:  02/09/2015 01:53 PM  Clinical Social Work is seeking post-discharge placement for this patient at the following level of care:   SKILLED NURSING   (*CSW will update this form in Epic as items are completed)   02/09/2015  Patient/family provided with [facility] Department of Clinical Social Work's list of facilities offering this level of care within the geographic area requested by the patient (or if unable, by the patient's family).  02/09/2015  Patient/family informed of their freedom to choose among providers that offer the needed level of care, that participate in Medicare, Medicaid or managed care program needed by the patient, have an available bed and are willing to accept the patient.  02/09/2015  Patient/family informed of [facility]' ownership interest in [facility], as well as of the fact that they are under no obligation to receive care at this facility.  PASARR submitted to EDS on 02/09/2015 PASARR number received on 02/09/2015  FL2 transmitted to all facilities in geographic area requested by pt/family on  02/09/2015 FL2 transmitted to all facilities within larger geographic area on   Patient informed that his/her managed care company has contracts with or will negotiate with  certain facilities, including the following:     Patient/family informed of bed offers received:  02/09/2015 [facility] Physician recommends and patient chooses bed at    [facility]  02/11/2015 Patient  to be transferred to facility by [facility] Patient and family notified of transfer on 02/11/2015 Name of family member notified:  patient's wife, Steward DroneBrenda at bedside  The following physician request were entered in Epic:   Additional Comments:    Lincoln MaxinKelly Maggie Dworkin, LCSW [facility] Clinical Social Worker cell #: [phone]

## 2015-02-11 NOTE — Progress Notes (Signed)
Report called to Brunetta GeneraAnnie RN at [facility] @ [phone].

## 2015-02-12 LAB — URINE CULTURE: Colony Count: 7000

## 2015-02-16 ENCOUNTER — Other Ambulatory Visit: Payer: Self-pay | Admitting: Interventional Cardiology

## 2015-02-23 ENCOUNTER — Other Ambulatory Visit: Payer: Self-pay | Admitting: Interventional Cardiology

## 2015-02-28 ENCOUNTER — Encounter (HOSPITAL_COMMUNITY): Payer: Self-pay | Admitting: *Deleted

## 2015-02-28 ENCOUNTER — Emergency Department (HOSPITAL_COMMUNITY)
Admission: EM | Admit: 2015-02-28 | Discharge: 2015-02-28 | Disposition: A | Discharge status: Home / Self Care | Payer: Medicare Other | Attending: Emergency Medicine | Admitting: Emergency Medicine

## 2015-02-28 DIAGNOSIS — Z8701 Personal history of pneumonia (recurrent): Secondary | ICD-10-CM | POA: Insufficient documentation

## 2015-02-28 DIAGNOSIS — M1990 Unspecified osteoarthritis, unspecified site: Secondary | ICD-10-CM | POA: Diagnosis not present

## 2015-02-28 DIAGNOSIS — I129 Hypertensive chronic kidney disease with stage 1 through stage 4 chronic kidney disease, or unspecified chronic kidney disease: Secondary | ICD-10-CM | POA: Diagnosis not present

## 2015-02-28 DIAGNOSIS — I509 Heart failure, unspecified: Secondary | ICD-10-CM | POA: Diagnosis not present

## 2015-02-28 DIAGNOSIS — Z7982 Long term (current) use of aspirin: Secondary | ICD-10-CM | POA: Insufficient documentation

## 2015-02-28 DIAGNOSIS — N189 Chronic kidney disease, unspecified: Secondary | ICD-10-CM | POA: Insufficient documentation

## 2015-02-28 DIAGNOSIS — Z8639 Personal history of other endocrine, nutritional and metabolic disease: Secondary | ICD-10-CM | POA: Insufficient documentation

## 2015-02-28 DIAGNOSIS — I4891 Unspecified atrial fibrillation: Secondary | ICD-10-CM | POA: Diagnosis not present

## 2015-02-28 DIAGNOSIS — R339 Retention of urine, unspecified: Secondary | ICD-10-CM | POA: Diagnosis present

## 2015-02-28 DIAGNOSIS — Z792 Long term (current) use of antibiotics: Secondary | ICD-10-CM | POA: Diagnosis not present

## 2015-02-28 DIAGNOSIS — Z8709 Personal history of other diseases of the respiratory system: Secondary | ICD-10-CM | POA: Insufficient documentation

## 2015-02-28 DIAGNOSIS — Z8619 Personal history of other infectious and parasitic diseases: Secondary | ICD-10-CM | POA: Insufficient documentation

## 2015-02-28 DIAGNOSIS — K219 Gastro-esophageal reflux disease without esophagitis: Secondary | ICD-10-CM | POA: Insufficient documentation

## 2015-02-28 DIAGNOSIS — Z7952 Long term (current) use of systemic steroids: Secondary | ICD-10-CM | POA: Diagnosis not present

## 2015-02-28 DIAGNOSIS — Z79899 Other long term (current) drug therapy: Secondary | ICD-10-CM | POA: Insufficient documentation

## 2015-02-28 LAB — URINALYSIS, ROUTINE W REFLEX MICROSCOPIC
BILIRUBIN URINE: NEGATIVE
Glucose, UA: NEGATIVE mg/dL
KETONES UR: NEGATIVE mg/dL
NITRITE: NEGATIVE
Protein, ur: 30 mg/dL — AB
SPECIFIC GRAVITY, URINE: 1.01 (ref 1.005–1.030)
Urobilinogen, UA: 0.2 mg/dL (ref 0.0–1.0)
pH: 5 (ref 5.0–8.0)

## 2015-02-28 LAB — URINE MICROSCOPIC-ADD ON

## 2015-02-28 MED ORDER — LIDOCAINE HCL 1 % IJ SOLN
INTRAMUSCULAR | Status: AC
  Administered 2015-02-28: 2.1 mL
  Filled 2015-02-28: qty 20

## 2015-02-28 MED ORDER — CEFTRIAXONE SODIUM 1 G IJ SOLR
1.0000 g | INTRAMUSCULAR | Status: DC
  Administered 2015-02-28: 1 g via INTRAMUSCULAR
  Filled 2015-02-28: qty 10

## 2015-02-28 NOTE — Op Note (Signed)
[name], [name]               ACCOUNT NO.:  [id number]  MEDICAL RECORD NO.:  [id number]  LOCATION:  WA11                         [facility]  PHYSICIAN:  Excell SeltzerJohn J. Annabell HowellsWrenn, M.D.    DATE OF BIRTH:  [birth year]  DATE OF PROCEDURE:  02/28/2015 DATE OF DISCHARGE:                              OPERATIVE REPORT   Patient of Dr. Marisa Severinlga Otter.  PROCEDURE:  Cystoscopy with insertion of a Foley catheter.  PREOPERATIVE DIAGNOSIS:  History of urinary retention with urethral false passage.  POSTOPERATIVE DIAGNOSIS:  History of urinary retention with urethral false passage.  SURGEON:  Excell SeltzerJohn J. Annabell HowellsWrenn, M.D.  ANESTHESIA:  Local.  DRAINS:  An 18-French coude catheter.  BLOOD LOSS:  None.  COMPLICATIONS:  None.  INDICATIONS:  Mr. Trevor Burch is an 81 year old white male with a history of chronic retention.  He has been on self cath, but has been unable to cath for the last several hours and presented to the emergency room with abdominal pain and urinary retention.  He had attempts at Foley catheter placement including a 20-coude in the ER by Dr. Norlene Campbelltter and the staff without success.  FINDINGS AND PROCEDURE:  After consultation evaluation, he was left supine in the ER stretcher.  His penis was prepped with Betadine solution, and he was draped with sterile towels.  His urethra was then instilled with lubricating jelly, and an initial attempt for passage of an 18-French coude catheter and 14-French straight catheter were unsuccessful.  I then instilled the urethra with 2% lidocaine jelly and passed the flexible cystoscope.  The anterior urethra had some mild stricturing in the bulb.  There was some adherent clot what appeared to be a posterior false passage, but it was not well visualized because of the blood.  The external sphincter was intact, the prostatic urethra had bilobar hyperplasia with some evidence of prior resection and no evidence of bladder neck contracture or significant  prostatic obstruction. Examination of the bladder was difficult because the urine was cloudy, but no obvious lesions were identified.  A Sensor guidewire was passed through the cystoscope into the bladder, and the cystoscope was removed.  An 18-French Council catheter was then passed with some resistance into the bladder.  Once in the bladder, the balloon was filled with 10 mL of sterile fluid with return of urine and the guidewire was removed.  The catheter was placed to straight drainage, and the patient drained in excess of 1000 mL from the bladder. The urine was somewhat cloudy. Specimen was obtained and sent for culture.  He was given Rocephin 1 g after the procedure because of the appearance of the urine and his prior history of infection with an E. coli that was sensitive to Rocephin. There were no complications.     Excell SeltzerJohn J. Annabell HowellsWrenn, M.D.     JJW/MEDQ  D:  02/28/2015  T:  02/28/2015  Job:  [id number]

## 2015-02-28 NOTE — Discharge Instructions (Signed)
Acute Urinary Retention  Acute urinary retention is when you are unable to pee (urinate). Acute urinary retention is common in older men. Prostates can get bigger, which blocks the flow of pee.   HOME CARE  · Drink enough fluids to keep your pee clear or pale yellow.  · If you are sent home with a tube that drains the bladder (catheter), there will be a drainage bag attached to it. There are two types of bags. One is big that you can wear at night without having to empty it. One is smaller and needs to be emptied more often.  ¨ Keep the drainage bag empty.  ¨ Keep the drainage bag lower than your catheter.  · Only take medicine as told by your doctor.  GET HELP IF:  · You have a low-grade fever.  · You have spasms or you are leaking pee when you have spasms.  GET HELP RIGHT AWAY IF:   · You have chills or a fever.  · Your catheter stops draining pee.  · Your catheter falls out.  · You have increased bleeding that does not stop after you have rested and increased the amount of fluids you had been drinking.  MAKE SURE YOU:   · Understand these instructions.  · Will watch your condition.  · Will get help right away if you are not doing well or get worse.  Document Released: 04/25/2008 Document Revised: 08/28/2013 Document Reviewed: 04/18/2013  ExitCare® Patient Information ©2015 [employer]. This information is not intended to replace advice given to you by your health care provider. Make sure you discuss any questions you have with your health care provider.

## 2015-02-28 NOTE — ED Notes (Signed)
Bed: WA11 Expected date:  Expected time:  Means of arrival:  Comments: EMS urinary retention 

## 2015-02-28 NOTE — ED Notes (Signed)
Bladder scan showed ">92099ml" urine in bladder

## 2015-02-28 NOTE — ED Notes (Signed)
Pt here for urinary retention.  Pt voided last around 6pm last night (8 hours ago).  Pt also has sob when he lays flat (since the urinary retention began).

## 2015-02-28 NOTE — ED Notes (Signed)
Pt. Oxygen level 88% on room air. RN,Ellen made aware and pt. Placed on 2 liters 02.

## 2015-02-28 NOTE — ED Provider Notes (Signed)
CSN: [id number]     Arrival date & time 02/28/15  0158 History   First MD Initiated Contact with Patient 02/28/15 0159     Chief Complaint  Patient presents with  . Urinary Retention     (Consider location/radiation/quality/duration/timing/severity/associated sxs/prior Treatment) HPI 81 year old male presents to the emergency department from home with complaint of urinary retention.  Patient has history of bladder outlet obstruction and urethral stricture.  Normally he is able to self cath, but after 6 PM tonight, has not been able to pass a catheter.  He has attempted several times, called a nurse who came to home and attempted several times.  She recommended letting him relax and after an hour he tried again.  Patient required placement of a Foley catheter in February in the office with urology with cystoscope due to false passages made from his intermittent self catheterizations.  He had a catheter for a month, which was removed at the end of March.  Patient has had no difficulties until today. Past Medical History  Diagnosis Date  . Kidney function abnormal     has approx. 20% function - stablized x several years  . GERD (gastroesophageal reflux disease)   . Hypertension   . Atrial fibrillation   . Community acquired pneumonia   . Hypercholesteremia   . Allergic rhinitis   . Chronic kidney disease     RENAL INSUFFICENCY   . Arthritis     BILATERAL KNEES  . CHF (congestive heart failure)   . Hepatitis     HX OF HEP B   Past Surgical History  Procedure Laterality Date  . Appendectomy    . Tonsillectomy    . Prostate surgery      for enlarged prostate and blockages - no cancer  . Cardioversion N/A 11/01/2013    Procedure: CARDIOVERSION;  Surgeon: Lesleigh Noe, MD;  Location: [facility] OR;  Service: Cardiovascular;  Laterality: N/A;   Family History  Problem Relation Age of Onset  . Heart disease Father    History  Substance Use Topics  . Smoking status: Never Smoker   .  Smokeless tobacco: Never Used  . Alcohol Use: Yes     Comment: ocassionally    Review of Systems  See History of Present Illness; otherwise all other systems are reviewed and negative   Allergies  Review of patient's allergies indicates no known allergies.  Home Medications   Prior to Admission medications   Medication Sig Start Date End Date Taking? Authorizing Provider  acetaminophen (TYLENOL) 325 MG tablet Take 650 mg by mouth every 6 (six) hours as needed for moderate pain.    Historical Provider, MD  amiodarone (PACERONE) 200 MG tablet Take 200 mg by mouth daily.    Historical Provider, MD  amiodarone (PACERONE) 200 MG tablet TAKE 1 TABLET DAILY 02/23/15   Lyn Records, MD  amLODipine (NORVASC) 5 MG tablet Take 5 mg by mouth 2 (two) times daily.    Historical Provider, MD  amLODipine (NORVASC) 5 MG tablet TAKE 1 TABLET TWICE A DAY 02/18/15   Lyn Records, MD  aspirin EC 81 MG tablet Take 1 tablet (81 mg total) by mouth daily. 04/24/14   Lyn Records, MD  brimonidine (ALPHAGAN) 0.2 % ophthalmic solution Place 1 drop into the left eye 2 (two) times daily.    Historical Provider, MD  CHOLINE PO Take 60 mg by mouth daily.     Historical Provider, MD  ciprofloxacin (CIPRO) 250 MG tablet  Take 1 tablet (250 mg total) by mouth daily. x4 days 02/11/15   Ripudeep Jenna Luo, MD  dexlansoprazole (DEXILANT) 60 MG capsule Take 60 mg by mouth daily.    Historical Provider, MD  diclofenac sodium (VOLTAREN) 1 % GEL Apply 2 g topically 3 (three) times daily as needed (inflammation/pain).    Historical Provider, MD  Dorzolamide HCl-Timolol Mal PF 22.3-6.8 MG/ML SOLN Place 1 drop into the left eye 2 (two) times daily.    Historical Provider, MD  furosemide (LASIX) 80 MG tablet Take 80 mg by mouth 2 (two) times daily.  11/29/13   Scott Moishe Spice, PA-C  hydrocortisone (ANUSOL-HC) 25 MG suppository Place 1 suppository (25 mg total) rectally 2 (two) times daily. 02/11/15   Ripudeep Jenna Luo, MD  LUTEIN PO Take 2 mg by  mouth daily.    Historical Provider, MD  Multiple Vitamin (MULTIVITAMIN WITH MINERALS) TABS tablet Take 1 tablet by mouth daily.    Historical Provider, MD  OVER THE COUNTER MEDICATION Take 1 tablet by mouth 2 (two) times daily. Joint Care    Historical Provider, MD  PROAIR HFA 108 (90 BASE) MCG/ACT inhaler Inhale 2 puffs into the lungs every 4 (four) hours as needed for wheezing.  01/29/15   Historical Provider, MD  traMADol (ULTRAM) 50 MG tablet Take 1 tablet (50 mg total) by mouth every 6 (six) hours as needed for moderate pain. 02/11/15   Ripudeep Jenna Luo, MD  witch hazel-glycerin (TUCKS) pad Apply topically now. 02/11/15   Ripudeep K Rai, MD   BP 142/68 mmHg  Pulse 65  Resp 18  SpO2 88% Physical Exam  Constitutional: He is oriented to person, place, and time. He appears well-developed and well-nourished.  HENT:  Head: Normocephalic and atraumatic.  Nose: Nose normal.  Mouth/Throat: Oropharynx is clear and moist.  Eyes: Conjunctivae and EOM are normal. Pupils are equal, round, and reactive to light.  Neck: Normal range of motion. Neck supple. No JVD present. No tracheal deviation present. No thyromegaly present.  Cardiovascular: Normal rate, regular rhythm, normal heart sounds and intact distal pulses.  Exam reveals no gallop and no friction rub.   No murmur heard. Pulmonary/Chest: Effort normal and breath sounds normal. No stridor. No respiratory distress. He has no wheezes. He has no rales. He exhibits no tenderness.  Abdominal: Soft. Bowel sounds are normal. He exhibits distension. He exhibits no mass. There is no tenderness. There is no rebound and no guarding.  Patient has distention of his lower abdomen with tenderness.  Genitourinary:  Patient has abrasion to the top of his penis secondary to mechanical trauma recently.  Musculoskeletal: Normal range of motion. He exhibits no edema or tenderness.  Lymphadenopathy:    He has no cervical adenopathy.  Neurological: He is alert and  oriented to person, place, and time. He displays normal reflexes. He exhibits normal muscle tone. Coordination normal.  Skin: Skin is warm and dry. No rash noted. No erythema. No pallor.  Psychiatric: He has a normal mood and affect. His behavior is normal. Judgment and thought content normal.  Nursing note and vitals reviewed.   ED Course  BLADDER CATHETERIZATION Date/Time: 02/28/2015 2:30 AM Performed by: Marisa Severin Authorized by: Marisa Severin Consent: Verbal consent obtained. Indications: urinary retention Local anesthesia used: no Patient sedated: no Preparation: Patient was prepped and draped in the usual sterile fashion. Catheter insertion: indwelling Catheter type: coude Catheter size: 20 Fr Complicated insertion: yes Bladder irrigation: no Number of attempts: 2 Comments: Unable to  pass.  After 2 attempts.   (including critical care time) Labs Review Labs Reviewed - No data to display  Imaging Review No results found.   EKG Interpretation None      MDM   Final diagnoses:  Urinary retention    81 year old male with urinary retention.  Bedside ultrasound and bladder scan showed distended bladder.  Unable to pass a 22 coude into the bladder.  Despite 2 attempts.  I'm hesitant to continue to try as he has a history of false passages and need for cystoscope in the past.  We'll contact urology for their assistance.  4:23 AM Dr Annabell HowellsWrenn has been able to place 18 coude with cystoscope.  Pt to f/u on Monday.      Marisa Severinlga Royalty Domagala, MD 02/28/15 [phone]

## 2015-02-28 NOTE — Consult Note (Signed)
Subjective: I was asked to see Trevor Burch by Dr. Norlene Campbell for urinary retention and difficult foley placement.   He is an [name] patient of Dr. Marlou Porch with a history of chronic retention.   He has been on CIC but has been unable to cath for the last 24 hrs and presented to the ER with moderate suprapubic pain.   Attempts at foley placement by Dr. Norlene Campbell and the staff were unsuccessfull.   He was hospitalized in December for urosepsis and retention and had a foley for a month because of difficult catheterization and a false passage at that time.   He has no associated signs or symptoms.  ROS:  Review of Systems  Constitutional: Negative for fever.  Gastrointestinal: Positive for abdominal pain.  All other systems reviewed and are negative.  No Known Allergies  Past Medical History  Diagnosis Date  . Kidney function abnormal     has approx. 20% function - stablized x several years  . GERD (gastroesophageal reflux disease)   . Hypertension   . Atrial fibrillation   . Community acquired pneumonia   . Hypercholesteremia   . Allergic rhinitis   . Chronic kidney disease     RENAL INSUFFICENCY   . Arthritis     BILATERAL KNEES  . CHF (congestive heart failure)   . Hepatitis     HX OF HEP B    Past Surgical History  Procedure Laterality Date  . Appendectomy    . Tonsillectomy    . Prostate surgery      for enlarged prostate and blockages - no cancer  . Cardioversion N/A 11/01/2013    Procedure: CARDIOVERSION;  Surgeon: Lesleigh Noe, MD;  Location: [facility] OR;  Service: Cardiovascular;  Laterality: N/A;    History   Social History  . Marital Status: Married    Spouse Name: N/A  . Number of Children: N/A  . Years of Education: N/A   Occupational History  . Retired Electronics engineer     Social History Main Topics  . Smoking status: Never Smoker   . Smokeless tobacco: Never Used  . Alcohol Use: Yes     Comment: ocassionally  . Drug Use: No  . Sexual Activity: Not on file   Other  Topics Concern  . Not on file   Social History Narrative    Family History  Problem Relation Age of Onset  . Heart disease Father     Anti-infectives: Anti-infectives    None      No current facility-administered medications for this encounter.   Current Outpatient Prescriptions  Medication Sig Dispense Refill  . acetaminophen (TYLENOL) 325 MG tablet Take 650 mg by mouth every 6 (six) hours as needed for moderate pain.    Marland Kitchen amiodarone (PACERONE) 200 MG tablet Take 200 mg by mouth daily.    Marland Kitchen amLODipine (NORVASC) 5 MG tablet Take 5 mg by mouth 2 (two) times daily.    Marland Kitchen aspirin EC 81 MG tablet Take 1 tablet (81 mg total) by mouth daily.    . brimonidine (ALPHAGAN) 0.2 % ophthalmic solution Place 1 drop into the left eye 2 (two) times daily.    . CHOLINE PO Take 60 mg by mouth daily.     . Dorzolamide HCl-Timolol Mal PF 22.3-6.8 MG/ML SOLN Place 1 drop into the left eye 2 (two) times daily.    . furosemide (LASIX) 80 MG tablet Take 80 mg by mouth 2 (two) times daily.     Marland Kitchen  hydrocortisone (ANUSOL-HC) 25 MG suppository Place 1 suppository (25 mg total) rectally 2 (two) times daily. 12 suppository 0  . Multiple Vitamin (MULTIVITAMIN WITH MINERALS) TABS tablet Take 1 tablet by mouth daily.    . traMADol (ULTRAM) 50 MG tablet Take 1 tablet (50 mg total) by mouth every 6 (six) hours as needed for moderate pain. 30 tablet 0  . witch hazel-glycerin (TUCKS) pad Apply topically now. (Patient taking differently: Apply 1 application topically as needed for itching, irritation or hemorrhoids. ) 40 each 12  . amiodarone (PACERONE) 200 MG tablet TAKE 1 TABLET DAILY (Patient not taking: Reported on 02/28/2015) 90 tablet 0  . amLODipine (NORVASC) 5 MG tablet TAKE 1 TABLET TWICE A DAY (Patient not taking: Reported on 02/28/2015) 180 tablet 0  . ciprofloxacin (CIPRO) 250 MG tablet Take 1 tablet (250 mg total) by mouth daily. x4 days (Patient not taking: Reported on 02/28/2015) 4 tablet 0  . dexlansoprazole  (DEXILANT) 60 MG capsule Take 60 mg by mouth daily.    . diclofenac sodium (VOLTAREN) 1 % GEL Apply 2 g topically 3 (three) times daily as needed (inflammation/pain).    . LUTEIN PO Take 2 mg by mouth daily.    Marland Kitchen. OVER THE COUNTER MEDICATION Take 1 tablet by mouth 2 (two) times daily. Joint Care    . PROAIR HFA 108 (90 BASE) MCG/ACT inhaler Inhale 2 puffs into the lungs every 4 (four) hours as needed for wheezing.   0   Past, Social and Family history reviewed.   Objective: Vital signs in last 24 hours: Pulse Rate:  [65] 65 (04/09 0158) Resp:  [18] 18 (04/09 0158) BP: (142)/(68) 142/68 mmHg (04/09 0158) SpO2:  [88 %] 88 % (04/09 0158)  Intake/Output from previous day:   Intake/Output this shift:     Physical Exam  Constitutional: He is oriented to person, place, and time and well-developed, well-nourished, and in no distress.  Cardiovascular: Normal rate and regular rhythm.   Pulmonary/Chest: Effort normal. No respiratory distress.  Abdominal: Soft. He exhibits mass (suprapubic). There is tenderness.  Genitourinary:  He has a circumcised phallus with an adequate meatus.  There is a bloody bandage on the dorsal penis for a zipper injury.  Scrotum and contents and unremarkable.     Musculoskeletal: Normal range of motion. He exhibits no edema.  Neurological: He is alert and oriented to person, place, and time.  Skin: Skin is warm and dry.  Vitals reviewed.   Lab Results:  No results for input(s): WBC, HGB, HCT, PLT in the last 72 hours. BMET No results for input(s): NA, K, CL, CO2, GLUCOSE, BUN, CREATININE, CALCIUM in the last 72 hours. PT/INR No results for input(s): LABPROT, INR in the last 72 hours. ABG No results for input(s): PHART, HCO3 in the last 72 hours.  Invalid input(s): PCO2, PO2  Studies/Results: No results found.  I discussed his case with Dr. Norlene Campbelltter.   I have reviewed his recent labs and his last urine culture which grew e. Coli.   It was sensitive to  ceftriaxone.    Procedure: Cystoscopy with foley placement.   See dictated note. [id number]  Assessment: Mild urethral stricture with bulbar false passage and retention. Probable UTI/colonization.  Plan: Foley left indwelling and should probably be kept for 2-3 months to allow further healing of the false passage.  Urine culture and UA ordered. Ceftriaxone 1gm IM given.   He has a f/u appt on Monday with Dr. Marlou Burch that he was encouraged to  keep.   CC: Dr. Marisa Burch and Dr. Cordella Burch.       Trevor Burch 02/28/2015

## 2015-03-03 LAB — URINE CULTURE: Colony Count: >=100000

## 2015-03-04 ENCOUNTER — Telehealth (HOSPITAL_BASED_OUTPATIENT_CLINIC_OR_DEPARTMENT_OTHER): Payer: Self-pay | Admitting: Emergency Medicine

## 2015-03-04 NOTE — Progress Notes (Signed)
ED Antimicrobial Stewardship Positive Culture Follow Up   Trevor Burch is an 81 y.o. male who presented to [facility] on 02/28/2015 with a chief complaint of  Chief Complaint  Patient presents with  . Urinary Retention    Recent Results (from the past 720 hour(s))  Culture, Urine     Status: None   Collection Time: 02/07/15  3:25 PM  Result Value Ref Range Status   Specimen Description URINE, CATHETERIZED  Final   Special Requests Normal  Final   Colony Count   Final    >=100,000 COLONIES/ML Performed at [facility]    Culture   Final    Multiple bacterial morphotypes present, none predominant. Suggest appropriate recollection if clinically indicated. Performed at [facility]    Report Status 02/10/2015 FINAL  Final  Urine culture     Status: None   Collection Time: 02/10/15  1:01 PM  Result Value Ref Range Status   Specimen Description URINE, RANDOM  Final   Special Requests NONE  Final   Colony Count   Final    7,000 COLONIES/ML Performed at [facility]    Culture   Final    INSIGNIFICANT GROWTH Performed at [facility]    Report Status 02/12/2015 FINAL  Final  Culture, Urine     Status: None   Collection Time: 02/28/15  4:19 AM  Result Value Ref Range Status   Specimen Description URINE, CATHETERIZED  Final   Special Requests NONE  Final   Colony Count   Final    >=100,000 COLONIES/ML Performed at [facility]    Culture   Final    ESCHERICHIA COLI Note: Two isolates with different morphologies were identified as the same organism.The most resistant organism was reported. Performed at [facility]    Report Status 03/03/2015 FINAL  Final   Organism ID, Bacteria ESCHERICHIA COLI  Final      Susceptibility   Escherichia coli - MIC*    AMPICILLIN >=32 RESISTANT Resistant     CEFAZOLIN >=64 RESISTANT Resistant     CEFTRIAXONE <=1 SENSITIVE Sensitive     CIPROFLOXACIN <=0.25 SENSITIVE Sensitive    GENTAMICIN <=1 SENSITIVE Sensitive     LEVOFLOXACIN <=0.12 SENSITIVE Sensitive     NITROFURANTOIN <=16 SENSITIVE Sensitive     TOBRAMYCIN <=1 SENSITIVE Sensitive     TRIMETH/SULFA <=20 SENSITIVE Sensitive     PIP/TAZO >=128 RESISTANT Resistant     * ESCHERICHIA COLI   [x]  Patient discharged originally without antimicrobial agent and treatment is now indicated  [age] yoM with urinary retention. Patient has history of bladder outlet obstruction, urethral stricture, and self-caths. UA positive for many bacteria, nitrite negative, and cloudy. Per urology, patient is likely colonized. No treatment was ordered. Urine culture grew E. Coli sensitive to ciprofloxacin, macrobid, and bactrim.  Patient has poor baseline renal function (SCr 4.33).  Plan: Will have flow manager call patient and ask if patient is having symptoms.  If patient is not having symptoms, no treatment is needed.  If patient is having urinary symptoms, treat with Ciprofloxacin 500 mg daily for 7 days.    ED Provider: Ivar Drapeob Browning, PA-C   Giah Fickett, Suzan SlickAshley N 03/04/2015, 12:15 PM Infectious Diseases Pharmacist Phone# [phone]

## 2015-03-22 DEATH — deceased

## 2015-04-10 ENCOUNTER — Telehealth: Payer: Self-pay | Admitting: Interventional Cardiology

## 2015-04-10 NOTE — Telephone Encounter (Signed)
[facility] to [facility] out forward [facility] for completion. 04/10/15 ltd

## 2015-04-15 ENCOUNTER — Ambulatory Visit: Admit: 2015-04-15 | Discharge: 2015-04-15 | Payer: MEDICARE | Attending: Vascular Surgery | Primary: Family Medicine

## 2015-04-15 DIAGNOSIS — I714 Abdominal aortic aneurysm, without rupture, unspecified: Secondary | ICD-10-CM

## 2015-04-15 NOTE — Progress Notes (Signed)
Leon Nguyen   07-04-1931   81 y.o.  male    Chief Complaint   Patient presents with   ??? New Patient     AAA         HPI:  Pt is a new Referral from Dr. Ova Freshwater for AAA. CT Scan reveal 3.9cm AAA. While doing Cardiac Clearance per Echo on 03/02/15 a AAA was noted. Pt had Left Should Replacement 3 weeks ago and recent has AA followed-p by CT Scan done at [place] [facility]. 3.9cm AAA. Pt denies any abdominal or low back pain.    Past Medical History   Diagnosis Date   ??? Hyperlipidemia    ??? Hypertension       Past Surgical History   Procedure Laterality Date   ??? Shoulder arthroplasty Left      No Known Allergies  Current Outpatient Prescriptions   Medication Sig Dispense Refill   ??? aspirin 81 MG tablet Take 81 mg by mouth daily     ??? acetaminophen (TYLENOL) 325 MG tablet Take 650 mg by mouth every 6 hours as needed for Pain     ??? traMADol (ULTRAM) 50 MG tablet Take 50 mg by mouth every 6 hours as needed for Pain     ??? tamsulosin (FLOMAX) 0.4 MG capsule Take 0.4 mg by mouth daily       No current facility-administered medications for this visit.      History reviewed. No pertinent family history.    Review of Systems:       Review of Systems   Constitutional: Negative for fever, chills and unexpected weight change.   HENT: Negative for nosebleeds and voice change.    Respiratory: Negative for apnea, chest tightness and shortness of breath.    Cardiovascular: Negative for chest pain and palpitations.   Gastrointestinal: Negative for nausea, vomiting and abdominal pain.   Endocrine: Negative for cold intolerance.   Genitourinary: Negative for dysuria and frequency.   Musculoskeletal: Negative for back pain and neck pain.   Skin: Negative for pallor, rash and wound.   Neurological: Negative for dizziness, tremors, speech difficulty, weakness, light-headedness, numbness and headaches.   Psychiatric/Behavioral: Negative for hallucinations, behavioral problems, confusion, sleep disturbance, self-injury and agitation. The patient is not  hyperactive.    All other systems reviewed and are negative.      Physical Exam:  BP 153/96 mmHg   Pulse 95   Ht 5\' 11"  (1.803 m)   Wt 192 lb 8 oz (87.317 kg)   BMI 26.86 kg/m2  Physical Exam     Extremities:  No edema, clubbing or cyanosis    Vascular exam:  Abdominal Aorta: Normal; Femoral Arteries: 2+ and equal; Pedal Pulses: 2+ and equal     Diagnostic Testing:       Moderate infrainguinal AAA.    Assessment:        Problem List Items Addressed This Visit     AAA (abdominal aortic aneurysm) ([place]) - Primary    Relevant Medications       aspirin 81 MG tablet    Other Relevant Orders       VL Abdominal AORTA Duplex Scan          Plan: AAA.  Surveillance duplex in 6 months.    Return in about 6 months (around 10/16/2015) for Activity tolerated within reason, Call if pain or swelling worsens, Smoking cessation plan, Risk mod.

## 2015-04-23 ENCOUNTER — Telehealth: Payer: Self-pay | Admitting: Interventional Cardiology

## 2015-04-23 NOTE — Telephone Encounter (Signed)
[facility]. Without knowing the situation, I am unable to make any statement about whether his heart later roll in his death. Did he die at home? Was in the hospital somewhere? Was an autopsy performed?

## 2015-04-23 NOTE — Telephone Encounter (Signed)
Follow up      Want Dr Katrinka BlazingSmith to put on the note that "it was a contributing cause, not the actual cause".  You do not have to call her back

## 2015-04-23 NOTE — Telephone Encounter (Signed)
Returned pt wife call.  Pt passed away in April 2016. She was adv by the that she would qualify for agent [employer] benefits, if should could provide a letter stating that ischemic heart disease contributed to her husbands death.  Letter would be addressed to: To whom it may concern [facility] dept.  Adv her I would fwd her rqst to Dr.Smith and call back with his response. She verbalized understanding.

## 2015-04-23 NOTE — Telephone Encounter (Signed)
New message      Talk to the nurse.  [place] ask questions

## 2015-04-30 NOTE — Telephone Encounter (Signed)
Called to give pt wife Dr.Smith's response. [email or web address]

## 2015-04-30 NOTE — Telephone Encounter (Signed)
Pt wife returned call. Adv her of Dr.Smith's response. She sts that pt was not hospitalized,he was at [facility] for a few weeks. He was last seen by Dr.Sanford, who at that time recommended palliative care.  Pt passed away at [facility]  1-2 days later. There was no autopsy performed. Pt wife will drop off pt records today for Dr.Smith's review.adv her that he will return to the office on 6/10.

## 2015-05-21 IMAGING — CR DG CHEST 2V
2 series · 2 of 2 positions shown · non-contrast
Comparison: 10/30/2014, 05/19/2014, 10/21/2013.

CLINICAL DATA: Pleural effusion.

EXAM:
CHEST  2 VIEW

[view not recorded (1 of 2)]
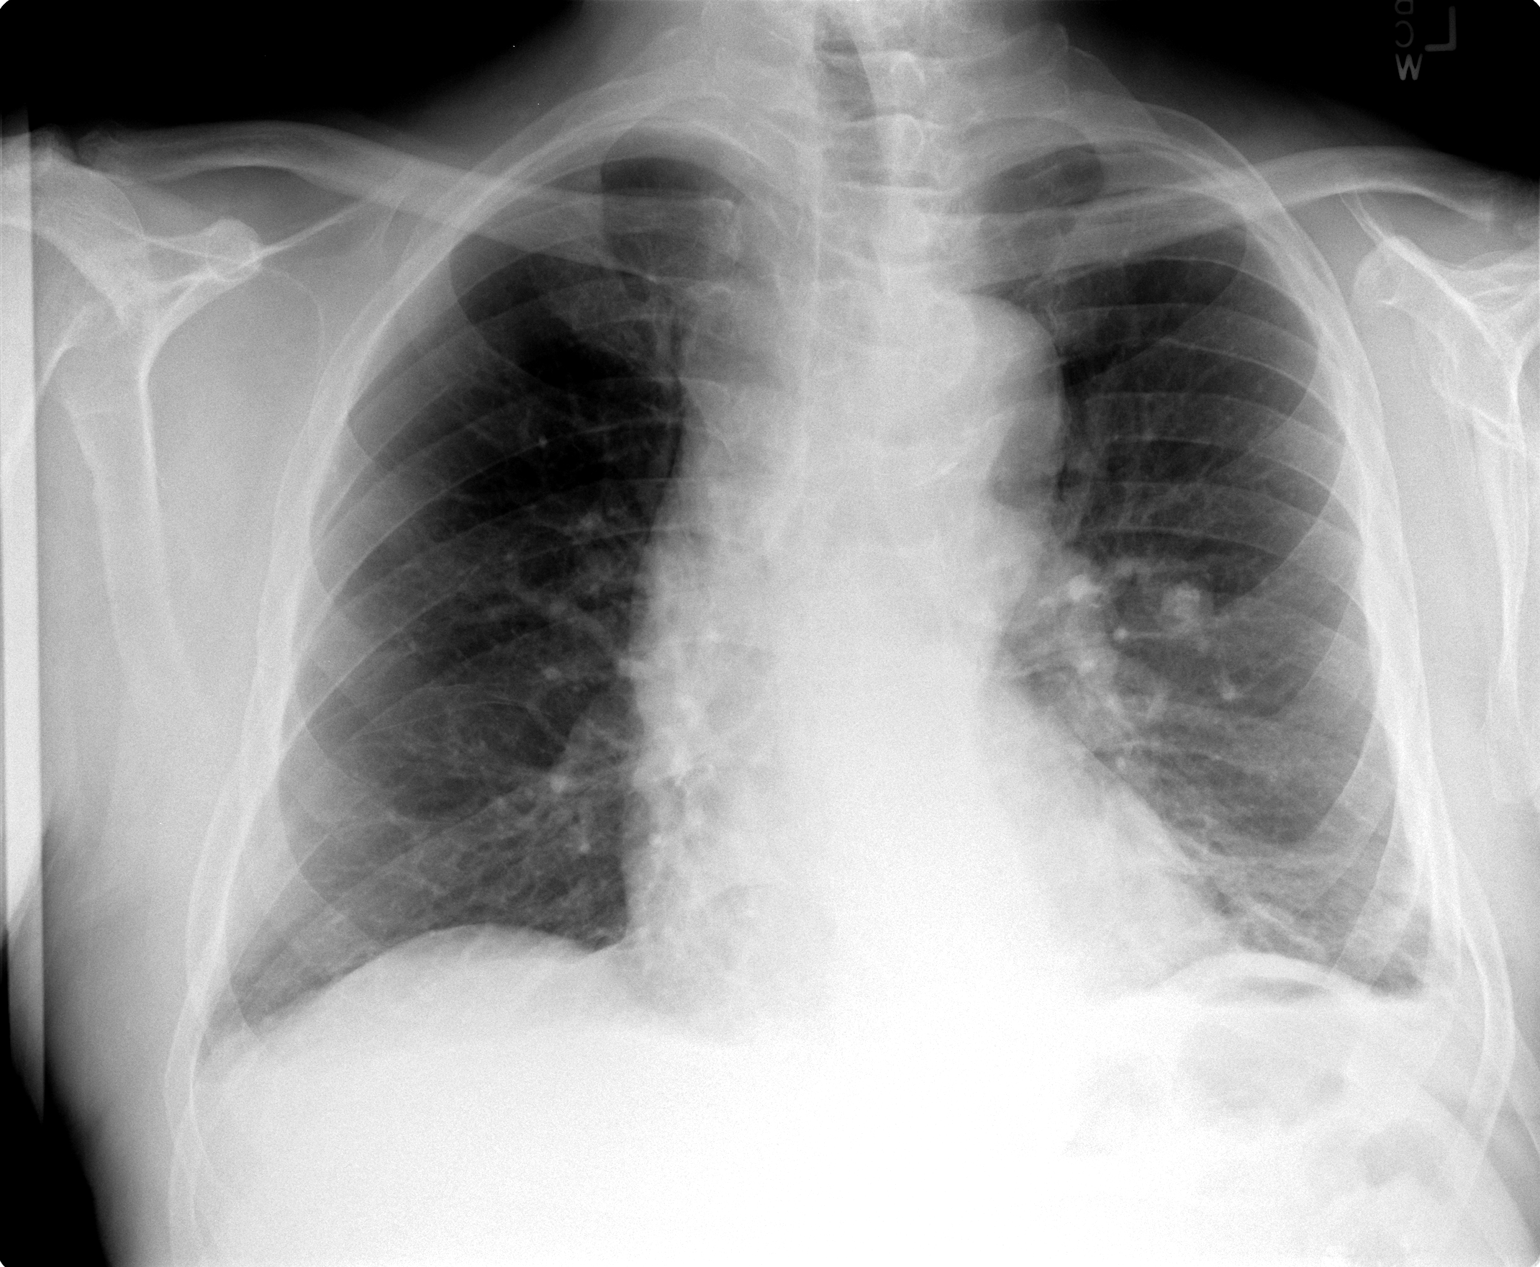

[view not recorded (2 of 2)]
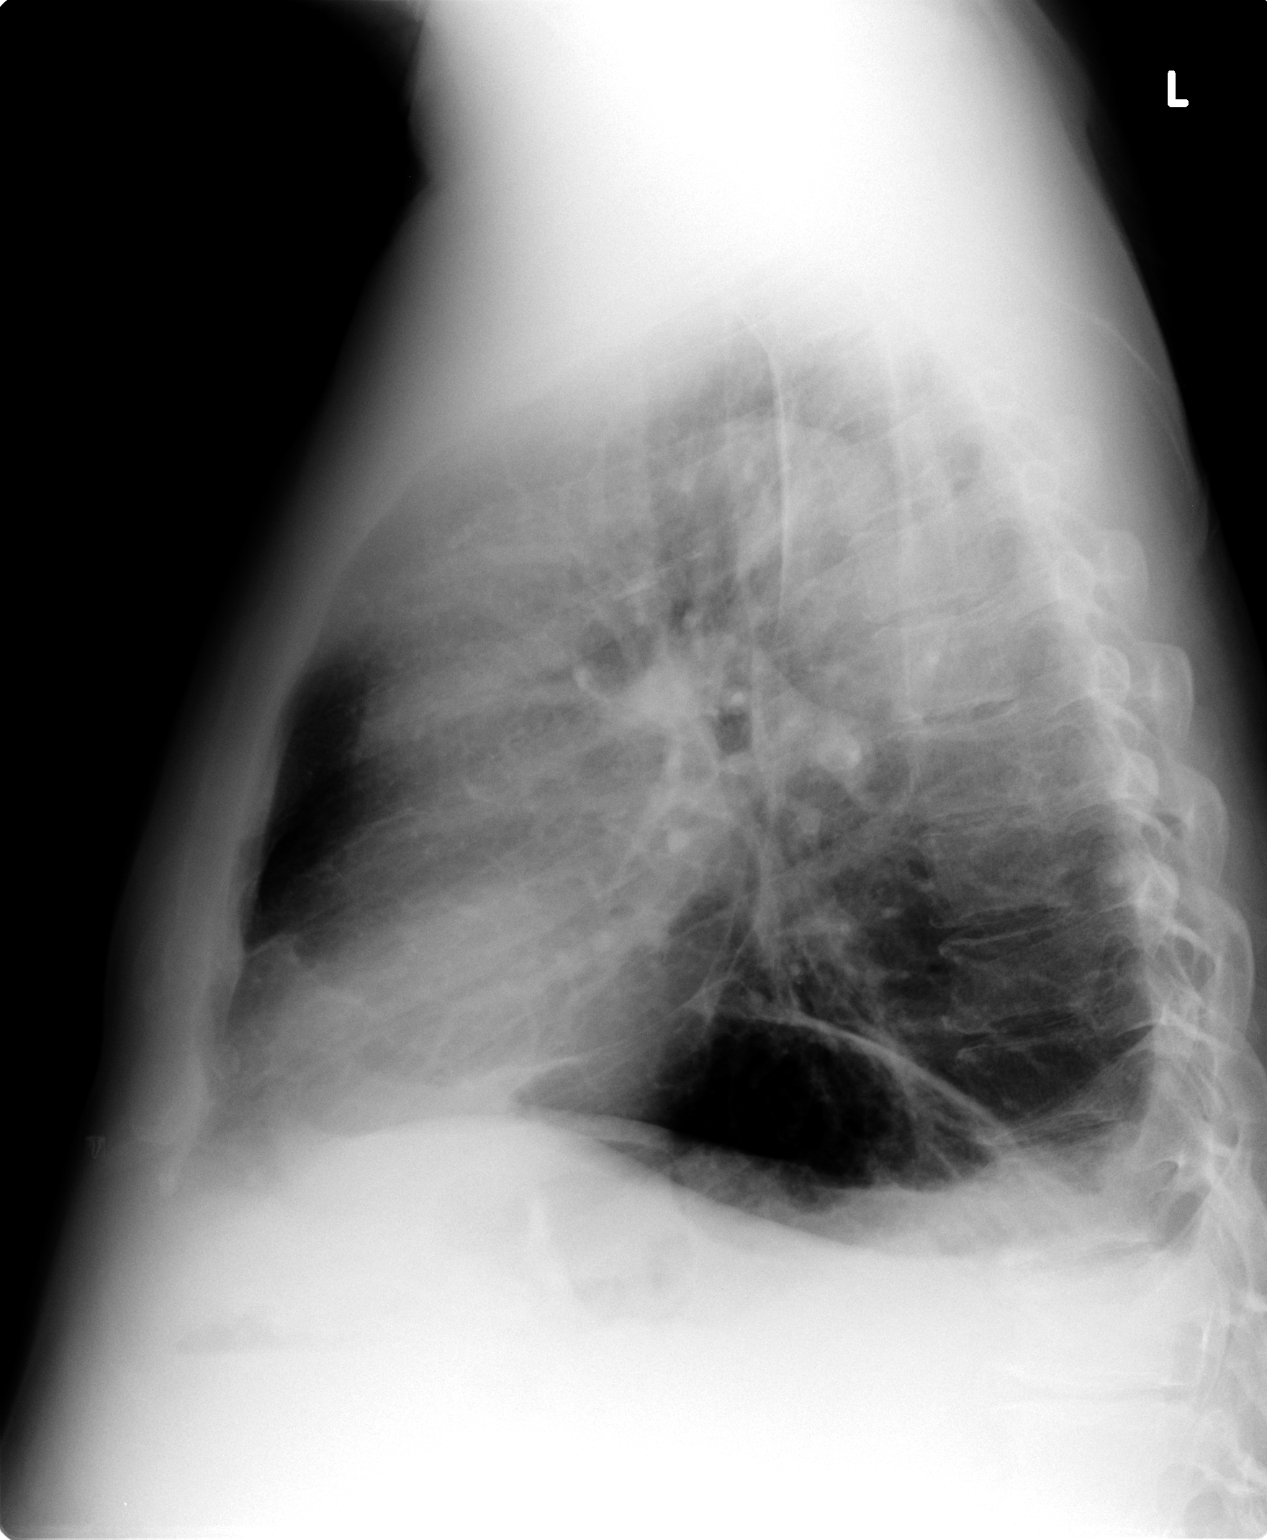

[2 of 2 positions shown; findings below may reference images not displayed]

FINDINGS: Mediastinum hilar structures are stable. Mild fullness of the upper
mediastinum most likely from great vessels. Stable cardiomegaly.
Mild increase in left base infiltrate. No pleural effusion or
pneumothorax. No acute bony abnormality. Calcified pulmonary nodule
left mid lung.
IMPRESSION: 1. Slight progression of left base infiltrate.
2. Calcified pulmonary nodule left mid lung.
3. Stable cardiomegaly.

## 2015-06-02 NOTE — Telephone Encounter (Signed)
Pt wife aware. Dr.Smith will not be able to write a letter indicating pt cause of death was cardiac related

## 2015-07-26 IMAGING — CR DG CHEST 2V
2 series · 2 of 2 positions shown · non-contrast
Comparison: 01/16/2015

CLINICAL DATA: Weakness

EXAM:
CHEST  2 VIEW

[x chest ap]
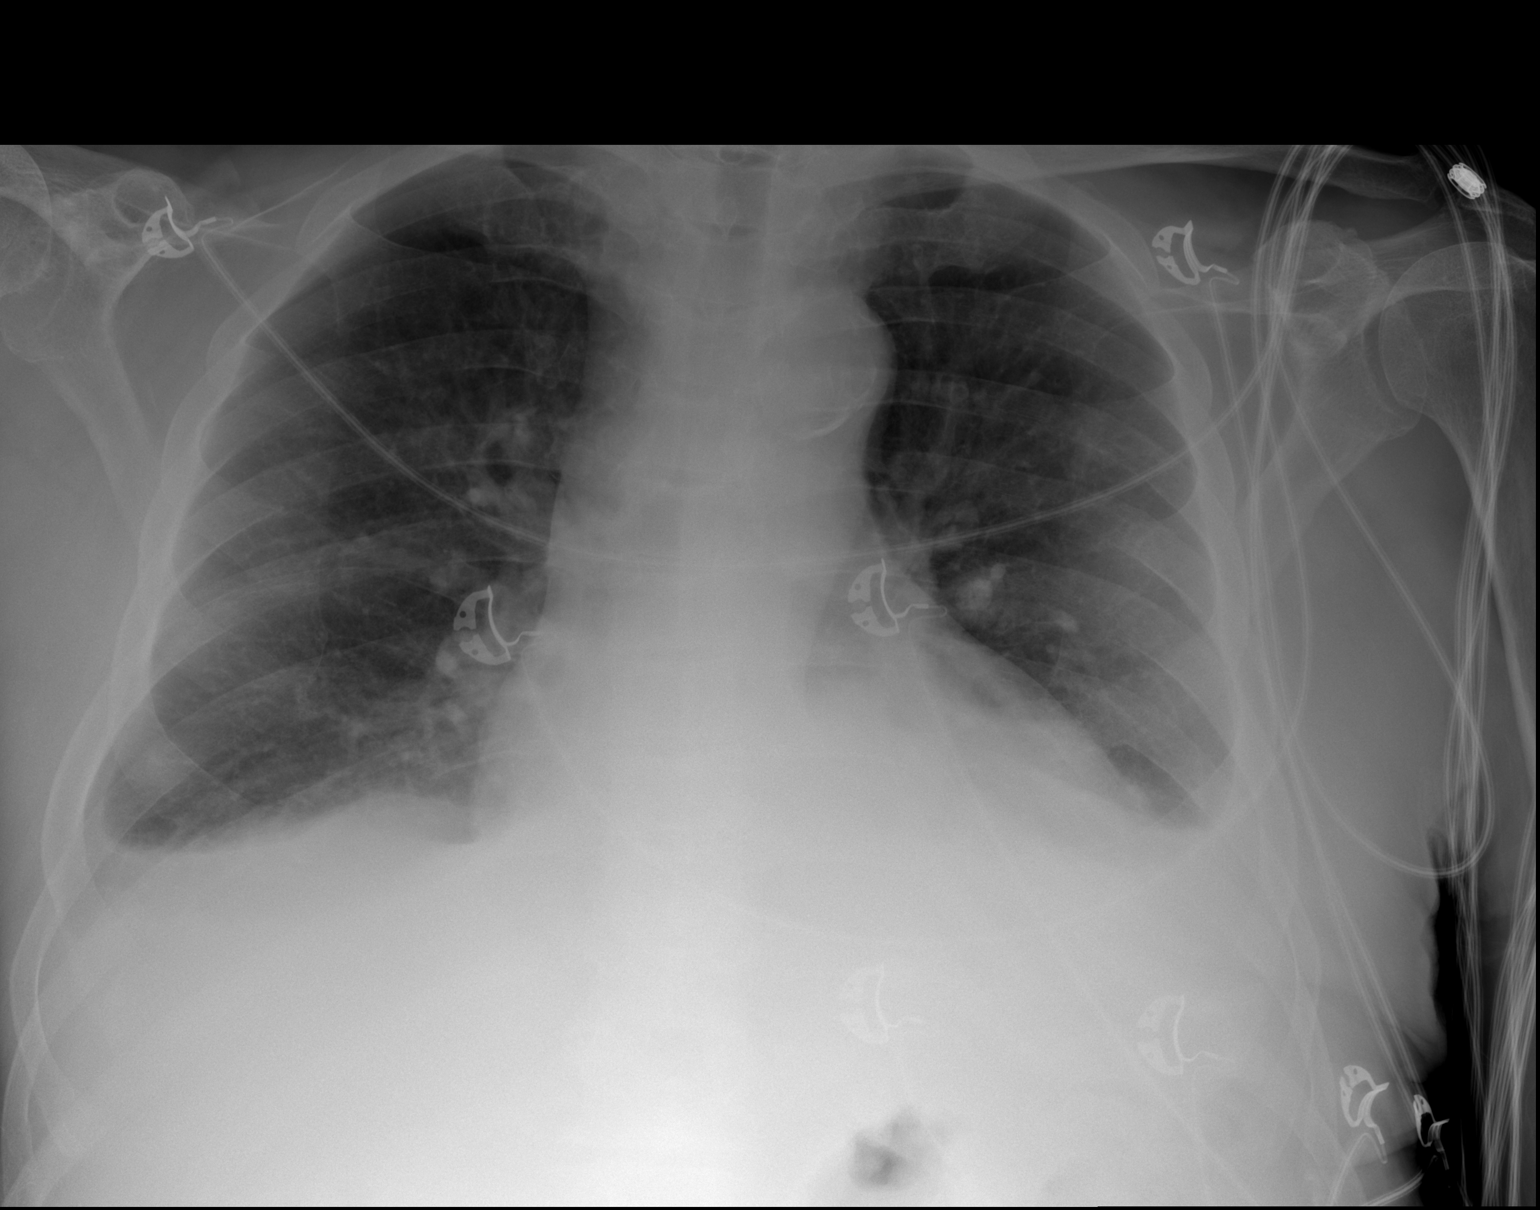

[w chest lat]
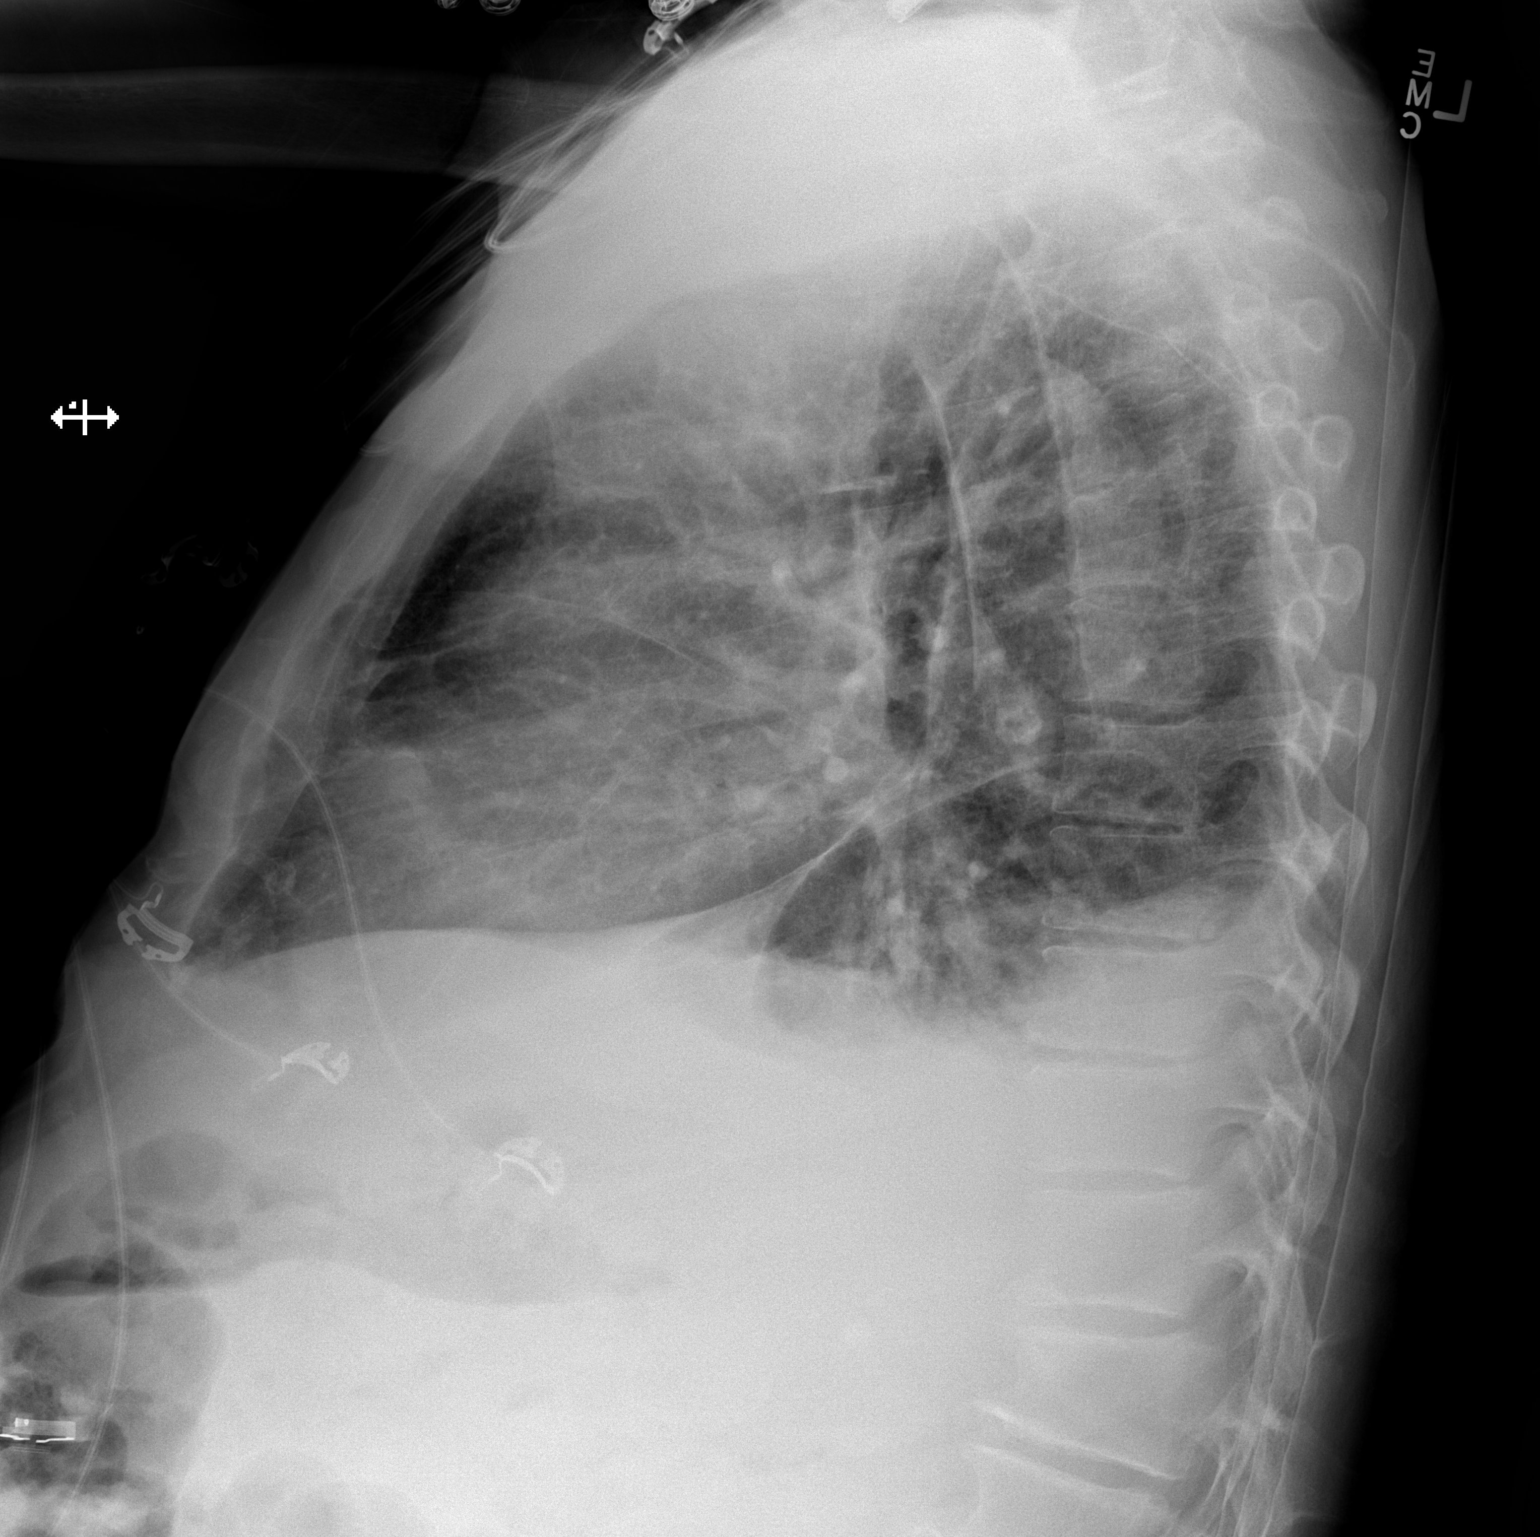

[2 of 2 positions shown; findings below may reference images not displayed]

FINDINGS: Cardiac shadow is at the upper limits of normal in size. Small
bilateral pleural effusions are noted worse than that seen on the
prior study. Left lower lobe infiltrate is noted associated with the
effusion.
IMPRESSION: Left lower lobe pneumonia with bilateral effusions

## 2015-10-09 ENCOUNTER — Encounter: Admit: 2015-10-09 | Discharge: 2015-10-09 | Payer: MEDICARE | Primary: Family Medicine

## 2015-10-09 DIAGNOSIS — 1 ERRONEOUS ENCOUNTER ICD10: Secondary | ICD-10-CM

## 2015-10-21 ENCOUNTER — Ambulatory Visit: Admit: 2015-10-21 | Discharge: 2015-10-21 | Payer: MEDICARE | Attending: Surgery | Primary: Family Medicine

## 2015-10-21 DIAGNOSIS — I714 Abdominal aortic aneurysm, without rupture, unspecified: Secondary | ICD-10-CM

## 2015-10-21 NOTE — Progress Notes (Signed)
Leon Nguyen   October 14, 1931   81 y.o.  male    Chief Complaint   Patient presents with   ??? Results     AA [place]         HPI:  Patient is being seen today to discuss AA [place] results done in the office on 10/09/15.  Patient states he occasionally still gets flank pain, but he thinks this is due to kidney stones.      Past Medical History   Diagnosis Date   ??? Hyperlipidemia    ??? Hypertension       Past Surgical History   Procedure Laterality Date   ??? Shoulder arthroplasty Left      No Known Allergies  Current Outpatient Prescriptions   Medication Sig Dispense Refill   ??? aspirin 81 MG tablet Take 81 mg by mouth daily     ??? traMADol (ULTRAM) 50 MG tablet Take 50 mg by mouth every 6 hours as needed for Pain     ??? acetaminophen (TYLENOL) 325 MG tablet Take 650 mg by mouth every 6 hours as needed for Pain       No current facility-administered medications for this visit.       History reviewed. No pertinent family history.    Review of Systems:       Review of Systems   Constitutional: Negative for chills, fever and unexpected weight change.   HENT: Negative for nosebleeds and voice change.    Respiratory: Negative for apnea, chest tightness and shortness of breath.    Cardiovascular: Negative for chest pain and palpitations.   Gastrointestinal: Negative for abdominal pain, nausea and vomiting.   Endocrine: Negative for cold intolerance.   Genitourinary: Negative for dysuria and frequency.   Musculoskeletal: Negative for back pain and neck pain.   Skin: Negative for pallor, rash and wound.   Neurological: Negative for dizziness, tremors, speech difficulty, weakness, light-headedness, numbness and headaches.   Psychiatric/Behavioral: Negative for agitation, behavioral problems, confusion, hallucinations, self-injury and sleep disturbance. The patient is not hyperactive.    All other systems reviewed and are negative.      Physical Exam:  Visit Vitals   ??? BP (!) 168/84   ??? Pulse 90   ??? Ht 5\' 11"  (1.803 m)   ??? Wt 195 lb (88.5 kg)   ???  BMI 27.2 kg/m2     Physical Exam   Constitutional: He appears well-developed and well-nourished.   HENT:   Head: Normocephalic and atraumatic.   Eyes: EOM are normal.   Neck: No tracheal deviation present.   Cardiovascular: Normal rate and regular rhythm.    Pulmonary/Chest: Effort normal and breath sounds normal.   Abdominal: Soft. He exhibits no distension.   Musculoskeletal: He exhibits no edema.   Neurological:   Grossly intact   Skin: Skin is warm and dry.   Psychiatric: He has a normal mood and affect.        Extremities:  No edema, clubbing or cyanosis    Vascular exam:  Abdominal Aorta: Normal; Pedal Pulses: 2+ and equal     Diagnostic Testing:       US shows infrarenal aneurysm 4.0x4.2    Assessment:        Problem List Items Addressed This Visit     AAA (abdominal aortic aneurysm) ([place]) - Primary          Plan:    F/U with U/S in 6 mos.  If no significant change at that point, recommend  annual surveillance at that point.  Discussed importance of BP management and low sodium diet.      No Follow-up on file.

## 2016-04-25 ENCOUNTER — Encounter: Admit: 2016-04-25 | Discharge: 2016-04-25 | Payer: MEDICARE | Primary: Family Medicine

## 2016-04-28 ENCOUNTER — Ambulatory Visit: Admit: 2016-04-28 | Discharge: 2016-04-28 | Payer: MEDICARE | Attending: Vascular Surgery | Primary: Family Medicine

## 2016-04-28 DIAGNOSIS — I714 Abdominal aortic aneurysm, without rupture, unspecified: Secondary | ICD-10-CM

## 2016-04-28 NOTE — Progress Notes (Signed)
Leon Nguyen   1931-04-28   82 y.o.  male    Chief Complaint   Patient presents with   ??? Results     AA [place]         HPI:  Patient is being seen today to discuss AA [place] results done in the office on 04/25/16.  Patient denies any current health issues.      Past Medical History:   Diagnosis Date   ??? Hyperlipidemia    ??? Hypertension       Past Surgical History:   Procedure Laterality Date   ??? SHOULDER ARTHROPLASTY Left      No Known Allergies  Current Outpatient Prescriptions   Medication Sig Dispense Refill   ??? metoprolol tartrate (LOPRESSOR) 25 MG tablet Take 25 mg by mouth 2 times daily     ??? traZODone (DESYREL) 100 MG tablet Take 100 mg by mouth nightly     ??? aspirin 81 MG tablet Take 81 mg by mouth daily     ??? traMADol (ULTRAM) 50 MG tablet Take 50 mg by mouth every 6 hours as needed for Pain     ??? acetaminophen (TYLENOL) 325 MG tablet Take 650 mg by mouth every 6 hours as needed for Pain       No current facility-administered medications for this visit.       History reviewed. No pertinent family history.    Review of Systems:       Review of Systems   Constitutional: Negative for chills, fever and unexpected weight change.   HENT: Negative for nosebleeds and voice change.    Respiratory: Negative for apnea, chest tightness and shortness of breath.    Cardiovascular: Negative for chest pain and palpitations.   Gastrointestinal: Negative for abdominal pain, nausea and vomiting.   Endocrine: Negative for cold intolerance.   Genitourinary: Negative for dysuria and frequency.   Musculoskeletal: Negative for back pain and neck pain.   Skin: Negative for pallor, rash and wound.   Neurological: Negative for dizziness, tremors, speech difficulty, weakness, light-headedness, numbness and headaches.   Psychiatric/Behavioral: Negative for agitation, behavioral problems, confusion, hallucinations, self-injury and sleep disturbance. The patient is not hyperactive.    All other systems reviewed and are negative.      Physical  Exam:  BP (!) 153/79   Pulse 82   Ht 5\' 11"  ([id number] m)   Wt 194 lb (88 kg)   BMI 27.06 kg/m2  Physical Exam   Constitutional: He appears well-developed and well-nourished.   HENT:   Head: Normocephalic and atraumatic.   Eyes: EOM are normal.   Neck: No tracheal deviation present.   Cardiovascular: Normal rate and regular rhythm.    Pulmonary/Chest: Effort normal and breath sounds normal.   Abdominal: Soft. He exhibits no distension.   Musculoskeletal: He exhibits no edema.   Neurological:   Grossly intact   Skin: Skin is warm and dry.   Psychiatric: He has a normal mood and affect.        Extremities:  No edema, clubbing or cyanosis    Vascular exam:  Abdominal Aorta: Normal; Femoral Arteries: 2+ and equal; Pedal Pulses: 2+ and equal     Diagnostic Testing:       Serial AAA ultrasound    Assessment:        Problem List Items Addressed This Visit     AAA (abdominal aortic aneurysm) ([place]) - Primary    Relevant Medications    metoprolol tartrate (LOPRESSOR) 25 MG  tablet          Plan:    Slow in increase in AAA     Per discussion recheck with CTA   Return for Activity as tolerated within reason, Follow up with Family Doctor, Follow low cholesterol/low sodium.

## 2016-05-04 ENCOUNTER — Encounter: Attending: Surgery | Primary: Family Medicine

## 2016-07-27 ENCOUNTER — Inpatient Hospital Stay: Admit: 2016-07-27 | Attending: Vascular Surgery | Primary: Family Medicine

## 2016-07-27 ENCOUNTER — Encounter

## 2016-07-27 DIAGNOSIS — I714 Abdominal aortic aneurysm, without rupture, unspecified: Secondary | ICD-10-CM

## 2016-07-27 LAB — POCT CREATININE
GFR African American: >60 mL/min/{1.73_m2} (ref >60)
GFR Non-African American: >60 mL/min/{1.73_m2} (ref >60)
POC Creatinine: 1.1 MG/DL (ref 0.9–1.3)

## 2016-07-27 MED ORDER — IOVERSOL 68 % IV SOLN
68 % | Freq: Once | INTRAVENOUS | Status: AC | PRN
Start: 2016-07-27 — End: 2016-07-27
  Administered 2016-07-27: 14:00:00 100 mL via INTRAVENOUS

## 2016-07-27 MED ORDER — IOVERSOL 68 % IV SOLN
68 | INTRAVENOUS | Status: AC
Start: 2016-07-27 — End: 2016-07-27

## 2016-07-27 MED ORDER — NORMAL SALINE FLUSH 0.9 % IV SOLN
0.9 % | Freq: Once | INTRAVENOUS | Status: AC
Start: 2016-07-27 — End: 2016-07-27
  Administered 2016-07-27: 14:00:00 10 mL via INTRAVENOUS

## 2016-07-27 MED ORDER — NORMAL SALINE FLUSH 0.9 % IV SOLN
0.9 | INTRAVENOUS | Status: AC
Start: 2016-07-27 — End: 2016-07-27

## 2016-07-27 MED FILL — OPTIRAY 320 68 % IV SOLN: 68 % | INTRAVENOUS | Qty: 100

## 2016-07-27 MED FILL — NORMAL SALINE FLUSH 0.9 % IV SOLN: 0.9 % | INTRAVENOUS | Qty: 10

## 2016-08-02 ENCOUNTER — Ambulatory Visit: Admit: 2016-08-02 | Discharge: 2016-08-02 | Payer: MEDICARE | Attending: Vascular Surgery | Primary: Family Medicine

## 2016-08-02 DIAGNOSIS — I714 Abdominal aortic aneurysm, without rupture, unspecified: Secondary | ICD-10-CM

## 2016-08-02 NOTE — Progress Notes (Signed)
Leon Nguyen   1931/10/24   82 y.o.  male    Chief Complaint   Patient presents with   ??? Results     CTA ABD/AORTA         HPI:  Patient is being seen today to discuss CTA ABD/AORTA results done 07/27/16.  Patient denies any complaints at this time.     Past Medical History:   Diagnosis Date   ??? Hyperlipidemia    ??? Hypertension       Past Surgical History:   Procedure Laterality Date   ??? SHOULDER ARTHROPLASTY Left      No Known Allergies  Current Outpatient Prescriptions   Medication Sig Dispense Refill   ??? metoprolol tartrate (LOPRESSOR) 25 MG tablet Take 25 mg by mouth 2 times daily     ??? traZODone (DESYREL) 100 MG tablet Take 100 mg by mouth nightly     ??? aspirin 81 MG tablet Take 81 mg by mouth daily     ??? traMADol (ULTRAM) 50 MG tablet Take 50 mg by mouth every 6 hours as needed for Pain     ??? acetaminophen (TYLENOL) 325 MG tablet Take 650 mg by mouth every 6 hours as needed for Pain       No current facility-administered medications for this visit.       History reviewed. No pertinent family history.    Review of Systems:       Review of Systems    Physical Exam:  BP 128/70   Pulse 76   Ht 5\' 11"  (1.803 m)   Wt 192 lb (87.1 kg)   BMI 26.78 kg/m2  Physical Exam   Constitutional: He appears well-developed and well-nourished.   HENT:   Head: Normocephalic and atraumatic.   Eyes: EOM are normal.   Neck: No tracheal deviation present.   Cardiovascular: Normal rate and regular rhythm.    Pulmonary/Chest: Effort normal and breath sounds normal.   Abdominal: Soft. He exhibits no distension.   Musculoskeletal: He exhibits no edema.   Neurological:   Grossly intact   Skin: Skin is warm and dry.   Psychiatric: He has a normal mood and affect.     Leon Nguyen   [birth year]   82 y.o.  male    Chief Complaint   Patient presents with   ??? Results     CTA ABD/AORTA         HPI:  Patient is being seen today to discuss AA [place] results done in the office on 04/25/16.  Patient denies any current health issues.      Past Medical History:    Diagnosis Date   ??? Hyperlipidemia    ??? Hypertension       Past Surgical History:   Procedure Laterality Date   ??? SHOULDER ARTHROPLASTY Left      No Known Allergies  Current Outpatient Prescriptions   Medication Sig Dispense Refill   ??? metoprolol tartrate (LOPRESSOR) 25 MG tablet Take 25 mg by mouth 2 times daily     ??? traZODone (DESYREL) 100 MG tablet Take 100 mg by mouth nightly     ??? aspirin 81 MG tablet Take 81 mg by mouth daily     ??? traMADol (ULTRAM) 50 MG tablet Take 50 mg by mouth every 6 hours as needed for Pain     ??? acetaminophen (TYLENOL) 325 MG tablet Take 650 mg by mouth every 6 hours as needed for Pain       No  current facility-administered medications for this visit.       History reviewed. No pertinent family history.    Review of Systems:       Review of Systems   Constitutional: Negative for chills, fever and unexpected weight change.   HENT: Negative for nosebleeds and voice change.    Respiratory: Negative for apnea, chest tightness and shortness of breath.    Cardiovascular: Negative for chest pain and palpitations.   Gastrointestinal: Negative for abdominal pain, nausea and vomiting.   Endocrine: Negative for cold intolerance.   Genitourinary: Negative for dysuria and frequency.   Musculoskeletal: Negative for back pain and neck pain.   Skin: Negative for pallor, rash and wound.   Neurological: Negative for dizziness, tremors, speech difficulty, weakness, light-headedness, numbness and headaches.   Psychiatric/Behavioral: Negative for agitation, behavioral problems, confusion, hallucinations, self-injury and sleep disturbance. The patient is not hyperactive.    All other systems reviewed and are negative.      Physical Exam:  BP 128/70   Pulse 76   Ht 5\' 11"  (1.803 m)   Wt 192 lb (87.1 kg)   BMI 26.78 kg/m2  Physical Exam   Constitutional: He appears well-developed and well-nourished.   HENT:   Head: Normocephalic and atraumatic.   Eyes: EOM are normal.   Neck: No tracheal deviation present.    Cardiovascular: Normal rate and regular rhythm.    Pulmonary/Chest: Effort normal and breath sounds normal.   Abdominal: Soft. He exhibits no distension.   Musculoskeletal: He exhibits no edema.   Neurological:   Grossly intact   Skin: Skin is warm and dry.   Psychiatric: He has a normal mood and affect.        Extremities:  No edema, clubbing or cyanosis    Vascular exam:  Abdominal Aorta: Normal; Femoral Arteries: 2+ and equal; Pedal Pulses: 2+ and equal     Diagnostic Testing:       Serial AAA ultrasound    Assessment:        Problem List Items Addressed This Visit     AAA (abdominal aortic aneurysm) ([place]) - Primary          Plan:    Slow in increase in AAA     Per discussion recheck with CTA        Extremities:  No edema, clubbing or cyanosis    Vascular exam:  Abdominal Aorta: Normal; Femoral Arteries: 2+ and equal; Pedal Pulses: 2+ and equal     Diagnostic Testing:       CTA   tortuous calcific aorta  AAA widest about 4 cm  Assessment:        Problem List Items Addressed This Visit     AAA (abdominal aortic aneurysm) ([place]) - Primary          Plan:    Reviewed findings of CT scan and  A copy of the CT report  Pt to follow with Dr Ova Freshwater with regards to rest of the CT scan findings          Return for Activity as tolerated within reason, Follow up with Family Doctor, Follow low cholesterol/low sodium.

## 2017-03-08 ENCOUNTER — Encounter: Admit: 2017-03-08 | Discharge: 2017-03-08 | Payer: MEDICARE | Primary: Family Medicine

## 2017-03-21 ENCOUNTER — Ambulatory Visit: Admit: 2017-03-21 | Discharge: 2017-03-21 | Payer: MEDICARE | Attending: Vascular Surgery | Primary: Family Medicine

## 2017-03-21 DIAGNOSIS — I714 Abdominal aortic aneurysm, without rupture, unspecified: Secondary | ICD-10-CM

## 2017-03-21 NOTE — Progress Notes (Signed)
Leon Nguyen   08/18/1931   83 y.o.  male    Chief Complaint   Patient presents with   . Results     AA [place]         HPI:  Patient is being seen today to discuss AA [place] results done in the office on 03/08/17.     Past Medical History:   Diagnosis Date   . Hyperlipidemia    . Hypertension       Past Surgical History:   Procedure Laterality Date   . SHOULDER ARTHROPLASTY Left      No Known Allergies  Current Outpatient Prescriptions   Medication Sig Dispense Refill   . metoprolol tartrate (LOPRESSOR) 25 MG tablet Take 25 mg by mouth 2 times daily     . traZODone (DESYREL) 100 MG tablet Take 100 mg by mouth nightly     . aspirin 81 MG tablet Take 81 mg by mouth daily     . traMADol (ULTRAM) 50 MG tablet Take 50 mg by mouth every 6 hours as needed for Pain     . acetaminophen (TYLENOL) 325 MG tablet Take 650 mg by mouth every 6 hours as needed for Pain       No current facility-administered medications for this visit.       History reviewed. No pertinent family history.    Review of Systems:       Review of Systems   All other systems reviewed and are negative.      Physical Exam:  BP (!) 148/80   Pulse 75   Ht 6' (1.829 m)   Wt 194 lb (88 kg)   BMI 26.31 kg/m   Physical Exam   Constitutional: He appears well-developed and well-nourished.   HENT:   Head: Normocephalic and atraumatic.   Eyes: EOM are normal.   Neck: No tracheal deviation present.   Cardiovascular: Normal rate and regular rhythm.    Pulmonary/Chest: Effort normal and breath sounds normal.   Abdominal: Soft. He exhibits no distension.   Musculoskeletal: He exhibits no edema.   Neurological:   Grossly intact   Skin: Skin is warm and dry.   Psychiatric: He has a normal mood and affect.        Extremities:  No edema, clubbing or cyanosis    Vascular exam:  Abdominal Aorta: Normal; Femoral Arteries: 2+ and equal; Pedal Pulses: 2+ and equal     Diagnostic Testing:       Serial ultrasounds AAA      Assessment:        Problem List Items Addressed This Visit      AAA (abdominal aortic aneurysm) ([place]) - Primary        Active Ambulatory Problems     Diagnosis Date Noted   . AAA (abdominal aortic aneurysm) ([place]) 04/15/2015     Resolved Ambulatory Problems     Diagnosis Date Noted   . No Resolved Ambulatory Problems     Past Medical History:   Diagnosis Date   . Hyperlipidemia    . Hypertension          Plan:    Progression of AAA now 4.8 from 4.3 cm  In less than year   per discussion, recheck in three months   consider EVAR now, check Ct scan   discussed options with pt and wife    Will review with Dr. Aquilla Solian      Return for Activity as tolerated within reason, Follow low  cholesterol/low sodium diet, Follow up with Family D.

## 2017-04-12 ENCOUNTER — Encounter

## 2017-04-12 ENCOUNTER — Inpatient Hospital Stay: Admit: 2017-04-12 | Attending: Vascular Surgery | Primary: Family Medicine

## 2017-04-12 DIAGNOSIS — I714 Abdominal aortic aneurysm, without rupture, unspecified: Secondary | ICD-10-CM

## 2017-04-12 LAB — POCT CREATININE
GFR African American: >60 mL/min/{1.73_m2} (ref >60)
GFR Non-African American: >60 mL/min/{1.73_m2} (ref >60)
POC Creatinine: 1 MG/DL (ref 0.9–1.3)

## 2017-04-12 MED ORDER — IOPAMIDOL 76 % IV SOLN
76 % | Freq: Once | INTRAVENOUS | Status: AC | PRN
Start: 2017-04-12 — End: 2017-04-12
  Administered 2017-04-12: 17:00:00 75 mL via INTRAVENOUS

## 2017-04-12 MED ORDER — NORMAL SALINE FLUSH 0.9 % IV SOLN
0.9 % | Freq: Two times a day (BID) | INTRAVENOUS | Status: DC
Start: 2017-04-12 — End: 2017-04-13
  Administered 2017-04-12: 17:00:00 10 mL via INTRAVENOUS

## 2017-04-19 ENCOUNTER — Ambulatory Visit: Admit: 2017-04-19 | Discharge: 2017-04-19 | Payer: MEDICARE | Attending: Surgery | Primary: Family Medicine

## 2017-04-19 DIAGNOSIS — I714 Abdominal aortic aneurysm, without rupture, unspecified: Secondary | ICD-10-CM

## 2017-04-19 NOTE — Progress Notes (Signed)
Leon Nguyen   [birth year]   83 y.o.  male    Chief Complaint   Patient presents with   . Results     CTA ABD         HPI:  Patient is being seen in the office today to discuss CTA ABD results done 04/12/17.  Last seen 03/21/17 by Dr Leon Nguyen to discuss AA [place] results done in the office on 4/18. Patient states that he has occasional abdominal/stomach pain.      Past Medical History:   Diagnosis Date   . Hyperlipidemia    . Hypertension       Past Surgical History:   Procedure Laterality Date   . SHOULDER ARTHROPLASTY Left      No Known Allergies  Current Outpatient Prescriptions   Medication Sig Dispense Refill   . metoprolol tartrate (LOPRESSOR) 25 MG tablet Take 25 mg by mouth 2 times daily     . traZODone (DESYREL) 100 MG tablet Take 100 mg by mouth nightly     . aspirin 81 MG tablet Take 81 mg by mouth daily     . traMADol (ULTRAM) 50 MG tablet Take 50 mg by mouth every 6 hours as needed for Pain     . acetaminophen (TYLENOL) 325 MG tablet Take 650 mg by mouth every 6 hours as needed for Pain       No current facility-administered medications for this visit.       History reviewed. No pertinent family history.    Review of Systems:       Review of Systems   All other systems reviewed and are negative.      Physical Exam:  BP (!) 174/82   Pulse 85   Ht 6' (1.829 m)   Wt 190 lb (86.2 kg)   BMI 25.77 kg/m   Physical Exam   Constitutional: He appears well-developed and well-nourished.   HENT:   Head: Normocephalic and atraumatic.   Eyes: EOM are normal.   Neck: No tracheal deviation present.   Cardiovascular: Normal rate and regular rhythm.    Pulmonary/Chest: Effort normal and breath sounds normal.   Abdominal: Soft. He exhibits no distension.   Musculoskeletal: He exhibits no edema.   Neurological:   Grossly intact   Skin: Skin is warm and dry.   Psychiatric: He has a normal mood and affect.        Extremities:  No edema, clubbing or cyanosis      Diagnostic Testing:       CTA--AAA 5.2 cm, B iliacs approx  2, L slightly worse than R    Assessment:        Problem List Items Addressed This Visit     AAA (abdominal aortic aneurysm) ([place]) - Primary          Plan:    Concerned that there may not be enough distal landing zone especially on left.  Favor watching the right.  Discussed if the R CIA continues to grow, he may require coiling and limb extension.  Will review recons with rep.  Will try to aim for standard EVAR, but may need to proceed with plugging of LIIA.  Discussed  at length including risks, benefits, and alternatives of the procedures.  All questions were answered.  Patient verbalizes understanding and consents to procedure.  BP elevated in office today.  He's going to resume checking it at home.      Return for Follow up after ultrasound, Continue current  meds, Follow up with PCP, Follow low cholesterol/low so.

## 2017-05-02 NOTE — Anesthesia Pre-Procedure Evaluation (Signed)
Pre-Admission Testing   Pre-Procedural Screening   NP Note    PMH:  Past Medical History:   Diagnosis Date   . Hyperlipidemia    . Hypertension        PSH:    Past Surgical History:   Procedure Laterality Date   . SHOULDER ARTHROPLASTY Left         Current Medications:   Not in a hospital admission.    Allergies:   No Known Allergies          Social History:  Social History     Social History   . Marital status: Married     Spouse name: N/A   . Number of children: N/A   . Years of education: N/A     Occupational History   . Not on file.     Social History Main Topics   . Smoking status: Never Smoker   . Smokeless tobacco: Never Used   . Alcohol use Not on file   . Drug use: Unknown   . Sexual activity: Not on file     Other Topics Concern   . Not on file     Social History Narrative   . No narrative on file        Recent Vitals:  Vitals:    05/04/17 1038   BP: (!) 153/73   Pulse: 60   Resp: 16   Temp: 97.7 F (36.5 C)   SpO2: 95%     Wt Readings from Last 3 Encounters:   04/19/17 190 lb (86.2 kg)   03/21/17 194 lb (88 kg)   08/02/16 192 lb (87.1 kg)      Ht Readings from Last 3 Encounters:   04/19/17 6' (1.829 m)   03/21/17 6' (1.829 m)   08/02/16 5\' 11"  (1.803 m)     There is no height or weight on file to calculate BMI.    Wt Readings from Last 3 Encounters:   04/19/17 190 lb (86.2 kg)   03/21/17 194 lb (88 kg)   08/02/16 192 lb (87.1 kg)       Present on Admission:  **None**       Anesthesia Alert:  NO      Malignant Hyperthermia:  No    History of Sleep Apnea:   No    REVIEW OF SYSTEMS:      EYES: wears glasses    HEENT:   Neg     RESPIRATORY:   Non smoker  Able to lie flat.    CARDIOVASCULAR: HTN, HLD, AAA. (5.2 x 4.6 cm)   On beta blocker and ASA  Echo 2016  EF 62%  No wall motion abnormalities  Mild to mod AR.     GASTROINTESTINAL:  Denies reflux    GENITOURINARY:  BPH, NL urine stream    INTEGUMENT:  Neg     HEMATOLOGIC/LYMPHATIC: neg    ENDOCRINE: neg    MUSCULOSKELETAL:  Left reverse total shoulder, 2016  , 2016. Full ROM.     NEUROLOGICAL:   Neg     BEHAVIOR/PSYCH:   Alert and oriented x 4. Questions answered. Understanding verbalized    PHYSICAL EXAM:  Airway Exam:  Head/Neck movement: full  Thyromental Distance: > 3 FB   History of difficult intubation:  None  Mallampati Classification:  Class II  Teeth: missing upper and lower molars.        LUNGS:  No increased work of breathing, good air exchange, clear to auscultation bilaterally,  no crackles or wheezing  CARDIOVASCULAR:  Normal apical impulse, regular rate and rhythm, normal S1 and S2, no S3 or S4, and no murmur noted    Testing Results:    EKG:  05/04/2017  Sinus bradycardia   Nonspecific T wave abnormality   Prolonged QT   Abnormal ECG   No previous ECGs     LABS:      CBC  Lab Results   Component Value Date/Time    WBC 6.8 05/04/2017 10:45 AM    HGB 14.3 05/04/2017 10:45 AM    HCT 44.7 05/04/2017 10:45 AM    PLT 188 05/04/2017 10:45 AM     RENAL  Lab Results   Component Value Date/Time    NA 144 05/04/2017 10:45 AM    K 4.8 05/04/2017 10:45 AM    CL 105 05/04/2017 10:45 AM    CO2 28 05/04/2017 10:45 AM    BUN 18 05/04/2017 10:45 AM    CREATININE 1.1 05/04/2017 10:45 AM    GLUCOSE 111 (H) 05/04/2017 10:45 AM     COAGS  Lab Results   Component Value Date/Time    PROTIME 12.7 (H) 05/04/2017 10:45 AM    INR 1.12 05/04/2017 10:45 AM    APTT 29.7 05/04/2017 10:45 AM       Summary:  Chart reviewed, pt. Interviewed and examined.   Planned surgical procedure:  EVAR per Dr. Ritta Slot.    1.   HTN, HLD, AAA. (5.2 x 4.6 cm).  Echo 2016, EF 62%. On beta blocker and ASA. Denies CP or SOB.  Active with yard work.    2.   Non smoker. Able to lie flat.    3.   Denies reflux.    4.   BPH, reports has NL urine stream.    5.   Left reverse total shoulder, 2016. Full ROM.       Labs, imaging, and EKG per surgeon.    Anesthesia Evaluation will follow by a certified practitioner prior to surgery.    Electronically signed by Terrence Dupont, APRN - CNP on 05/02/2017 at @

## 2017-05-03 ENCOUNTER — Ambulatory Visit: Admit: 2017-05-03 | Discharge: 2017-05-03 | Payer: MEDICARE | Attending: Surgery | Primary: Family Medicine

## 2017-05-03 DIAGNOSIS — I714 Abdominal aortic aneurysm, without rupture, unspecified: Secondary | ICD-10-CM

## 2017-05-03 NOTE — Progress Notes (Signed)
Leon Nguyen   Apr 03, 1931   83 y.o.  male    Chief Complaint   Patient presents with   ??? Follow-up     Discuss EVAR         HPI:  Patient is being seen in the office today to further discuss EVAR scheduled for 05/12/17. Patient states that he has occasional abdominal/stomach pain.      Past Medical History:   Diagnosis Date   ??? Hyperlipidemia    ??? Hypertension       Past Surgical History:   Procedure Laterality Date   ??? SHOULDER ARTHROPLASTY Left      No Known Allergies  Current Outpatient Prescriptions   Medication Sig Dispense Refill   ??? metoprolol tartrate (LOPRESSOR) 25 MG tablet Take 25 mg by mouth 2 times daily     ??? traZODone (DESYREL) 100 MG tablet Take 100 mg by mouth nightly     ??? aspirin 81 MG tablet Take 81 mg by mouth daily     ??? traMADol (ULTRAM) 50 MG tablet Take 50 mg by mouth every 6 hours as needed for Pain     ??? acetaminophen (TYLENOL) 325 MG tablet Take 650 mg by mouth every 6 hours as needed for Pain       No current facility-administered medications for this visit.       History reviewed. No pertinent family history.    Review of Systems:       Review of Systems   All other systems reviewed and are negative.      Physical Exam:  BP (!) 158/80    Pulse 70    Ht 6' (1.829 m)    Wt 191 lb (86.6 kg)    BMI 25.90 kg/m??   Physical Exam   Constitutional: He appears well-developed and well-nourished.   HENT:   Head: Normocephalic and atraumatic.   Eyes: EOM are normal.   Neck: No tracheal deviation present.   Cardiovascular: Normal rate and regular rhythm.    Pulmonary/Chest: Effort normal and breath sounds normal.   Abdominal: Soft. He exhibits no distension.   Musculoskeletal: He exhibits no edema.   Neurological:   Grossly intact   Skin: Skin is warm and dry.   Psychiatric: He has a normal mood and affect.        Diagnostic Testing:        CTA--AAA 5.2 cm, B iliacs approx 2, L slightly worse than R    Assessment:        Problem List Items Addressed This Visit     AAA (abdominal aortic aneurysm) ([place])  - Primary          Plan:    Reviewed recons.  Can proceed with EVAR without coiling/covering iliac.  Discussed EVAR at length including risks, benefits, and alternatives of the procedures.  All questions were answered.  Patient verbalizes understanding and consents to procedure.    Return for Continue current meds, Allowed to drive, Exercise, Follow up with PCP.

## 2017-05-04 ENCOUNTER — Encounter

## 2017-05-04 ENCOUNTER — Ambulatory Visit: Primary: Family Medicine

## 2017-05-04 ENCOUNTER — Ambulatory Visit: Admit: 2017-05-04 | Primary: Family Medicine

## 2017-05-04 ENCOUNTER — Inpatient Hospital Stay: Admit: 2017-05-04 | Attending: Surgery | Primary: Family Medicine

## 2017-05-04 DIAGNOSIS — R1011 Right upper quadrant pain: Secondary | ICD-10-CM

## 2017-05-04 LAB — CBC WITH AUTO DIFFERENTIAL
Basophils %: 0.7 % (ref 0–1)
Basophils Absolute: 0.1 10*3/uL
Eosinophils %: 2.6 % (ref 0–3)
Eosinophils Absolute: 0.2 10*3/uL
Hematocrit: 44.7 % (ref 42–52)
Hemoglobin: 14.3 GM/DL (ref 13.5–18.0)
Immature Neutrophil %: 0.4 % (ref 0–0.43)
Lymphocytes %: 28.7 % (ref 24–44)
Lymphocytes Absolute: 2 10*3/uL
MCH: 32.1 PG — ABNORMAL HIGH (ref 27–31)
MCHC: 32 % (ref 32.0–36.0)
MCV: 100.2 FL — ABNORMAL HIGH (ref 78–100)
MPV: 10.2 FL (ref 7.5–11.1)
Monocytes %: 11.6 % — ABNORMAL HIGH (ref 0–4)
Monocytes Absolute: 0.8 10*3/uL
Nucleated RBC %: 0 %
Platelets: 188 10*3/uL (ref 140–440)
RBC: 4.46 10*6/uL — ABNORMAL LOW (ref 4.6–6.2)
RDW: 13 % (ref 11.7–14.9)
Segs Absolute: 3.8 10*3/uL
Segs Relative: 56 % (ref 36–66)
Total Immature Neutrophil: 0.03 10*3/uL
Total Nucleated RBC: 0 10*3/uL
WBC: 6.8 10*3/uL (ref 4.0–10.5)

## 2017-05-04 LAB — BASIC METABOLIC PANEL
Anion Gap: 11 (ref 4–16)
BUN: 18 MG/DL (ref 6–23)
CO2: 28 MMOL/L (ref 21–32)
Calcium: 9.2 MG/DL (ref 8.3–10.6)
Chloride: 105 mMol/L (ref 99–110)
Creatinine: 1.1 MG/DL (ref 0.9–1.3)
GFR African American: >60 mL/min/{1.73_m2} (ref >60)
GFR Non-African American: >60 mL/min/{1.73_m2} (ref >60)
Glucose: 111 MG/DL — ABNORMAL HIGH (ref 70–99)
Potassium: 4.8 MMOL/L (ref 3.5–5.1)
Sodium: 144 MMOL/L (ref 135–145)

## 2017-05-04 LAB — EKG 12-LEAD
Atrial Rate: 56 {beats}/min
P Axis: 24 degrees
P-R Interval: 184 ms
Q-T Interval: 480 ms
QRS Duration: 104 ms
QTc Calculation (Bazett): 463 ms
R Axis: 0 degrees
T Axis: 58 degrees
Ventricular Rate: 56 {beats}/min

## 2017-05-04 LAB — PROTIME/INR & PTT
INR: 1.12 INDEX
Protime: 12.7 SECONDS — ABNORMAL HIGH (ref 9.12–12.5)
aPTT: 29.7 SECONDS (ref 21.2–33.0)

## 2017-05-04 NOTE — Patient Instructions (Signed)
See scanned instruction form, instructed to take Metoprolol 25 mg with just a sip of water only the morning of surgery per anesthesia department

## 2017-05-05 ENCOUNTER — Inpatient Hospital Stay: Attending: Surgery | Primary: Family Medicine

## 2017-05-12 ENCOUNTER — Inpatient Hospital Stay
Admit: 2017-05-12 | Discharge: 2017-05-13 | Disposition: A | Discharge status: Home / Self Care | Source: Ambulatory Visit | Attending: Surgery | Admitting: Surgery

## 2017-05-12 LAB — POC ACT-LR
Activated Clotting Time, Low Range: 154 s
Activated Clotting Time, Low Range: 152 s
Activated Clotting Time, Low Range: 263 s

## 2017-05-12 MED ORDER — LABETALOL HCL 5 MG/ML IV SOLN
5 MG/ML | INTRAVENOUS | Status: DC | PRN
Start: 2017-05-12 — End: 2017-05-12

## 2017-05-12 MED ORDER — CEFAZOLIN SODIUM-DEXTROSE 2-4 GM/100ML-% IV SOLN
2-4 GM/100ML-% | Freq: Three times a day (TID) | INTRAVENOUS | Status: AC
Start: 2017-05-12 — End: 2017-05-13
  Administered 2017-05-12 – 2017-05-13 (×2): 2 g via INTRAVENOUS

## 2017-05-12 MED ORDER — ASPIRIN 81 MG PO TBEC
81 MG | Freq: Every morning | ORAL | Status: DC
Start: 2017-05-12 — End: 2017-05-13
  Administered 2017-05-13: 13:00:00 81 mg via ORAL

## 2017-05-12 MED ORDER — CEFAZOLIN SODIUM-DEXTROSE 2-3 GM-% IV SOLR
2-3 GM-% | Freq: Once | INTRAVENOUS | Status: DC
Start: 2017-05-12 — End: 2017-05-12

## 2017-05-12 MED ORDER — FENTANYL CITRATE (PF) 100 MCG/2ML IJ SOLN
1002 MCG/2ML | INTRAMUSCULAR | Status: DC | PRN
Start: 2017-05-12 — End: 2017-05-12

## 2017-05-12 MED ORDER — TRAMADOL HCL 50 MG PO TABS
50 MG | Freq: Four times a day (QID) | ORAL | Status: DC | PRN
Start: 2017-05-12 — End: 2017-05-13

## 2017-05-12 MED ORDER — ONDANSETRON HCL 4 MG/2ML IJ SOLN
4 MG/2ML | Freq: Four times a day (QID) | INTRAMUSCULAR | Status: DC | PRN
Start: 2017-05-12 — End: 2017-05-13

## 2017-05-12 MED ORDER — NORMAL SALINE FLUSH 0.9 % IV SOLN
0.9 % | Freq: Two times a day (BID) | INTRAVENOUS | Status: DC
Start: 2017-05-12 — End: 2017-05-13
  Administered 2017-05-13 (×2): 10 mL via INTRAVENOUS

## 2017-05-12 MED ORDER — METOPROLOL TARTRATE 25 MG PO TABS
25 MG | Freq: Two times a day (BID) | ORAL | Status: DC
Start: 2017-05-12 — End: 2017-05-13
  Administered 2017-05-13 (×2): 25 mg via ORAL

## 2017-05-12 MED ORDER — HYDROMORPHONE HCL 1 MG/ML IJ SOLN
1 MG/ML | INTRAMUSCULAR | Status: DC | PRN
Start: 2017-05-12 — End: 2017-05-12

## 2017-05-12 MED ORDER — LABETALOL HCL 5 MG/ML IV SOLN
5 MG/ML | INTRAVENOUS | Status: DC | PRN
Start: 2017-05-12 — End: 2017-05-13
  Administered 2017-05-12: 20:00:00 10 mg via INTRAVENOUS

## 2017-05-12 MED ORDER — NORMAL SALINE FLUSH 0.9 % IV SOLN
0.9 % | INTRAVENOUS | Status: DC | PRN
Start: 2017-05-12 — End: 2017-05-13

## 2017-05-12 MED ORDER — PROMETHAZINE HCL 25 MG/ML IJ SOLN
25 MG/ML | Freq: Once | INTRAMUSCULAR | Status: DC | PRN
Start: 2017-05-12 — End: 2017-05-12

## 2017-05-12 MED ORDER — SODIUM CHLORIDE 0.9 % IV BOLUS
0.9 % | Freq: Once | INTRAVENOUS | Status: DC
Start: 2017-05-12 — End: 2017-05-12

## 2017-05-12 MED ORDER — ONDANSETRON HCL 4 MG/2ML IJ SOLN
4 MG/2ML | Freq: Once | INTRAMUSCULAR | Status: DC | PRN
Start: 2017-05-12 — End: 2017-05-12

## 2017-05-12 MED ORDER — ACETAMINOPHEN 325 MG PO TABS
325 MG | ORAL | Status: DC | PRN
Start: 2017-05-12 — End: 2017-05-13
  Administered 2017-05-13: 07:00:00 650 mg via ORAL

## 2017-05-12 MED ORDER — SODIUM CHLORIDE 0.9 % IV SOLN
0.9 | INTRAVENOUS | Status: AC
Start: 2017-05-12 — End: 2017-05-12

## 2017-05-12 MED ORDER — MORPHINE SULFATE 2 MG/ML IJ SOLN
2 MG/ML | INTRAMUSCULAR | Status: DC | PRN
Start: 2017-05-12 — End: 2017-05-12

## 2017-05-12 MED ORDER — TRAZODONE HCL 50 MG PO TABS
50 MG | Freq: Every evening | ORAL | Status: DC | PRN
Start: 2017-05-12 — End: 2017-05-13
  Administered 2017-05-13: 02:00:00 100 mg via ORAL

## 2017-05-12 MED ORDER — HYDRALAZINE HCL 20 MG/ML IJ SOLN
20 MG/ML | INTRAMUSCULAR | Status: DC | PRN
Start: 2017-05-12 — End: 2017-05-13
  Administered 2017-05-12: 21:00:00 10 mg via INTRAVENOUS

## 2017-05-12 MED ORDER — SODIUM CHLORIDE 0.45 % IV SOLN
0.45 % | INTRAVENOUS | Status: DC
Start: 2017-05-12 — End: 2017-05-13
  Administered 2017-05-12 – 2017-05-13 (×2): via INTRAVENOUS

## 2017-05-12 MED ORDER — HEPARIN (PORCINE) IN NACL 2-0.9 UNIT/ML-% IJ SOLN
2-0.9 | INTRAMUSCULAR | Status: AC
Start: 2017-05-12 — End: 2017-05-12

## 2017-05-12 MED ORDER — HEPARIN SODIUM (PORCINE) 10000 UNIT/ML IJ SOLN
10000 UNIT/ML | Freq: Once | INTRAMUSCULAR | Status: DC
Start: 2017-05-12 — End: 2017-05-12

## 2017-05-12 MED ORDER — IODIXANOL 320 MG/ML IV SOLN
320 | INTRAVENOUS | Status: AC
Start: 2017-05-12 — End: 2017-05-12

## 2017-05-12 MED FILL — HEPARIN (PORCINE) IN NACL 2-0.9 UNIT/ML-% IJ SOLN: INTRAMUSCULAR | Qty: 3000

## 2017-05-12 MED FILL — CEFAZOLIN SODIUM-DEXTROSE 2-3 GM-% IV SOLR: 2-3 GM-% | INTRAVENOUS | Qty: 50

## 2017-05-12 MED FILL — VISIPAQUE 320 MG/ML IV SOLN: 320 MG/ML | INTRAVENOUS | Qty: 200

## 2017-05-12 MED FILL — HEPARIN SODIUM (PORCINE) 10000 UNIT/ML IJ SOLN: 10000 UNIT/ML | INTRAMUSCULAR | Qty: 3

## 2017-05-12 MED FILL — CEFAZOLIN SODIUM-DEXTROSE 2-4 GM/100ML-% IV SOLN: 2-4 GM/100ML-% | INTRAVENOUS | Qty: 100

## 2017-05-12 MED FILL — SODIUM CHLORIDE 0.9 % IV SOLN: 0.9 % | INTRAVENOUS | Qty: 1000

## 2017-05-12 MED FILL — HYDRALAZINE HCL 20 MG/ML IJ SOLN: 20 MG/ML | INTRAMUSCULAR | Qty: 1

## 2017-05-12 MED FILL — SODIUM CHLORIDE 0.9 % IV SOLN: 0.9 % | INTRAVENOUS | Qty: 100

## 2017-05-12 MED FILL — LABETALOL HCL 5 MG/ML IV SOLN: 5 MG/ML | INTRAVENOUS | Qty: 20

## 2017-05-12 MED FILL — SODIUM CHLORIDE 0.45 % IV SOLN: 0.45 % | INTRAVENOUS | Qty: 1000

## 2017-05-12 NOTE — Anesthesia Pre-Procedure Evaluation (Signed)
Department of Anesthesiology  Preprocedure Note       Name:  Leon Nguyen   Age:  83 y.o.  DOB:  May 24, 1931                                          MRN:  [id number]         Date:  05/12/2017      Surgeon:    Procedure:    Medications prior to admission:   Prior to Admission medications    Medication Sig Start Date End Date Taking? Authorizing Provider   aspirin 81 MG EC tablet Take 81 mg by mouth every morning Over The Counter    Historical Provider, MD   UNABLE TO FIND Take by mouth 2 times daily Over The Counter Osteo Bi Flex    Historical Provider, MD   metoprolol tartrate (LOPRESSOR) 25 MG tablet Take 25 mg by mouth 2 times daily    Historical Provider, MD   traZODone (DESYREL) 100 MG tablet Take 100 mg by mouth nightly 01/25/16   Historical Provider, MD       Current medications:    Current Outpatient Prescriptions   Medication Sig Dispense Refill   . aspirin 81 MG EC tablet Take 81 mg by mouth every morning Over The Counter     . UNABLE TO FIND Take by mouth 2 times daily Over The Counter Osteo Bi Flex     . metoprolol tartrate (LOPRESSOR) 25 MG tablet Take 25 mg by mouth 2 times daily     . traZODone (DESYREL) 100 MG tablet Take 100 mg by mouth nightly       No current facility-administered medications for this encounter.        Allergies:  No Known Allergies    Problem List:    Patient Active Problem List   Diagnosis Code   . AAA (abdominal aortic aneurysm) (HCC) I71.4       Past Medical History:        Diagnosis Date   . AAA (abdominal aortic aneurysm) ([place])    . Dental crowns present     Upper And Lower   . Enlarged prostate    . HOH (hard of hearing)     Bilateral Hearing Aids   . Hyperlipidemia    . Hypertension    . Nocturia    . Shingles Dx 2017    Left Side Of Face   . Shortness of breath on exertion    . Wears glasses    . Wears hearing aid     Bilateral        Past Surgical History:        Procedure Laterality Date   . APPENDECTOMY  1970   . COLONOSCOPY  Last Done In 2000's    Polyps Removed In Past    . JOINT REPLACEMENT Left 2017    Total Left Shoulder       Social History:    Social History   Substance Use Topics   . Smoking status: Former Smoker     Packs/day: 0.50     Years: 2.00     Types: Cigarettes     Start date: 78     Quit date: 1998   . Smokeless tobacco: Never Used   . Alcohol use Yes      Comment: "Occ. Maybe Once A Week"  Counseling given: Not Answered      Vital Signs (Current): There were no vitals filed for this visit.                                           BP Readings from Last 3 Encounters:   05/04/17 (!) 153/73   05/03/17 (!) 158/80   04/19/17 (!) 174/82       NPO Status:                                                                                 BMI:   Wt Readings from Last 3 Encounters:   05/04/17 189 lb (85.7 kg)   05/03/17 191 lb (86.6 kg)   04/19/17 190 lb (86.2 kg)     There is no height or weight on file to calculate BMI.    Anesthesia Evaluation    Airway:         Dental:          Pulmonary:   (+) shortness of breath:                            ROS comment: Former smoker one pack year, stopped 1998   Cardiovascular:    (+) hypertension:, DOE:, hyperlipidemia         Beta Blocker:  Order written         Neuro/Psych:   Negative Neuro/Psych ROS              GI/Hepatic/Renal: Neg GI/Hepatic/Renal ROS            Endo/Other: Negative Endo/Other ROS                    Abdominal:           Vascular:   + PVD, aortic or cerebral, .                                     Anesthesia Plan      general     ASA 3       Induction: intravenous.  arterial line  MIPS: Prophylactic antiemetics administered.  Anesthetic plan and risks discussed with patient.                      Maeola Harman, MD   05/12/2017

## 2017-05-12 NOTE — Plan of Care (Signed)
Problem: Infection:  Goal: Will remain free from infection  Will remain free from infection  Outcome: Ongoing      Problem: Safety:  Goal: Free from accidental physical injury  Free from accidental physical injury  Outcome: Ongoing    Goal: Free from intentional harm  Free from intentional harm  Outcome: Ongoing      Problem: Daily Care:  Goal: Daily care needs are met  Daily care needs are met  Outcome: Ongoing

## 2017-05-12 NOTE — Other (Signed)
Pressure held by this nurse for 15 minutes as directed by Dr. Rory Percy, pt tolerated well, educated about holding pressure when coughing, sneezing etc demonstrated understanding. Dressing reinforced and there is not any signs of hematoma, bruising or further bleeding. Will monitor close

## 2017-05-12 NOTE — Progress Notes (Signed)
1239: Arrived to PACU from OR. Monitors applied, alarms on. Report obtained from Kirstie Peri CRNA.   1330: Turned and repositioned in bed. Denies pain. Ice chips given. Wife updated.  1355: Transported to room 2120. Care assumed per [facility]. Wife brought to bedside.

## 2017-05-12 NOTE — Progress Notes (Signed)
To holding area.  Assessment completed and questions answered.

## 2017-05-12 NOTE — H&P (Signed)
Patient was seen in pre-op.  I have reviewed the history and physical.  I have examined the patient today.  There are no changes noted from the H & P available in chart.

## 2017-05-12 NOTE — Other (Signed)
Patient Acct Nbr:  [id number]  Primary AUTH/CERT:    [place] Name:   [place]  Primary Insurance Plan Name:  [facility] INPT/OUTPT  Primary Insurance Group Number:    Primary Insurance Plan Type: [facility]  Primary Insurance Policy Number:  [id number]    Secondary AUTH/CERT:    [place] Name:   [place] Name:  [place]  Secondary Insurance Group Number:    Secondary Insurance Plan Type: C  Secondary Insurance Policy Number:  [id number]

## 2017-05-12 NOTE — Other (Signed)
This nurse has contacted Dr. Rory Percy regarding oozing of L groin site and high BP, Apresoline given and this nurse is to hold 15 minutes of pressure to site.

## 2017-05-13 LAB — BASIC METABOLIC PANEL W/ REFLEX TO MG FOR LOW K
Anion Gap: 16 (ref 4–16)
BUN: 13 MG/DL (ref 6–23)
CO2: 22 MMOL/L (ref 21–32)
Calcium: 8.7 MG/DL (ref 8.3–10.6)
Chloride: 101 mMol/L (ref 99–110)
Creatinine: 0.8 MG/DL — ABNORMAL LOW (ref 0.9–1.3)
GFR African American: >60 mL/min/{1.73_m2} (ref >60)
GFR Non-African American: >60 mL/min/{1.73_m2} (ref >60)
Glucose: 153 MG/DL — ABNORMAL HIGH (ref 70–99)
Potassium: 4.3 MMOL/L (ref 3.5–5.1)
Sodium: 139 MMOL/L (ref 135–145)

## 2017-05-13 LAB — CBC
Hematocrit: 40 % — ABNORMAL LOW (ref 42–52)
Hemoglobin: 13.7 GM/DL (ref 13.5–18.0)
MCH: 33.1 PG — ABNORMAL HIGH (ref 27–31)
MCHC: 34.3 % (ref 32.0–36.0)
MCV: 96.6 FL (ref 78–100)
MPV: 10 FL (ref 7.5–11.1)
Platelets: 184 10*3/uL (ref 140–440)
RBC: 4.14 10*6/uL — ABNORMAL LOW (ref 4.6–6.2)
RDW: 13 % (ref 11.7–14.9)
WBC: 13.7 10*3/uL — ABNORMAL HIGH (ref 4.0–10.5)

## 2017-05-13 MED ORDER — ACETAMINOPHEN 325 MG PO TABS
325 MG | ORAL_TABLET | ORAL | 3 refills | Status: AC | PRN
Start: 2017-05-13 — End: ?

## 2017-05-13 MED FILL — METOPROLOL TARTRATE 25 MG PO TABS: 25 MG | ORAL | Qty: 1

## 2017-05-13 MED FILL — TRAZODONE HCL 50 MG PO TABS: 50 MG | ORAL | Qty: 2

## 2017-05-13 MED FILL — ASPIR-LOW 81 MG PO TBEC: 81 MG | ORAL | Qty: 1

## 2017-05-13 MED FILL — SODIUM CHLORIDE 0.45 % IV SOLN: 0.45 % | INTRAVENOUS | Qty: 1000

## 2017-05-13 MED FILL — TYLENOL 325 MG PO TABS: 325 MG | ORAL | Qty: 2

## 2017-05-13 NOTE — Plan of Care (Signed)
Problem: Infection:  Goal: Will remain free from infection  Will remain free from infection   Outcome: Ongoing      Problem: Safety:  Goal: Free from accidental physical injury  Free from accidental physical injury   Outcome: Ongoing      Problem: Daily Care:  Goal: Daily care needs are met  Daily care needs are met   Outcome: Ongoing      Problem: Infection - Surgical Site:  Goal: Will show no infection signs and symptoms  Will show no infection signs and symptoms   Outcome: Ongoing      Problem: Pain:  Goal: Pain level will decrease  Pain level will decrease   Outcome: Ongoing    Goal: Control of acute pain  Control of acute pain   Outcome: Ongoing    Goal: Control of chronic pain  Control of chronic pain   Outcome: Ongoing

## 2017-05-13 NOTE — Other (Signed)
Pt able to ambulate in hallway the whole ICU unit.  Pt tolerated well.  No pain, SOB, chest pain.  Groin sites have no redness or bleeding, dressing intact.  Wife at the bedside.  Discharge orders placed. This nurse will begin discharge of Pt.

## 2017-05-13 NOTE — Anesthesia Post-Procedure Evaluation (Signed)
Anesthesia Post-op Note    Patient: Leon Nguyen  MRN: [id number]  Birthdate: [birth year]  Date of evaluation: 05/18/2017  Time:  7:53 AM     Procedure(s) Performed:     Last Vitals: BP 139/69   Pulse 82   Temp 97.7 F (36.5 C) (Oral)   Resp 16   Ht 5\' 10"  (1.778 m)   Wt 190 lb 14.7 oz (86.6 kg)   SpO2 96%   BMI 27.39 kg/m     Aldrete Phase I: Aldrete Score: 9    Aldrete Phase II:      Anesthesia Post Evaluation    Final anesthesia type: general  Patient location during evaluation: PACU  Patient participation: complete - patient participated  Level of consciousness: awake and alert  Pain score: 2  Airway patency: patent  Nausea & Vomiting: no nausea and no vomiting  Complications: no  Cardiovascular status: hemodynamically stable  Respiratory status: acceptable  Hydration status: euvolemic        Maeola Harman, MD  7:53 AM

## 2017-05-13 NOTE — Discharge Summary (Signed)
Discharge Summary     Patient ID  Leon Nguyen   10/24/1931  [id number]      Primary Care Doctor:  Patrina Levering, MD    Admit date: 05/12/2017   Discharge date: 05/13/2017      Admitting Physician: Aquilla Solian, MD   Discharge Physician: Earnest Rosier MD    Discharge Diagnoses:   Active Hospital Problems    Diagnosis Date Noted   . AAA (abdominal aortic aneurysm) ([place]) [I71.4] 04/15/2015     Discharged Condition: stable.    Hospital Course:  Upon admission underwent surgery of EVAR after discussion and preparation.  Tolerated surgery well   Post op ; monitored rhythm, O2 sat, I/O, Labs, CXRs serially  Neuro status , BP and vital signs monitored  Started on diet and activity.  Uneventful recovery  At discharge, pt ambulating  Tolerating diet  Wounds clean  Had Bm  Lungs clear   NSR  VS at discharge   Vitals:    05/13/17 1123   BP:    Pulse: 82   Resp:    Temp:    SpO2:        Consults: none    Significant Diagnostic Studies: None    Disposition: home    Patient Instructions:      Medication List      START taking these medications    acetaminophen 325 MG tablet  Commonly known as:  TYLENOL  Take 1 tablet by mouth every 4 hours as needed for Pain        CONTINUE taking these medications    aspirin 81 MG EC tablet     metoprolol tartrate 25 MG tablet  Commonly known as:  LOPRESSOR     traZODone 100 MG tablet  Commonly known as:  DESYREL     UNABLE TO FIND           Where to Get Your Medications      You can get these medications from any pharmacy    You don't need a prescription for these medications   acetaminophen 325 MG tablet       Activity: activity as tolerated and no lifting, Driving, or Strenuous exercise until seen by physician in office  Diet: cardiac diet  Wound Care: as directed    Follow-up as directed at discharge    Signed: Kyliana Standen    Time spent on discharge 35 minutes

## 2017-05-13 NOTE — Other (Signed)
D/c papers signed by Pt.  Wife at bedside for instructions. Pt states that he has an area in his soft palate that is still sore and has skin irritation.  He will watch the place and address with his doctor after he goes home if irritation persists.  Pt A+Ox4.  Pt moving all extremities.  Pt discharged with all documented belongings and via wheelchair.  Wife picked up at the door.  Pt's VS stable.  Pt has hearing aids in ears.

## 2017-05-13 NOTE — Plan of Care (Signed)
Problem: Infection:  Goal: Will remain free from infection  Will remain free from infection   Outcome: Met This Shift      Problem: Safety:  Goal: Free from accidental physical injury  Free from accidental physical injury   Outcome: Met This Shift    Goal: Free from intentional harm  Free from intentional harm   Outcome: Completed Date Met: 05/13/17      Problem: Daily Care:  Goal: Daily care needs are met  Daily care needs are met   Outcome: Met This Shift      Problem: Infection - Surgical Site:  Goal: Will show no infection signs and symptoms  Will show no infection signs and symptoms  Outcome: Met This Shift      Problem: Pain:  Goal: Pain level will decrease  Pain level will decrease  Outcome: Met This Shift    Goal: Control of acute pain  Control of acute pain  Outcome: Met This Shift    Goal: Control of chronic pain  Control of chronic pain  Outcome: Met This Shift

## 2017-05-14 MED FILL — EPHEDRINE SULFATE 50 MG/ML IJ SOLN: 50 MG/ML | INTRAMUSCULAR | Qty: 1

## 2017-05-14 MED FILL — FENTANYL CITRATE (PF) 100 MCG/2ML IJ SOLN: 100 MCG/2ML | INTRAMUSCULAR | Qty: 2

## 2017-05-15 MED FILL — ROCURONIUM BROMIDE 50 MG/5ML IV SOLN: 50 MG/5ML | INTRAVENOUS | Qty: 5

## 2017-05-15 MED FILL — ONDANSETRON HCL 4 MG/2ML IJ SOLN: 4 MG/2ML | INTRAMUSCULAR | Qty: 2

## 2017-05-15 MED FILL — DEXAMETHASONE SODIUM PHOSPHATE 4 MG/ML IJ SOLN: 4 MG/ML | INTRAMUSCULAR | Qty: 2

## 2017-05-15 MED FILL — PROPOFOL 200 MG/20ML IV EMUL: 200 MG/20ML | INTRAVENOUS | Qty: 20

## 2017-05-15 MED FILL — GLYCOPYRROLATE 0.4 MG/2ML IJ SOLN: 0.4 MG/2ML | INTRAMUSCULAR | Qty: 4

## 2017-05-15 MED FILL — LIDOCAINE HCL (CARDIAC) 20 MG/ML IV SOLN: 20 MG/ML | INTRAVENOUS | Qty: 5

## 2017-05-15 MED FILL — HEPARIN SODIUM (PORCINE) 1000 UNIT/ML IJ SOLN: 1000 UNIT/ML | INTRAMUSCULAR | Qty: 10

## 2017-05-15 MED FILL — NEOSTIGMINE METHYLSULFATE 10 MG/10ML IV SOLN: 10 MG/ML | INTRAVENOUS | Qty: 10

## 2017-05-31 ENCOUNTER — Ambulatory Visit: Admit: 2017-05-31 | Discharge: 2017-05-31 | Payer: MEDICARE | Attending: Surgery | Primary: Family Medicine

## 2017-05-31 DIAGNOSIS — I714 Abdominal aortic aneurysm, without rupture, unspecified: Secondary | ICD-10-CM

## 2017-05-31 NOTE — Progress Notes (Addendum)
Leon Nguyen   12/15/1930   83 y.o.  male    Chief Complaint   Patient presents with   . Post-Op Check     EVAR         HPI:  Patient is being seen in the office today post EVAR done 05/12/17. Patient doing well.  He feels that his abdominal distension and bladder issues are better.      Past Medical History:   Diagnosis Date   . AAA (abdominal aortic aneurysm) ([place])    . Dental crowns present     Upper And Lower   . Enlarged prostate    . HOH (hard of hearing)     Bilateral Hearing Aids   . Hyperlipidemia    . Hypertension    . Nocturia    . Shingles Dx 2017    Left Side Of Face   . Shortness of breath on exertion    . Wears glasses    . Wears hearing aid     Bilateral       Past Surgical History:   Procedure Laterality Date   . ABDOMINAL AORTIC ANEURYSM REPAIR, ENDOVASCULAR  05/12/2017   . APPENDECTOMY  1970   . COLONOSCOPY  Last Done In 2000's    Polyps Removed In Past   . JOINT REPLACEMENT Left 2017    Total Left Shoulder     No Known Allergies  Current Outpatient Prescriptions   Medication Sig Dispense Refill   . acetaminophen (TYLENOL) 325 MG tablet Take 1 tablet by mouth every 4 hours as needed for Pain 120 tablet 3   . aspirin 81 MG EC tablet Take 81 mg by mouth every morning Over The Counter     . UNABLE TO FIND Take by mouth 2 times daily Over The Counter Osteo Bi Flex     . metoprolol tartrate (LOPRESSOR) 25 MG tablet Take 25 mg by mouth 2 times daily     . traZODone (DESYREL) 100 MG tablet Take 100 mg by mouth nightly       No current facility-administered medications for this visit.       Family History   Problem Relation Age of Onset   . Thyroid Disease Mother    . Anxiety Disorder Mother    . Depression Mother    . Cancer Brother         Lung Cancer   . Heart Disease Brother         Heart Stents       Review of Systems:       Review of Systems   All other systems reviewed and are negative.      Physical Exam:  BP (!) 158/86   Pulse 66   Ht 5\' 11"  (1.803 m)   Wt 189 lb (85.7 kg)   BMI 26.36 kg/m    Physical Exam   Constitutional: He appears well-developed and well-nourished.   HENT:   Head: Normocephalic and atraumatic.   Eyes: EOM are normal.   Neck: No tracheal deviation present.   Cardiovascular: Normal rate and regular rhythm.    Pulmonary/Chest: Effort normal and breath sounds normal.   Abdominal: Soft. He exhibits no distension.   Musculoskeletal: He exhibits no edema.   Neurological:   Grossly intact   Skin: Skin is warm and dry.   Psychiatric: He has a normal mood and affect.        Extremities:  No edema, clubbing or cyanosis    Diagnostic Testing:  Assessment:         Problem List Items Addressed This Visit     AAA (abdominal aortic aneurysm) ([place]) - Primary    Relevant Orders    CTA Abdomen Pelvis W WO Contrast          Plan:    Doing well.  Plan for routine post EVAR surveillance.  Will check routine postop CTA A/P in next couple weeks with plans to alternate duplex after that. Resume regular activity.     Return for Follow up after CT, Continue current meds, Exercise, Follow low cholesterol/low sodium diet.

## 2017-06-01 NOTE — Telephone Encounter (Signed)
CTA Abd/Pelvis order faxed to [facility] at [phone]

## 2017-06-21 ENCOUNTER — Ambulatory Visit: Admit: 2017-06-21 | Discharge: 2017-06-21 | Payer: MEDICARE | Attending: Surgery | Primary: Family Medicine

## 2017-06-21 DIAGNOSIS — I714 Abdominal aortic aneurysm, without rupture, unspecified: Secondary | ICD-10-CM

## 2017-06-21 NOTE — Progress Notes (Signed)
Leon Nguyen   [birth year]   83 y.o.  male    Chief Complaint   Patient presents with   . Post-Op Check     EVAR   . Results     CT-A  Abdomen / Pelvis         HPI:  ost-operative evaluation from recent EVAR done on 05/12/17. Pt is here today to discuss follow-up CT-A Abdomen and Pelvis.  Doing well.      Past Medical History:   Diagnosis Date   . AAA (abdominal aortic aneurysm) ([place])    . Dental crowns present     Upper And Lower   . Enlarged prostate    . HOH (hard of hearing)     Bilateral Hearing Aids   . Hyperlipidemia    . Hypertension    . Nocturia    . Shingles Dx 2017    Left Side Of Face   . Shortness of breath on exertion    . Wears glasses    . Wears hearing aid     Bilateral       Past Surgical History:   Procedure Laterality Date   . ABDOMINAL AORTIC ANEURYSM REPAIR, ENDOVASCULAR  05/12/2017   . APPENDECTOMY  1970   . COLONOSCOPY  Last Done In 2000's    Polyps Removed In Past   . HERNIA REPAIR     . JOINT REPLACEMENT Left 2017    Total Left Shoulder   . VASECTOMY       No Known Allergies  Current Outpatient Prescriptions   Medication Sig Dispense Refill   . aspirin 81 MG EC tablet Take 81 mg by mouth every morning Over The Counter     . metoprolol tartrate (LOPRESSOR) 25 MG tablet Take 25 mg by mouth 2 times daily     . traZODone (DESYREL) 100 MG tablet Take 100 mg by mouth nightly     . acetaminophen (TYLENOL) 325 MG tablet Take 1 tablet by mouth every 4 hours as needed for Pain 120 tablet 3   . UNABLE TO FIND Take by mouth 2 times daily Over The Counter Osteo Bi Flex       No current facility-administered medications for this visit.       Family History   Problem Relation Age of Onset   . Thyroid Disease Mother    . Anxiety Disorder Mother    . Depression Mother    . Cancer Brother         Lung Cancer   . Heart Disease Brother         Heart Stents       Review of Systems:       Review of Systems    Physical Exam:  BP (!) 150/86 (Site: Left Arm)   Pulse 84   Ht 5\' 11"  (1.803 m)   Wt 191 lb 3.2 oz  (86.7 kg)   BMI 26.67 kg/m   Physical Exam   Constitutional: He appears well-developed and well-nourished.   HENT:   Head: Normocephalic and atraumatic.   Eyes: EOM are normal.   Neck: No tracheal deviation present.   Cardiovascular: Normal rate and regular rhythm.    Pulmonary/Chest: Effort normal and breath sounds normal.   Abdominal: Soft. He exhibits no distension.   Musculoskeletal: He exhibits no edema.   Neurological:   Grossly intact   Skin: Skin is warm and dry.   Psychiatric: He has a normal mood and affect.  Extremities:  No edema, clubbing or cyanosis         Diagnostic Testing:       CTA--4.4 x 4.2 aneurysm sac, previously 5.2 x 4.7cm; no evidence of endoleak    Assessment:        Problem List Items Addressed This Visit     AAA (abdominal aortic aneurysm) ([place]) - Primary    Relevant Orders    VL Abdominal AORTA Duplex Scan          Plan:    Doing well.  Plan for routine post EVAR surveillance.  Will check duplex in 6 months with plans to alternate CT and [place].     Return in about 6 months (around 12/22/2017) for Follow up after ultrasound, Follow up as needed, Follow up with PCP, Follow low cholesterol/low sodi.

## 2017-11-01 ENCOUNTER — Encounter: Admit: 2017-11-01 | Discharge: 2017-11-01 | Payer: MEDICARE | Primary: Family Medicine

## 2017-11-07 ENCOUNTER — Ambulatory Visit: Admit: 2017-11-07 | Discharge: 2017-11-07 | Payer: MEDICARE | Attending: Surgery | Primary: Family Medicine

## 2017-11-07 DIAGNOSIS — I714 Abdominal aortic aneurysm, without rupture, unspecified: Secondary | ICD-10-CM

## 2017-11-07 NOTE — Progress Notes (Signed)
Leon Nguyen   Nov 09, 1931   83 y.o.  male    Chief Complaint   Patient presents with   . Results     [place] AA         HPI:  Patient is being seen today to discuss AA [place] results done in the office 11/01/17. No complaints.  He's leaving for cruise after holidays.      Past Medical History:   Diagnosis Date   . AAA (abdominal aortic aneurysm) ([place])    . Dental crowns present     Upper And Lower   . Enlarged prostate    . HOH (hard of hearing)     Bilateral Hearing Aids   . Hyperlipidemia    . Hypertension    . Nocturia    . Shingles Dx 2017    Left Side Of Face   . Shortness of breath on exertion    . Wears glasses    . Wears hearing aid     Bilateral       Past Surgical History:   Procedure Laterality Date   . ABDOMINAL AORTIC ANEURYSM REPAIR, ENDOVASCULAR  05/12/2017   . APPENDECTOMY  1970   . COLONOSCOPY  Last Done In 2000's    Polyps Removed In Past   . HERNIA REPAIR     . JOINT REPLACEMENT Left 2017    Total Left Shoulder   . VASECTOMY       No Known Allergies  Current Outpatient Prescriptions   Medication Sig Dispense Refill   . acetaminophen (TYLENOL) 325 MG tablet Take 1 tablet by mouth every 4 hours as needed for Pain 120 tablet 3   . aspirin 81 MG EC tablet Take 81 mg by mouth every morning Over The Counter     . UNABLE TO FIND Take by mouth 2 times daily Over The Counter Osteo Bi Flex     . metoprolol tartrate (LOPRESSOR) 25 MG tablet Take 25 mg by mouth 2 times daily     . traZODone (DESYREL) 100 MG tablet Take 100 mg by mouth nightly       No current facility-administered medications for this visit.       Family History   Problem Relation Age of Onset   . Thyroid Disease Mother    . Anxiety Disorder Mother    . Depression Mother    . Cancer Brother         Lung Cancer   . Heart Disease Brother         Heart Stents       Review ofSystems:       Review of Systems   All other systems reviewed and are negative.      Physical Exam:  BP (!) 158/80   Pulse 83   Ht 5\' 10"  (1.778 m)   Wt 193 lb (87.5 kg)   BMI  27.69 kg/m   Physical Exam   Constitutional: He appears well-developed and well-nourished.   HENT:   Head: Normocephalic and atraumatic.   Eyes: EOM are normal.   Neck: No tracheal deviation present.   Cardiovascular: Normal rate and regular rhythm.    Pulmonary/Chest: Effort normal and breath sounds normal.   Abdominal: Soft. He exhibits no distension.   Musculoskeletal: He exhibits no edema.   Neurological:   Grossly intact   Skin: Skin is warm and dry.   Psychiatric: He has a normal mood and affect.        Extremities:  No  edema, clubbing or cyanosis    Diagnostic Testing:       Duplex--no endoleak, 4.4 x4.2 cm sac    Assessment:        Problem List Items Addressed This Visit     AAA (abdominal aortic aneurysm) ([place]) - Primary    Relevant Orders    CTA Abdomen Pelvis W WO Contrast          Plan:    Doing well.  Will plan ongoing surveillance post EVAR.  Check CTA in 1 year with plans to alternate with duplex.      Return in about 1 year (around 11/07/2018) for Follow up after CT, Continue current meds, Follow low cholesterol/low sodium diet, Follow up with PC.

## 2018-02-07 ENCOUNTER — Encounter: Primary: Family Medicine

## 2018-02-14 ENCOUNTER — Encounter: Attending: Surgery | Primary: Family Medicine

## 2018-11-07 ENCOUNTER — Ambulatory Visit: Primary: Family Medicine

## 2018-11-08 ENCOUNTER — Ambulatory Visit: Admit: 2018-11-08 | Discharge: 2018-11-08 | Payer: MEDICARE | Attending: Vascular Surgery | Primary: Family Medicine

## 2018-11-08 NOTE — Progress Notes (Unsigned)
Leon Nguyen   18-Nov-1931   84 y.o.  male    Chief Complaint   Patient presents with   ??? Follow-up     AAA   ??? Results     CTA A&P           HPI:  Patient is being seen today for f/u to discuss AAA.  CT completed at [facility].     Past Medical History:   Diagnosis Date   ??? AAA (abdominal aortic aneurysm) ([place])    ??? Dental crowns present     Upper And Lower   ??? Enlarged prostate    ??? HOH (hard of hearing)     Bilateral Hearing Aids   ??? Hyperlipidemia    ??? Hypertension    ??? Nocturia    ??? Shingles Dx 2017    Left Side Of Face   ??? Shortness of breath on exertion    ??? Wears glasses    ??? Wears hearing aid     Bilateral       Past Surgical History:   Procedure Laterality Date   ??? ABDOMINAL AORTIC ANEURYSM REPAIR, ENDOVASCULAR  05/12/2017   ??? APPENDECTOMY  1970   ??? COLONOSCOPY  Last Done In 2000's    Polyps Removed In Past   ??? HERNIA REPAIR     ??? JOINT REPLACEMENT Left 2017    Total Left Shoulder   ??? VASECTOMY       Social History     Socioeconomic History   ??? Marital status: Married     Spouse name: Not on file   ??? Number of children: Not on file   ??? Years of education: Not on file   ??? Highest education level: Not on file   Occupational History   ??? Not on file   Social Needs   ??? Financial resource strain: Not on file   ??? Food insecurity:     Worry: Not on file     Inability: Not on file   ??? Transportation needs:     Medical: Not on file     Non-medical: Not on file   Tobacco Use   ??? Smoking status: Former Smoker     Packs/day: 0.50     Years: 2.00     Pack years: 1.00     Types: Cigarettes     Start date: 1996     Last attempt to quit: 1998     Years since quitting: 21.9   ??? Smokeless tobacco: Never Used   Substance and Sexual Activity   ??? Alcohol use: Yes     Comment: "Occ. Maybe Once A Week"   ??? Drug use: No   ??? Sexual activity: Yes     Partners: Female   Lifestyle   ??? Physical activity:     Days per week: Not on file     Minutes per session: Not on file   ??? Stress: Not on file   Relationships   ??? Social connections:     Talks  on phone: Not on file     Gets together: Not on file     Attends religious service: Not on file     Active member of club or organization: Not on file     Attends meetings of clubs or organizations: Not on file     Relationship status: Not on file   ??? Intimate partner violence:     Fear of current or ex partner: Not on file  Emotionally abused: Not on file     Physically abused: Not on file     Forced sexual activity: Not on file   Other Topics Concern   ??? Not on file   Social History Narrative   ??? Not on file     No Known Allergies  Current Outpatient Medications   Medication Sig Dispense Refill   ??? acetaminophen (TYLENOL) 325 MG tablet Take 1 tablet by mouth every 4 hours as needed for Pain 120 tablet 3   ??? aspirin 81 MG EC tablet Take 81 mg by mouth every morning Over The Counter     ??? UNABLE TO FIND Take by mouth 2 times daily Over The Counter Osteo Bi Flex     ??? metoprolol tartrate (LOPRESSOR) 25 MG tablet Take 25 mg by mouth 2 times daily     ??? traZODone (DESYREL) 100 MG tablet Take 100 mg by mouth nightly       No current facility-administered medications for this visit.       Family History   Problem Relation Age of Onset   ??? Thyroid Disease Mother    ??? Anxiety Disorder Mother    ??? Depression Mother    ??? Cancer Brother         Lung Cancer   ??? Heart Disease Brother         Heart Stents       Review ofSystems:       Review of Systems   Constitutional: Negative.    HENT: Negative.    Eyes: Negative.    Respiratory: Negative.    Cardiovascular: Negative.    Gastrointestinal: Negative.    Endocrine: Negative.    Genitourinary: Negative.    Musculoskeletal: Negative.    Skin: Negative.    Allergic/Immunologic: Negative.    Neurological: Negative.    Hematological: Negative.    Psychiatric/Behavioral: Negative.        Physical Exam:  BP (!) 154/86    Pulse 78    Ht 6\' 1"  (1.854 m)    Wt 186 lb (84.4 kg)    SpO2 96% Comment: RA   BMI 24.54 kg/m??   Physical Exam     Extremities:  *** edema, no clubbing or  cyanosis    Vascular exam: *** pulses    Diagnostic Testing:       ***Images reviewed  ***    Assessment:        Problem List Items Addressed This Visit     None          Plan:    ***    No follow-ups on file.

## 2019-05-21 NOTE — Progress Notes (Signed)
This encounter was created in error - please disregard.

## 2019-07-17 ENCOUNTER — Ambulatory Visit: Admit: 2019-07-17 | Discharge: 2019-07-17 | Payer: MEDICARE | Attending: Surgery | Primary: Family Medicine

## 2019-07-17 DIAGNOSIS — D485 Neoplasm of uncertain behavior of skin: Secondary | ICD-10-CM

## 2019-07-24 ENCOUNTER — Ambulatory Visit: Admit: 2019-07-24 | Discharge: 2019-07-24 | Payer: MEDICARE | Attending: Surgery | Primary: Family Medicine

## 2019-07-24 DIAGNOSIS — D485 Neoplasm of uncertain behavior of skin: Secondary | ICD-10-CM

## 2019-07-24 NOTE — Patient Instructions (Signed)
Richard M. Nedelman, M.D., F.A.C.S.  Jennifer M. Daniels, M.D., F.A.C.S.  Steven E. Conkel, M.D.,F.A.C.S.      [address], [address], [place] [address]    Phone [phone]  Fax [phone]  [email or web address]     Incision Care:  Keep dressing dry for 24 hours. You may then shower and blot area dry.  Apply Antibiotic ointment with a band-aid.    Pain:  If you experience any discomfort, take Advil, or Aleve, or Tylenol.  Do Not take Aspirin unless instructed by your surgeon.    Special Instructions:  If you develop severe pain, or redness, or swelling, or fever or chills please call the office.    Follow Up:  Follow up with the surgeon as instructed, usually 7-10 days.

## 2019-07-25 DIAGNOSIS — D485 Neoplasm of uncertain behavior of skin: Secondary | ICD-10-CM

## 2019-07-25 NOTE — Progress Notes (Signed)
Brief Postoperative Note    Leon Nguyen  Date of Birth:  Dec 29, 1930  [id number]    Pre-operative Diagnosis:   1. 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    Anesthesia:Local     Surgeon: Vicie Mutters Yarden Hillis    Estimated Blood Loss: Minimal    Complications: none        Vicie Mutters Loretha Ure

## 2019-07-26 NOTE — Addendum Note (Signed)
Addended by: Wyline Mood on: 07/26/2019 01:29 PM     Modules accepted: Orders

## 2019-07-27 NOTE — Progress Notes (Signed)
Subjective:       Leon Nguyen is a 85 y.o. male who was referred to me from Dr Ova Freshwater for evaluation and treatment of a skin lesion of the hand. The lesion has been present for a few months. Lesion has changed in a few months. Symptoms associated with the lesion are: increasing diameter, increasing thickness, tendency to be traumatized, unsightliness. Patient denies darkening color, itching, bleeding, drainage, infection.  He reports no prior history of having any other skin lesions removed.    Past Medical History:   Diagnosis Date   ??? AAA (abdominal aortic aneurysm) ([place])    ??? Dental crowns present     Upper And Lower   ??? Enlarged prostate    ??? HOH (hard of hearing)     Bilateral Hearing Aids   ??? Hyperlipidemia    ??? Hypertension    ??? Nocturia    ??? Shingles Dx 2017    Left Side Of Face   ??? Shortness of breath on exertion    ??? Wears glasses    ??? Wears hearing aid     Bilateral      Patient Active Problem List    Diagnosis Date Noted   ??? Neoplasm of uncertain behavior of skin of hand 07/25/2019   ??? AAA (abdominal aortic aneurysm) ([place]) 04/15/2015     Past Surgical History:   Procedure Laterality Date   ??? ABDOMINAL AORTIC ANEURYSM REPAIR, ENDOVASCULAR  05/12/2017   ??? APPENDECTOMY  1970   ??? COLONOSCOPY  Last Done In 2000's    Polyps Removed In Past   ??? HERNIA REPAIR     ??? JOINT REPLACEMENT Left 2017    Total Left Shoulder   ??? VASECTOMY       Family History   Problem Relation Age of Onset   ??? Thyroid Disease Mother    ??? Anxiety Disorder Mother    ??? Depression Mother    ??? Cancer Brother         Lung Cancer   ??? Heart Disease Brother         Heart Stents     Social History     Socioeconomic History   ??? Marital status: Married     Spouse name: None   ??? Number of children: None   ??? Years of education: None   ??? Highest education level: None   Occupational History   ??? None   Social Needs   ??? Financial resource strain: None   ??? Food insecurity     Worry: None     Inability: None   ??? Transportation needs     Medical: None      Non-medical: None   Tobacco Use   ??? Smoking status: Former Smoker     Packs/day: 0.50     Years: 2.00     Pack years: 1.00     Types: Cigarettes     Start date: 1996     Last attempt to quit: 1998     Years since quitting: 22.6   ??? Smokeless tobacco: Never Used   Substance and Sexual Activity   ??? Alcohol use: Yes     Comment: "Occ. Maybe Once A Week"   ??? Drug use: No   ??? Sexual activity: Yes     Partners: Female   Lifestyle   ??? Physical activity     Days per week: None     Minutes per session: None   ??? Stress: None   Relationships   ???  Social Product manager on phone: None     Gets together: None     Attends religious service: None     Active member of club or organization: None     Attends meetings of clubs or organizations: None     Relationship status: None   ??? Intimate partner violence     Fear of current or ex partner: None     Emotionally abused: None     Physically abused: None     Forced sexual activity: None   Other Topics Concern   ??? None   Social History Narrative   ??? None     Current Outpatient Medications   Medication Sig Dispense Refill   ??? acetaminophen (TYLENOL) 325 MG tablet Take 1 tablet by mouth every 4 hours as needed for Pain 120 tablet 3   ??? aspirin 81 MG EC tablet Take 81 mg by mouth every morning Over The Counter     ??? UNABLE TO FIND Take by mouth 2 times daily Over The Counter Osteo Bi Flex     ??? metoprolol tartrate (LOPRESSOR) 25 MG tablet Take 25 mg by mouth 2 times daily     ??? traZODone (DESYREL) 100 MG tablet Take 100 mg by mouth nightly       No current facility-administered medications for this visit.      Current Outpatient Medications on File Prior to Visit   Medication Sig Dispense Refill   ??? acetaminophen (TYLENOL) 325 MG tablet Take 1 tablet by mouth every 4 hours as needed for Pain 120 tablet 3   ??? aspirin 81 MG EC tablet Take 81 mg by mouth every morning Over The Counter     ??? UNABLE TO FIND Take by mouth 2 times daily Over The Counter Osteo Bi Flex     ??? metoprolol  tartrate (LOPRESSOR) 25 MG tablet Take 25 mg by mouth 2 times daily     ??? traZODone (DESYREL) 100 MG tablet Take 100 mg by mouth nightly       No current facility-administered medications on file prior to visit.      No Known Allergies    Review of Systems  Pertinent items are noted in HPI.      Objective:      Physical Exam  General Skin Exam:  no rashes, no ecchymoses, no petechiae, no nodules, no jaundice, no purpura, no wounds, no acanthosis nigricans, no striae     Lesion    Location:   anterior left hand   Color:  milky    Diameter:  1 cm   Border/Symmetry:  elevated, ovoid, soft.   Inflammation:  absent   Adhesion:   adherent to adjacent tissues.       Assessment:      possible basal cell carcinoma of the left anterior hand. Surgical excision is recommended.      Plan:      1. Excise lesion(s) in the office under local anesthetic  2. Written patient instruction given.  3. Follow up in 1 weeks.

## 2019-07-31 ENCOUNTER — Ambulatory Visit: Admit: 2019-07-31 | Discharge: 2019-07-31 | Payer: MEDICARE | Attending: Surgery | Primary: Family Medicine

## 2019-07-31 DIAGNOSIS — Z09 Encounter for follow-up examination after completed treatment for conditions other than malignant neoplasm: Secondary | ICD-10-CM

## 2019-07-31 NOTE — Progress Notes (Signed)
Leon Nguyen 05/08/1931 is recovering uneventfully from surgery.  Pathology revealed that the skin lesion was not invasive squamous cell carcinoma.  All of the margins are clear.  I will remove the sutures this upcoming Monday.  The patient has been examined and all questioned addressed.

## 2019-08-05 ENCOUNTER — Encounter: Admit: 2019-08-05 | Discharge: 2019-08-05 | Payer: MEDICARE | Primary: Family Medicine

## 2019-08-05 DIAGNOSIS — Z4802 Encounter for removal of sutures: Secondary | ICD-10-CM

## 2019-08-05 NOTE — Progress Notes (Signed)
Patient came in today for a nurse visit to have suture removed. Suture were remove patient doing well.

## 2019-11-06 ENCOUNTER — Ambulatory Visit: Admit: 2019-11-06 | Discharge: 2019-11-06 | Payer: MEDICARE | Attending: Surgery | Primary: Family Medicine

## 2019-11-06 DIAGNOSIS — D485 Neoplasm of uncertain behavior of skin: Secondary | ICD-10-CM

## 2019-11-08 NOTE — Progress Notes (Signed)
Subjective:       Leon Nguyen is a 85 y.o. male who was self referred to me for evaluation and treatment of a skin lesion of the hand. The lesion has been present for a few months. Lesion has changed in a few weeks. Symptoms associated with the lesion are: increasing diameter, increasing thickness, itching, tendency to be traumatized. Patient denies darkening color, bleeding, drainage, infection.  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    Past Medical History:   Diagnosis Date   ??? AAA (abdominal aortic aneurysm) ([place])    ??? Dental crowns present     Upper And Lower   ??? Enlarged prostate    ??? HOH (hard of hearing)     Bilateral Hearing Aids   ??? Hyperlipidemia    ??? Hypertension    ??? Nocturia    ??? Shingles Dx 2017    Left Side Of Face   ??? Shortness of breath on exertion    ??? Wears glasses    ??? Wears hearing aid     Bilateral      Patient Active Problem List    Diagnosis Date Noted   ??? Neoplasm of uncertain behavior of skin of hand 07/25/2019   ??? AAA (abdominal aortic aneurysm) ([place]) 04/15/2015     Past Surgical History:   Procedure Laterality Date   ??? ABDOMINAL AORTIC ANEURYSM REPAIR, ENDOVASCULAR  05/12/2017   ??? APPENDECTOMY  1970   ??? COLONOSCOPY  Last Done In 2000's    Polyps Removed In Past   ??? HERNIA REPAIR     ??? JOINT REPLACEMENT Left 2017    Total Left Shoulder   ??? VASECTOMY       Family History   Problem Relation Age of Onset   ??? Thyroid Disease Mother    ??? Anxiety Disorder Mother    ??? Depression Mother    ??? Cancer Brother         Lung Cancer   ??? Heart Disease Brother         Heart Stents     Social History     Socioeconomic History   ??? Marital status: Married     Spouse name: None   ??? Number of children: None   ??? Years of education: None   ??? Highest education level: None   Occupational History   ??? None   Social Needs   ??? Financial resource strain: None   ??? Food insecurity     Worry: None     Inability: None   ??? Transportation needs      Medical: None     Non-medical: None   Tobacco Use   ??? Smoking status: Former Smoker     Packs/day: 0.50     Years: 2.00     Pack years: 1.00     Types: Cigarettes     Start date: 1996     Quit date: 1998     Years since quitting: 22.9   ??? Smokeless tobacco: Never Used   Substance and Sexual Activity   ??? Alcohol use: Yes     Comment: "Occ. Maybe Once A Week"   ??? Drug use: No   ??? Sexual activity: Yes     Partners: Female   Lifestyle   ??? Physical activity     Days per week: None     Minutes per session: None   ??? Stress: None  Relationships   ??? Social Product manager on phone: None     Gets together: None     Attends religious service: None     Active member of club or organization: None     Attends meetings of clubs or organizations: None     Relationship status: None   ??? Intimate partner violence     Fear of current or ex partner: None     Emotionally abused: None     Physically abused: None     Forced sexual activity: None   Other Topics Concern   ??? None   Social History Narrative   ??? None     Current Outpatient Medications   Medication Sig Dispense Refill   ??? acetaminophen (TYLENOL) 325 MG tablet Take 1 tablet by mouth every 4 hours as needed for Pain 120 tablet 3   ??? aspirin 81 MG EC tablet Take 81 mg by mouth every morning Over The Counter     ??? UNABLE TO FIND Take by mouth 2 times daily Over The Counter Osteo Bi Flex     ??? metoprolol tartrate (LOPRESSOR) 25 MG tablet Take 25 mg by mouth 2 times daily     ??? traZODone (DESYREL) 100 MG tablet Take 100 mg by mouth nightly       No current facility-administered medications for this visit.      Current Outpatient Medications on File Prior to Visit   Medication Sig Dispense Refill   ??? acetaminophen (TYLENOL) 325 MG tablet Take 1 tablet by mouth every 4 hours as needed for Pain 120 tablet 3   ??? aspirin 81 MG EC tablet Take 81 mg by mouth every morning Over The Counter     ??? UNABLE TO FIND Take by mouth 2 times daily Over The Counter Osteo Bi Flex     ??? metoprolol  tartrate (LOPRESSOR) 25 MG tablet Take 25 mg by mouth 2 times daily     ??? traZODone (DESYREL) 100 MG tablet Take 100 mg by mouth nightly       No current facility-administered medications on file prior to visit.      No Known Allergies    Review of Systems  Pertinent items are noted in HPI.      Objective:      Physical Exam  General Skin Exam:  no rashes, no ecchymoses, no petechiae, no nodules, no jaundice, no purpura, no wounds, no acanthosis nigricans, no striae     Lesion    Location:   lateral left hand   Color:  thick and white    Diameter:  1 cm   Border/Symmetry:  elevated, hard.   Inflammation:  absent   Adhesion:   adherent to adjacent tissues.       Assessment:      suspicious lesion of the left lateral hand. Surgical excision is recommended.      Plan:      1. Excise lesion(s) in the office under local anesthetic  2. Written patient instruction given.  3. Follow up in 2 weeks.

## 2019-11-25 ENCOUNTER — Ambulatory Visit: Admit: 2019-11-25 | Discharge: 2019-11-25 | Payer: MEDICARE | Attending: Surgery | Primary: Family Medicine

## 2019-11-25 DIAGNOSIS — D485 Neoplasm of uncertain behavior of skin: Secondary | ICD-10-CM

## 2019-11-25 NOTE — Patient Instructions (Signed)
Richard M. Nedelman, M.D., F.A.C.S.  Jennifer M. Daniels, M.D., F.A.C.S.  Steven E. Conkel, M.D.,F.A.C.S.      [address], [address], [place] [address]    Phone [phone]  Fax [phone]  [email or web address]     Incision Care:  Keep dressing dry for 24 hours. You may then shower and blot area dry.  Apply Antibiotic ointment with a band-aid.    Pain:  If you experience any discomfort, take Advil, or Aleve, or Tylenol.  Do Not take Aspirin unless instructed by your surgeon.    Special Instructions:  If you develop severe pain, or redness, or swelling, or fever or chills please call the office.    Follow Up:  Follow up with the surgeon as instructed, usually 7-10 days.

## 2019-11-25 NOTE — Progress Notes (Signed)
Brief Postoperative Note    Leon Nguyen  Date of Birth:  30-Nov-1930  [id number]    Pre-operative Diagnosis:   1. Neoplasm of uncertain behavior of skin of hand          Post-operative Diagnosis: Same    Procedure: Excision left dorsum of the hand neoplasm of uncertain behavior with intermediate closure measuring 1 cm x 3.5 cm.    Anesthesia:Local     Surgeon: Vicie Mutters Nakota Ackert    Estimated Blood Loss: Minimal    Complications: none        Vicie Mutters Shatera Rennert

## 2019-12-06 ENCOUNTER — Ambulatory Visit: Admit: 2019-12-06 | Discharge: 2019-12-06 | Payer: MEDICARE | Attending: Surgery | Primary: Family Medicine

## 2019-12-06 DIAGNOSIS — Z09 Encounter for follow-up examination after completed treatment for conditions other than malignant neoplasm: Secondary | ICD-10-CM

## 2019-12-06 NOTE — Progress Notes (Signed)
Sutures removed. Steri strips applied. Healing well.

## 2019-12-09 NOTE — Progress Notes (Signed)
Leon Nguyen 1931-11-20 is recovering uneventfully from surgery.  The second skin lesion that I removed from the dorsum of his left hand was another squamous cell carcinoma.  All surgical margins are negative for malignancy.  Sutures removed.  The patient has been examined and all questioned addressed.

## 2020-03-14 ENCOUNTER — Inpatient Hospital Stay
Admit: 2020-03-14 | Discharge: 2020-03-14 | Disposition: A | Discharge status: Home / Self Care | Payer: MEDICARE | Attending: Emergency Medicine

## 2020-03-14 ENCOUNTER — Emergency Department: Admit: 2020-03-14 | Payer: MEDICARE | Primary: Family Medicine

## 2020-03-14 DIAGNOSIS — R6889 Other general symptoms and signs: Secondary | ICD-10-CM

## 2020-03-14 LAB — CBC WITH AUTO DIFFERENTIAL
Basophils %: 0.6 % (ref 0–1)
Basophils Absolute: 0 10*3/uL
Eosinophils %: 0.6 % (ref 0–3)
Eosinophils Absolute: 0 10*3/uL
Hematocrit: 34.1 % — ABNORMAL LOW (ref 42–52)
Hemoglobin: 11.5 GM/DL — ABNORMAL LOW (ref 13.5–18.0)
Immature Neutrophil %: 0.2 % (ref 0–0.43)
Lymphocytes %: 19.3 % — ABNORMAL LOW (ref 24–44)
Lymphocytes Absolute: 1 10*3/uL
MCH: 33 PG — ABNORMAL HIGH (ref 27–31)
MCHC: 33.7 % (ref 32.0–36.0)
MCV: 98 FL (ref 78–100)
MPV: 10 FL (ref 7.5–11.1)
Monocytes %: 7.8 % — ABNORMAL HIGH (ref 0–4)
Monocytes Absolute: 0.4 10*3/uL
Neutrophils Absolute: 3.6 10*3/uL
Nucleated RBC %: 0 %
Platelets: 143 10*3/uL (ref 140–440)
RBC: 3.48 10*6/uL — ABNORMAL LOW (ref 4.6–6.2)
RDW: 13.4 % (ref 11.7–14.9)
Segs Relative: 71.5 % — ABNORMAL HIGH (ref 36–66)
Total Immature Neutrophil: 0.01 10*3/uL
Total Nucleated RBC: 0 10*3/uL
WBC: 5 10*3/uL (ref 4.0–10.5)

## 2020-03-14 LAB — URINALYSIS
Bacteria, UA: NEGATIVE /HPF
Bilirubin Urine: NEGATIVE MG/DL
Blood, Urine: NEGATIVE
Glucose, Urine: NEGATIVE MG/DL
Ketones, Urine: NEGATIVE MG/DL
Leukocyte Esterase, Urine: NEGATIVE
Nitrite Urine, Quantitative: NEGATIVE
Protein, UA: NEGATIVE MG/DL
RBC, UA: 1 /HPF (ref 0–3)
Specific Gravity, UA: 1.019 (ref 1.001–1.035)
Trichomonas: NONE SEEN /HPF
Urobilinogen, Urine: NEGATIVE MG/DL (ref 0.2–1.0)
WBC, UA: 1 /HPF (ref 0–2)
pH, Urine: 6 (ref 5.0–8.0)

## 2020-03-14 LAB — EKG 12-LEAD
Atrial Rate: 87 {beats}/min
Diagnosis: NORMAL
P Axis: 23 degrees
P-R Interval: 168 ms
Q-T Interval: 392 ms
QRS Duration: 110 ms
QTc Calculation (Bazett): 471 ms
R Axis: -11 degrees
T Axis: 111 degrees
Ventricular Rate: 87 {beats}/min

## 2020-03-14 LAB — TROPONIN: Troponin T: <0.01 NG/ML (ref <0.01)

## 2020-03-14 LAB — COMPREHENSIVE METABOLIC PANEL W/ REFLEX TO MG FOR LOW K
ALT: 20 U/L (ref 10–40)
AST: 21 IU/L (ref 15–37)
Albumin: 4.2 GM/DL (ref 3.4–5.0)
Alkaline Phosphatase: 72 IU/L (ref 40–129)
Anion Gap: 5 (ref 4–16)
BUN: 21 MG/DL (ref 6–23)
CO2: 30 MMOL/L (ref 21–32)
Calcium: 8.9 MG/DL (ref 8.3–10.6)
Chloride: 101 mMol/L (ref 99–110)
Creatinine: 1.1 MG/DL (ref 0.9–1.3)
GFR African American: >60 mL/min/{1.73_m2} (ref >60)
GFR Non-African American: >60 mL/min/{1.73_m2} (ref >60)
Glucose: 124 MG/DL — ABNORMAL HIGH (ref 70–99)
Potassium: 3.9 MMOL/L (ref 3.5–5.1)
Sodium: 136 MMOL/L (ref 135–145)
Total Bilirubin: 0.5 MG/DL (ref 0.0–1.0)
Total Protein: 6.6 GM/DL (ref 6.4–8.2)

## 2020-03-14 MED ORDER — METOPROLOL TARTRATE 5 MG/5ML IV SOLN
55 MG/ML | Freq: Once | INTRAVENOUS | Status: AC
Start: 2020-03-14 — End: 2020-03-14
  Administered 2020-03-14: 19:00:00 5 mg via INTRAVENOUS

## 2020-03-14 MED FILL — METOPROLOL TARTRATE 5 MG/5ML IV SOLN: 5 mg/mL | INTRAVENOUS | Qty: 5

## 2020-03-14 NOTE — ED Notes (Signed)
Manual bp 182/90       Linzi Ohlinger L. Brigitte Pulse, RN  03/14/20 1424

## 2020-03-14 NOTE — ED Notes (Signed)
Bed: ED-22  Expected date:   Expected time:   Means of arrival:   Comments:     Shearon Stalls, RN  03/14/20 1408

## 2020-03-14 NOTE — ED Provider Notes (Signed)
CHIEF COMPLAINT  Chief Complaint   Patient presents with   ??? Altered Mental Status       HPI  Leon Nguyen is a 86 y.o. male with history of AAA, hypertension, hyperlipidemia, enlarged prostate who presents forgetfulness that was noted this morning.  Patient could not remember that he was moving to [place], did not remember the year or the president and he normally is able to recall these things.  His wife states "he has a little bit of forgetfulness but nothing like this".  The patient states "I feel great, this is the best I felt in a long time".  Family at bedside states that he seems more calm than usual.  Patient has had no vomiting, cough, fever, urinary change, abdominal pain, diarrhea.  He was otherwise well, he states he had cereal for breakfast but he is unable to tell me the year or the president.  He has had increased anxiety and was recently started on Ativan, denies other medication changes.  He manages his own medications, last dose of Ativan was yesterday morning.  He does take a sleeping pill but took it at the normal time last night.    He does normally have hypertension but his usual blood pressure is 140/80, he is on metoprolol.      REVIEW OF SYSTEMS  Review of Systems   History obtained from chart review, the patient and wife at bedside  General ROS: negative for - chills or fever  Ophthalmic ROS: negative for - blurry vision, decreased vision or double vision  ENT ROS: negative for - headaches  Hematological and Lymphatic ROS: negative for - bleeding problems or bruising  Endocrine ROS: negative for - unexpected weight changes  Respiratory ROS: no cough, shortness of breath, or wheezing  Cardiovascular ROS: no chest pain or dyspnea on exertion  Gastrointestinal ROS: no abdominal pain, change in bowel habits, or black or bloody stools  Genito-Urinary ROS: no dysuria, trouble voiding, or hematuria  Musculoskeletal ROS: negative for - joint pain or joint stiffness  Neurological ROS: positive for  - confusion  negative for - behavioral changes, dizziness, gait disturbance, headaches, memory loss, seizures or weakness      PAST MEDICAL HISTORY  Past Medical History:   Diagnosis Date   ??? AAA (abdominal aortic aneurysm) (HCC)    ??? Dental crowns present     Upper And Lower   ??? Enlarged prostate    ??? HOH (hard of hearing)     Bilateral Hearing Aids   ??? Hyperlipidemia    ??? Hypertension    ??? Nocturia    ??? Shingles Dx 2017    Left Side Of Face   ??? Shortness of breath on exertion    ??? Wears glasses    ??? Wears hearing aid     Bilateral        FAMILY HISTORY  Family History   Problem Relation Age of Onset   ??? Thyroid Disease Mother    ??? Anxiety Disorder Mother    ??? Depression Mother    ??? Cancer Brother         Lung Cancer   ??? Heart Disease Brother         Heart Stents       SOCIAL HISTORY  Social History     Socioeconomic History   ??? Marital status: Married     Spouse name: None   ??? Number of children: None   ??? Years of education: None   ???  Highest education level: None   Occupational History   ??? None   Social Needs   ??? Financial resource strain: None   ??? Food insecurity     Worry: None     Inability: None   ??? Transportation needs     Medical: None     Non-medical: None   Tobacco Use   ??? Smoking status: Former Smoker     Packs/day: 0.50     Years: 2.00     Pack years: 1.00     Types: Cigarettes     Start date: 1996     Quit date: 1998     Years since quitting: 23.3   ??? Smokeless tobacco: Never Used   Substance and Sexual Activity   ??? Alcohol use: Yes     Comment: "Occ. Maybe Once A Week"   ??? Drug use: No   ??? Sexual activity: Yes     Partners: Female   Lifestyle   ??? Physical activity     Days per week: None     Minutes per session: None   ??? Stress: None   Relationships   ??? Social Product manager on phone: None     Gets together: None     Attends religious service: None     Active member of club or organization: None     Attends meetings of clubs or organizations: None     Relationship status: None   ??? Intimate  partner violence     Fear of current or ex partner: None     Emotionally abused: None     Physically abused: None     Forced sexual activity: None   Other Topics Concern   ??? None   Social History Narrative   ??? None       SURGICAL HISTORY  Past Surgical History:   Procedure Laterality Date   ??? ABDOMINAL AORTIC ANEURYSM REPAIR, ENDOVASCULAR  05/12/2017   ??? APPENDECTOMY  1970   ??? COLONOSCOPY  Last Done In 2000's    Polyps Removed In Past   ??? HERNIA REPAIR     ??? JOINT REPLACEMENT Left 2017    Total Left Shoulder   ??? VASECTOMY         CURRENT MEDICATIONS  No current facility-administered medications on file prior to encounter.      Current Outpatient Medications on File Prior to Encounter   Medication Sig Dispense Refill   ??? acetaminophen (TYLENOL) 325 MG tablet Take 1 tablet by mouth every 4 hours as needed for Pain 120 tablet 3   ??? aspirin 81 MG EC tablet Take 81 mg by mouth every morning Over The Counter     ??? UNABLE TO FIND Take by mouth 2 times daily Over The Counter Osteo Bi Flex     ??? metoprolol tartrate (LOPRESSOR) 25 MG tablet Take 25 mg by mouth 2 times daily     ??? traZODone (DESYREL) 100 MG tablet Take 100 mg by mouth nightly           ALLERGIES  No Known Allergies    PHYSICAL EXAM  VITAL SIGNS: BP (!) 188/93    Pulse 84    Temp 98.2 ??F (36.8 ??C) (Oral)    Resp 20    Ht 5\' 11"  (1.803 m)    Wt 188 lb (85.3 kg)    SpO2 93%    BMI 26.22 kg/m??   Constitutional: Well developed, Well nourished, No acute distress, Non-toxic  appearance.   HENT: Normocephalic, Atraumatic, Bilateral external ears normal, Oropharynx moist, No oral exudates, Nose normal.   Eyes: PERRL, EOMI, Conjunctiva normal, No discharge.   Neck: Normal range of motion, Supple, No stridor.   Cardiovascular: Normal heart rate, Normal rhythm, No murmurs, No rubs, No gallops.   Thorax & Lungs: Normal breath sounds, No respiratory distress, No wheezing, No chest tenderness.   Abdomen: Bowel sounds normal, Soft, No tenderness, no guarding, no rebound, No  masses, No pulsatile masses.   Skin: Warm, Dry, No erythema, No rash.   Extremities: Intact distal pulses, No edema, No tenderness, No cyanosis, No clubbing.   Musculoskeletal: Good gross range of motion in all major joints. No major deformities noted.   Neurologic: Alert & oriented to person and place, not time, Normal gross motor function, Normal gross sensory function, No focal deficits noted.  Able to discuss remote events such as when he lived in [place] previously but does not recall why he is moving to [facility] currently  Psychiatric: Affect normal    EKG  EKG Interpretation    Interpreted by emergency department physician  April 24 at 1415    Rhythm: normal sinus   Rate: normal  Axis: left  Ectopy: none  Conduction: normal  ST Segments: no acute change  T Waves: no acute change  Q Waves: none    Clinical Impression: no acute changes, rate of 87, QTc 471    Kamren Heskett      RADIOLOGY/PROCEDURES/LABS  Last Imaging results   CT Head WO Contrast   Final Result   No acute intracranial abnormality.      Severe chronic white matter microangiopathic ischemic changes and age-related   cerebral atrophy.         XR CHEST PORTABLE   Final Result   No acute process.             Imaging reviewed by myself    Labs Reviewed   CBC WITH AUTO DIFFERENTIAL - Abnormal; Notable for the following components:       Result Value    RBC 3.48 (*)     Hemoglobin 11.5 (*)     Hematocrit 34.1 (*)     MCH 33.0 (*)     Segs Relative 71.5 (*)     Lymphocytes % 19.3 (*)     Monocytes % 7.8 (*)     All other components within normal limits   COMPREHENSIVE METABOLIC PANEL W/ REFLEX TO MG FOR LOW K - Abnormal; Notable for the following components:    Glucose 124 (*)     All other components within normal limits   URINALYSIS - Abnormal; Notable for the following components:    Mucus, UA RARE (*)     All other components within normal limits   TROPONIN         Medications   metoprolol (LOPRESSOR) injection 5 mg (5 mg Intravenous Given 03/14/20  1449)       COURSE & MEDICAL DECISION MAKING  Pertinent Labs & Imaging studies reviewed. (See chart for details)    86 year old male presents with forgetfulness.  Here he is neurologically nonfocal, he is forgetful about president and date but is otherwise able to tell me what he ate for breakfast, he is able to tell me about his family, he is able to tell me about his baseline medications, his interactions with previous doctors.  His head CT does not show evidence of mass or ICH, have low clinical suspicion  for acute CVA with very specific forgetfulness findings.  His work-up for metabolic here is reassuring.  He does have elevated blood pressure, and based on his normal blood pressures being systolic 0000000, there is question of whether or not he mixed up his Ativan and metoprolol this morning.  He is advised to take his metoprolol when he gets home, to follow-up with neurology and primary care for recheck.  As he is currently feeling "better than baseline" we will discharge him home with strict return precautions put in place.  Family feels comfortable this plan of care.    No sign of acute reversible reason for forgetfulness such as UTI, pneumonia or metabolic derangement.    FINAL IMPRESSION  Problem List Items Addressed This Visit     None      Visit Diagnoses     Forgetfulness    -  Primary      1.    2.   3.    Patient gave me permission to discuss medical history, care, and plan with those present in the room.  Electronically signed by: Lysbeth Penner, MD, 03/14/2020  Lysbeth Penner, MD        Lysbeth Penner, MD  03/14/20 [phone]

## 2020-03-14 NOTE — Discharge Instructions (Signed)
As we discussed, we completed a head CT, laboratory studies and nothing clearly showed why he is having the forgetfulness today.  Is possible he confused his medications, less consideration that this is related to his blood pressure.  Please keep a blood pressure log for the next week.  Call both your primary care as well as the neurologist to follow-up as an outpatient for the confusion.  Return with any worsening signs or symptoms.  Make sure you take your Lopressor when you home today.

## 2020-07-09 NOTE — Telephone Encounter (Signed)
Pts wife called, stated that they have moved to the [facility] area & would not be coming back to Dr. Ritta Slot, she asked what testing pt was due for, per Dr. Ritta Slot, pt due for f/u of EVAR & either CTA or [place].   I mailed orders & last OV to visit to pt per request, he has an initial appt with a PCP in October. I advised pt that he needs to be referred to heart doctor locally & make sure follow ups & testing are done, advised to sign a records release & have it sent to [place] so all records can be transferred. Pts wife voiced understanding. Electronically signed by Darliss Ridgel, MA on 07/09/2020 at 9:34 AM

## 2022-06-14 ENCOUNTER — Encounter

## 2022-07-11 ENCOUNTER — Inpatient Hospital Stay: Admit: 2022-07-11 | Payer: MEDICARE | Attending: Surgery | Primary: Family Medicine

## 2022-07-11 DIAGNOSIS — I7143 Infrarenal abdominal aortic aneurysm, without rupture: Secondary | ICD-10-CM

## 2022-07-12 LAB — VAS DUP ABDOMINAL AORTA
Ao dist AP: 3.78 cm
Ao dist PSV: 21.2 cm/s
Ao dist TR: 3.76 cm
Ao mid AP: 3.3 cm
Ao mid PSV: 31.1 cm/s
Ao mid TR: 2.99 cm
Ao prox AP: 2.89 cm
Ao prox PSV: 35.4 cm/s
Ao prox TR: 2.86 cm
Left CIA AP: 1.89 cm
Left CIA prox PSV: 142 cm/s
Right CIA AP: 2.02 cm
Right CIA prox PSV: 37 cm/s

## 2022-07-26 NOTE — Telephone Encounter (Signed)
Spoke to patients wife and changed appointment with Dr. Leland Her on 07/27/2022 from 11:30am to 11:00 am

## 2022-07-27 ENCOUNTER — Ambulatory Visit: Admit: 2022-07-27 | Discharge: 2022-07-27 | Payer: MEDICARE | Attending: Surgery | Primary: Family Medicine

## 2022-07-27 DIAGNOSIS — I7143 Infrarenal abdominal aortic aneurysm, without rupture: Secondary | ICD-10-CM

## 2022-07-27 NOTE — Progress Notes (Signed)
[facility]  [address]  [place] [address]    Leon Nguyen  Apr 03, 1931  88 y.o.male       Chief Complaint:  Chief Complaint   Patient presents with    Follow-up     1 year follow up abdominal aortic aneurysm        History of present Illness:  Pt is here today for surveillance for his endovascular abdominal aortic aneurysm repair.  This is an 88 year old male who have been following for about 5 years.  He had his aneurysm repaired when he was out of town.  I have reviewed his most recent aortic ultrasound which shows no evidence of endoleak and a small residual aneurysm sac.  Given the patient's age I think he can safely be followed every 2 years.  He has no growth in the aneurysm and no endoleak's whatsoever.  He is otherwise been doing well.      Past Medical History:  has a past medical history of AAA (abdominal aortic aneurysm) ([facility]), Dental crowns present, Enlarged prostate, HOH (hard of hearing), Hyperlipidemia, Hypertension, Nocturia, Shingles, Shortness of breath on exertion, Wears glasses, and Wears hearing aid.    Past Surgical History:   Past Surgical History:   Procedure Laterality Date    ABDOMINAL AORTIC ANEURYSM REPAIR, ENDOVASCULAR  05/12/2017    APPENDECTOMY  1970    COLONOSCOPY  Last Done In 2000's    Polyps Removed In Past    HERNIA REPAIR      JOINT REPLACEMENT Left 2017    Total Left Shoulder    VASECTOMY         Social History:  reports that he quit smoking about 25 years ago. His smoking use included cigarettes. He started smoking about 27 years ago. He has a 1.00 pack-year smoking history. He has never used smokeless tobacco. He reports current alcohol use. He reports that he does not use drugs.    Family History: family history includes Anxiety Disorder in his mother; Cancer in his brother; Depression in his mother; Heart Disease in his brother; Thyroid Disease in  his mother.    Review of Systems:   Constitutional:  negative for  fevers, chills, sweats, fatigue, and weight loss  HEENT:  negative for vision or hearing changes,   Respiratory:  negative for shortness of breath, cough, or congestion  Cardiovascular:  negative for  chest pain, palpitations  Gastrointestinal:  negative for nausea, vomiting, diarrhea, constipation, abdominal pain  Genitourinary:  negative for frequency, dysuria  Integument/Breast:  negative for rash, skin lesions  Musculoskeletal:  negative for muscle aches or joint pain  Neurological:  negative for headaches, dizziness, lightheadedness, numbness, pain and tingling extremities  Behavior/Psych:  negative for depression and anxiety    Allergies: Patient has no known allergies.    Current Meds:  Current Outpatient Medications:     amLODIPine (NORVASC) 5 MG tablet, Take 1 tablet by mouth daily, Disp: , Rfl:     sertraline (ZOLOFT) 25 MG tablet, Take 1 tablet by mouth daily, Disp: , Rfl:     memantine (NAMENDA) 5 MG tablet, Take 1 tablet by mouth 2 times daily, Disp: , Rfl:     UNABLE TO FIND, Take by mouth 2 times daily Over The Counter Osteo Bi Flex, Disp: , Rfl:     metoprolol tartrate (LOPRESSOR) 25 MG tablet, Take 1 tablet by mouth  2 times daily, Disp: , Rfl:     traZODone (DESYREL) 100 MG tablet, Take 1 tablet by mouth nightly, Disp: , Rfl:     acetaminophen (TYLENOL) 325 MG tablet, Take 1 tablet by mouth every 4 hours as needed for Pain (Patient not taking: Reported on 07/27/2022), Disp: 120 tablet, Rfl: 3    aspirin 81 MG EC tablet, Take 81 mg by mouth every morning Over The Counter (Patient not taking: Reported on 07/27/2022), Disp: , Rfl:     Vital Signs:  Vitals:    07/27/22 1050   BP: 117/63   Pulse: 71   Resp: 18   Temp: 98 F (36.7 C)   SpO2: 95%       Physical Exam:  General appearance - alert, well appearing and in no acute distress  Mental status - oriented to person, place and time with normal affect  Head - normocephalic and  atraumatic  Eyes - pupils equal and reactive, extraocular eye movements intact, conjunctiva clear  Ears - hearing appears to be intact  Nose - no drainage noted  Mouth - mucous membranes moist  Neck - supple, no carotid bruits, thyroid not palpable, no JVD  Chest - clear to auscultation, normal effort  Heart - normal rate, regular rhythm, no murmurs  Abdomen - soft, non-tender, non-distended, bowel sounds present all four quadrants, no masses, hepatomegaly, splenomegaly or aortic enlargement  Neurological - normal speech, no focal findings or movement disorder noted, cranial nerves II through XII grossly intact  Extremities - peripheral pulses palpable, no pedal edema or calf pain with palpation  Skin - no gross lesions, rashes, or induration noted      R brachial 2+ L brachial 2+   R radial 2+ L radial 2+   R femoral 2+ L femoral 2+   R popliteal 2+ L popliteal 2+   R posterior tibial 2+ L posterior tibial 2+   R dorsalis pedis 2+ L dorsalis pedis 2+     Labs:   Lab Results   Component Value Date/Time    WBC 5.0 03/14/2020 02:34 PM    HGB 11.5 03/14/2020 02:34 PM    HCT 34.1 03/14/2020 02:34 PM    MCV 98.0 03/14/2020 02:34 PM    PLT 143 03/14/2020 02:34 PM     Lab Results   Component Value Date/Time    NA 136 03/14/2020 02:34 PM    K 3.9 03/14/2020 02:34 PM    CL 101 03/14/2020 02:34 PM    CO2 30 03/14/2020 02:34 PM    BUN 21 03/14/2020 02:34 PM    CREATININE 1.1 03/14/2020 02:34 PM    GLUCOSE 124 03/14/2020 02:34 PM    CALCIUM 8.9 03/14/2020 02:34 PM     Lab Results   Component Value Date/Time    ALKPHOS 72 03/14/2020 02:34 PM    ALT 20 03/14/2020 02:34 PM    AST 21 03/14/2020 02:34 PM    PROT 6.6 03/14/2020 02:34 PM    BILITOT 0.5 03/14/2020 02:34 PM    LABALBU 4.2 03/14/2020 02:34 PM     No results found for: LACTA  No results found for: AMYLASE  No results found for: LIPASE  Lab Results   Component Value Date/Time    INR 1.12 05/04/2017 10:45 AM         Assessment:  88 year old male status post endovascular  repair of abdominal aortic aneurysm    1. Infrarenal abdominal aortic aneurysm (AAA) without rupture ([place])  Plan:   Follow-up in 2 years with aortic ultrasound.    No orders of the defined types were placed in this encounter.          Electronically signed by Tracie Harrier, MD on 07/27/2022 at 11:06 AM     Copy sent to Dr. Patrina Levering, MD

## 2023-04-22 DEATH — deceased

## 2024-07-26 NOTE — Telephone Encounter (Signed)
 Attempted to call patient the two phone numbers listed in the chart are no good. Also tried to have another co worker dial the numbers and did not work for her either. Please update  patients pone number
